# Patient Record
Sex: Female | Born: 1935 | Race: White | Hispanic: No | Smoking: Never smoker
Health system: Southern US, Community
[De-identification: ages and names within clinical notes are randomized; demographics above are authoritative.]

## PROBLEM LIST (undated history)

## (undated) DIAGNOSIS — M1990 Unspecified osteoarthritis, unspecified site: Secondary | ICD-10-CM

## (undated) DIAGNOSIS — J449 Chronic obstructive pulmonary disease, unspecified: Secondary | ICD-10-CM

## (undated) DIAGNOSIS — A809 Acute poliomyelitis, unspecified: Secondary | ICD-10-CM

## (undated) DIAGNOSIS — E785 Hyperlipidemia, unspecified: Secondary | ICD-10-CM

## (undated) DIAGNOSIS — F32A Depression, unspecified: Secondary | ICD-10-CM

## (undated) DIAGNOSIS — M810 Age-related osteoporosis without current pathological fracture: Secondary | ICD-10-CM

## (undated) DIAGNOSIS — H1851 Endothelial corneal dystrophy: Secondary | ICD-10-CM

## (undated) DIAGNOSIS — G43909 Migraine, unspecified, not intractable, without status migrainosus: Secondary | ICD-10-CM

## (undated) DIAGNOSIS — H409 Unspecified glaucoma: Secondary | ICD-10-CM

## (undated) DIAGNOSIS — M419 Scoliosis, unspecified: Secondary | ICD-10-CM

## (undated) DIAGNOSIS — F0390 Unspecified dementia without behavioral disturbance: Secondary | ICD-10-CM

## (undated) DIAGNOSIS — IMO0002 Reserved for concepts with insufficient information to code with codable children: Secondary | ICD-10-CM

## (undated) DIAGNOSIS — H18519 Endothelial corneal dystrophy, unspecified eye: Secondary | ICD-10-CM

## (undated) DIAGNOSIS — M797 Fibromyalgia: Secondary | ICD-10-CM

## (undated) DIAGNOSIS — F329 Major depressive disorder, single episode, unspecified: Secondary | ICD-10-CM

## (undated) HISTORY — DX: Endothelial corneal dystrophy: H18.51

## (undated) HISTORY — DX: Depression, unspecified: F32.A

## (undated) HISTORY — PX: OTHER SURGICAL HISTORY: SHX169

## (undated) HISTORY — DX: Unspecified osteoarthritis, unspecified site: M19.90

## (undated) HISTORY — DX: Acute poliomyelitis, unspecified: A80.9

## (undated) HISTORY — DX: Hyperlipidemia, unspecified: E78.5

## (undated) HISTORY — DX: Unspecified glaucoma: H40.9

## (undated) HISTORY — DX: Fibromyalgia: M79.7

## (undated) HISTORY — DX: Reserved for concepts with insufficient information to code with codable children: IMO0002

## (undated) HISTORY — DX: Chronic obstructive pulmonary disease, unspecified: J44.9

## (undated) HISTORY — DX: Endothelial corneal dystrophy, unspecified eye: H18.519

## (undated) HISTORY — DX: Major depressive disorder, single episode, unspecified: F32.9

## (undated) HISTORY — DX: Unspecified dementia, unspecified severity, without behavioral disturbance, psychotic disturbance, mood disturbance, and anxiety: F03.90

## (undated) HISTORY — DX: Scoliosis, unspecified: M41.9

## (undated) HISTORY — DX: Migraine, unspecified, not intractable, without status migrainosus: G43.909

## (undated) HISTORY — DX: Age-related osteoporosis without current pathological fracture: M81.0

## (undated) HISTORY — PX: TUBAL LIGATION: SHX77

---

## 1998-04-08 ENCOUNTER — Other Ambulatory Visit: Admission: RE | Admit: 1998-04-08 | Discharge: 1998-04-08 | Payer: Self-pay | Admitting: Obstetrics and Gynecology

## 1998-05-26 ENCOUNTER — Ambulatory Visit (HOSPITAL_COMMUNITY): Admission: RE | Admit: 1998-05-26 | Discharge: 1998-05-26 | Payer: Self-pay | Admitting: Obstetrics & Gynecology

## 1998-08-19 ENCOUNTER — Emergency Department (HOSPITAL_COMMUNITY): Admission: EM | Admit: 1998-08-19 | Discharge: 1998-08-19 | Payer: Self-pay

## 1998-09-14 ENCOUNTER — Ambulatory Visit (HOSPITAL_COMMUNITY): Admission: RE | Admit: 1998-09-14 | Discharge: 1998-09-14 | Payer: Self-pay | Admitting: Pediatrics

## 1999-01-24 ENCOUNTER — Encounter: Payer: Self-pay | Admitting: Family Medicine

## 1999-01-24 ENCOUNTER — Encounter: Admission: RE | Admit: 1999-01-24 | Discharge: 1999-01-24 | Payer: Self-pay | Admitting: Family Medicine

## 1999-07-14 ENCOUNTER — Other Ambulatory Visit: Admission: RE | Admit: 1999-07-14 | Discharge: 1999-07-14 | Payer: Self-pay | Admitting: Obstetrics and Gynecology

## 1999-09-20 ENCOUNTER — Encounter: Admission: RE | Admit: 1999-09-20 | Discharge: 1999-09-20 | Payer: Self-pay | Admitting: Family Medicine

## 1999-09-20 ENCOUNTER — Encounter: Payer: Self-pay | Admitting: Family Medicine

## 1999-11-10 ENCOUNTER — Ambulatory Visit (HOSPITAL_COMMUNITY): Admission: RE | Admit: 1999-11-10 | Discharge: 1999-11-10 | Payer: Self-pay | Admitting: Gastroenterology

## 2000-11-26 ENCOUNTER — Other Ambulatory Visit: Admission: RE | Admit: 2000-11-26 | Discharge: 2000-11-26 | Payer: Self-pay | Admitting: Obstetrics and Gynecology

## 2002-01-21 ENCOUNTER — Other Ambulatory Visit: Admission: RE | Admit: 2002-01-21 | Discharge: 2002-01-21 | Payer: Self-pay | Admitting: Obstetrics and Gynecology

## 2002-11-10 ENCOUNTER — Ambulatory Visit (HOSPITAL_COMMUNITY): Admission: RE | Admit: 2002-11-10 | Discharge: 2002-11-10 | Payer: Self-pay | Admitting: Orthopedic Surgery

## 2002-11-10 ENCOUNTER — Encounter: Payer: Self-pay | Admitting: Orthopedic Surgery

## 2003-09-29 ENCOUNTER — Ambulatory Visit (HOSPITAL_COMMUNITY): Admission: RE | Admit: 2003-09-29 | Discharge: 2003-09-29 | Payer: Self-pay | Admitting: Gastroenterology

## 2003-09-29 ENCOUNTER — Encounter (INDEPENDENT_AMBULATORY_CARE_PROVIDER_SITE_OTHER): Payer: Self-pay | Admitting: *Deleted

## 2003-09-29 ENCOUNTER — Encounter: Payer: Self-pay | Admitting: Family Medicine

## 2005-02-23 ENCOUNTER — Emergency Department (HOSPITAL_COMMUNITY): Admission: EM | Admit: 2005-02-23 | Discharge: 2005-02-24 | Payer: Self-pay | Admitting: Emergency Medicine

## 2005-03-05 ENCOUNTER — Encounter: Admission: RE | Admit: 2005-03-05 | Discharge: 2005-03-05 | Payer: Self-pay | Admitting: Orthopedic Surgery

## 2005-07-14 ENCOUNTER — Encounter: Payer: Self-pay | Admitting: Family Medicine

## 2005-09-21 ENCOUNTER — Ambulatory Visit: Payer: Self-pay | Admitting: Family Medicine

## 2006-01-17 ENCOUNTER — Encounter: Payer: Self-pay | Admitting: Family Medicine

## 2006-09-06 ENCOUNTER — Encounter: Payer: Self-pay | Admitting: Family Medicine

## 2006-10-10 DIAGNOSIS — E785 Hyperlipidemia, unspecified: Secondary | ICD-10-CM

## 2006-10-10 DIAGNOSIS — F329 Major depressive disorder, single episode, unspecified: Secondary | ICD-10-CM

## 2007-05-21 ENCOUNTER — Ambulatory Visit: Payer: Self-pay | Admitting: Family Medicine

## 2007-05-21 LAB — CONVERTED CEMR LAB
Bilirubin Urine: NEGATIVE
Glucose, Urine, Semiquant: NEGATIVE
Ketones, urine, test strip: NEGATIVE
Nitrite: NEGATIVE
Protein, U semiquant: NEGATIVE
Specific Gravity, Urine: 1.01
Urobilinogen, UA: 0.2
pH: 7

## 2007-05-27 LAB — CONVERTED CEMR LAB
ALT: 32 units/L (ref 0–35)
AST: 31 units/L (ref 0–37)
Albumin: 4.3 g/dL (ref 3.5–5.2)
Alkaline Phosphatase: 56 units/L (ref 39–117)
BUN: 18 mg/dL (ref 6–23)
Basophils Absolute: 0.1 10*3/uL (ref 0.0–0.1)
Basophils Relative: 0.9 % (ref 0.0–1.0)
Bilirubin, Direct: 0.3 mg/dL (ref 0.0–0.3)
CO2: 29 meq/L (ref 19–32)
Calcium: 10 mg/dL (ref 8.4–10.5)
Chloride: 104 meq/L (ref 96–112)
Cholesterol: 185 mg/dL (ref 0–200)
Creatinine, Ser: 0.8 mg/dL (ref 0.4–1.2)
Eosinophils Absolute: 0.4 10*3/uL (ref 0.0–0.6)
Eosinophils Relative: 6.4 % — ABNORMAL HIGH (ref 0.0–5.0)
GFR calc Af Amer: 90 mL/min
GFR calc non Af Amer: 74 mL/min
Glucose, Bld: 107 mg/dL — ABNORMAL HIGH (ref 70–99)
HCT: 42.7 % (ref 36.0–46.0)
HDL: 60.2 mg/dL (ref >39.0)
Hemoglobin: 14.4 g/dL (ref 12.0–15.0)
LDL Cholesterol: 104 mg/dL — ABNORMAL HIGH (ref 0–99)
Lymphocytes Relative: 23.2 % (ref 12.0–46.0)
MCHC: 33.7 g/dL (ref 30.0–36.0)
MCV: 93.9 fL (ref 78.0–100.0)
Monocytes Absolute: 0.5 10*3/uL (ref 0.2–0.7)
Monocytes Relative: 7.7 % (ref 3.0–11.0)
Neutro Abs: 4.1 10*3/uL (ref 1.4–7.7)
Neutrophils Relative %: 61.8 % (ref 43.0–77.0)
Platelets: 253 10*3/uL (ref 150–400)
Potassium: 4.1 meq/L (ref 3.5–5.1)
RBC: 4.55 M/uL (ref 3.87–5.11)
RDW: 12 % (ref 11.5–14.6)
Sodium: 141 meq/L (ref 135–145)
TSH: 1.34 microintl units/mL (ref 0.35–5.50)
Total Bilirubin: 1.2 mg/dL (ref 0.3–1.2)
Total CHOL/HDL Ratio: 3.1
Total Protein: 7.3 g/dL (ref 6.0–8.3)
Triglycerides: 102 mg/dL (ref 0–149)
VLDL: 20 mg/dL (ref 0–40)
Vit D, 1,25-Dihydroxy: 25 — ABNORMAL LOW (ref 30–89)
WBC: 6.6 10*3/uL (ref 4.5–10.5)

## 2007-08-20 ENCOUNTER — Encounter: Payer: Self-pay | Admitting: Family Medicine

## 2007-08-21 ENCOUNTER — Encounter: Payer: Self-pay | Admitting: Family Medicine

## 2007-08-22 ENCOUNTER — Ambulatory Visit: Payer: Self-pay | Admitting: Family Medicine

## 2007-08-22 ENCOUNTER — Telehealth: Payer: Self-pay | Admitting: Family Medicine

## 2008-08-19 ENCOUNTER — Ambulatory Visit: Payer: Self-pay | Admitting: Family Medicine

## 2008-10-08 ENCOUNTER — Encounter: Payer: Self-pay | Admitting: Family Medicine

## 2008-10-29 ENCOUNTER — Encounter: Payer: Self-pay | Admitting: Family Medicine

## 2009-06-21 ENCOUNTER — Ambulatory Visit: Payer: Self-pay | Admitting: Family Medicine

## 2009-07-07 ENCOUNTER — Encounter: Payer: Self-pay | Admitting: Family Medicine

## 2009-11-11 ENCOUNTER — Encounter: Payer: Self-pay | Admitting: Family Medicine

## 2010-03-27 LAB — CONVERTED CEMR LAB
ALT: 24 units/L (ref 0–35)
AST: 25 units/L (ref 0–37)
Alkaline Phosphatase: 47 units/L (ref 39–117)
BUN: 16 mg/dL (ref 6–23)
Bilirubin Urine: NEGATIVE
Bilirubin, Direct: 0.1 mg/dL (ref 0.0–0.3)
Cholesterol: 182 mg/dL (ref 0–200)
Creatinine, Ser: 0.7 mg/dL (ref 0.4–1.2)
Eosinophils Relative: 4.6 % (ref 0.0–5.0)
GFR calc non Af Amer: 86.15 mL/min (ref >60)
LDL Cholesterol: 95 mg/dL (ref 0–99)
Monocytes Relative: 8.2 % (ref 3.0–12.0)
Neutrophils Relative %: 56.2 % (ref 43.0–77.0)
Nitrite: NEGATIVE
Platelets: 254 10*3/uL (ref 150.0–400.0)
Potassium: 3.9 meq/L (ref 3.5–5.1)
RBC: 4.15 M/uL (ref 3.87–5.11)
TSH: 0.99 microintl units/mL (ref 0.35–5.50)
Total Bilirubin: 1.3 mg/dL — ABNORMAL HIGH (ref 0.3–1.2)
Total CHOL/HDL Ratio: 3
Triglycerides: 106 mg/dL (ref 0.0–149.0)
Urobilinogen, UA: 4
VLDL: 21.2 mg/dL (ref 0.0–40.0)
Vit D, 25-Hydroxy: 21 ng/mL — ABNORMAL LOW (ref 30–89)
WBC: 5 10*3/uL (ref 4.5–10.5)

## 2010-03-31 NOTE — Assessment & Plan Note (Signed)
Summary: lft hip pain/worse when standing and walking/cjr   Vital Signs:  Patient profile:   77 year old female Weight:      144 pounds BMI:     27.31 Temp:     98.6 degrees F oral BP sitting:   96 / 66  (left arm) Cuff size:   regular  Vitals Entered By: Raechel Ache, RN (June 21, 2009 4:06 PM) CC: C/o L hip pain worsening- does water aerobics and maybe "overdid it"   History of Present Illness: Here for one week of left hip pain which is mostly in the lateral and anterior hip areas. She is stiff and sore when she gets out of bed or up out of a chair, but then loosens up and the pain gets better when she moves around. No trauma, but she has been active in water aerobics classes at the [facility] twice a week for several months. She says she works very hard during these classes and possibly pushes herself too much. No back pain. She is already on Voltaren and Lyrica for arthritis pains diffusely.   Allergies (verified): No Known Drug Allergies  Past History:  Past Medical History: Reviewed history from 10/10/2006 and no changes required. Polio - PPS Arthritis Fibromyalgia Depression Hyperlipidemia Ulcers small - stomach High Cholesterol Migraines  Past Surgical History: Reviewed history from 10/10/2006 and no changes required. Inguinal herniorrhaphy Tubal ligation  Review of Systems  The patient denies anorexia, fever, weight loss, weight gain, vision loss, decreased hearing, hoarseness, chest pain, syncope, dyspnea on exertion, peripheral edema, prolonged cough, headaches, hemoptysis, abdominal pain, melena, hematochezia, severe indigestion/heartburn, hematuria, incontinence, genital sores, muscle weakness, suspicious skin lesions, transient blindness, difficulty walking, depression, unusual weight change, abnormal bleeding, enlarged lymph nodes, angioedema, breast masses, and testicular masses.    Physical Exam  General:  Well-developed,well-nourished,in no acute  distress; alert,appropriate and cooperative throughout examination. Gets up and down from the exam table easily. Msk:  No deformity or scoliosis noted of thoracic or lumbar spine.   Extremities:  very tender in the anterior left groin over the hip flexor tendons. The hip has full ROM and very little pain with internal or external rotations.   Impression & Recommendations:  Problem # 1:  HIP PAIN (ICD-719.45)  Her updated medication list for this problem includes:    Adult Aspirin Low Strength 81 Mg Tbdp (Aspirin)    Voltaren 75 Mg Tbec (Diclofenac sodium) .Marland Kitchen... 1 by mouth two times a day after meals for arthritis  Complete Medication List: 1)  Lexapro 20 Mg Tabs (Escitalopram oxalate) 2)  Bl Calcium 600 + Soy 600-200-25 Mg-unit-mg Tabs (Calcium carb-vit d-soy isoflav) 3)  Adult Aspirin Low Strength 81 Mg Tbdp (Aspirin) 4)  Voltaren 75 Mg Tbec (Diclofenac sodium) .Marland Kitchen.. 1 by mouth two times a day after meals for arthritis 5)  Folic Acid 1 Mg Tabs (Folic acid) 6)  Crestor 10 Mg Tabs (Rosuvastatin calcium) 7)  Vitamin D 16109 Unit Caps (Ergocalciferol) .... Take 1 capsule weekly for 12 weeks 8)  Lyrica 75 Mg Caps (Pregabalin) .Marland Kitchen.. 1 bid 9)  Imitrex 50 Mg Tabs (Sumatriptan succinate) .Marland Kitchen.. 1 by mouth at onset of headache 10)  Indapamide 2.5 Mg Tabs (Indapamide) .Marland Kitchen.. 1 once daily  as needed for edema  Patient Instructions: 1)  This is a hip flexor strain, probably related to water aerobics. Advised her to stop all exercising for 2 weeks to let this heal. Then she could resume water aerobics but very slowly and at  a very easy pace. Continue current meds.  2)  Please schedule a follow-up appointment as needed .

## 2010-03-31 NOTE — Miscellaneous (Signed)
Summary: Physical Therapy Initial Evaluation/[facility]   Physical Therapy Initial Evaluation/[facility]   Imported By: Maryln Gottron 10/27/2009 13:51:32  _____________________________________________________________________  External Attachment:    Type:   Image     Comment:   External Document

## 2010-04-17 ENCOUNTER — Inpatient Hospital Stay (HOSPITAL_COMMUNITY)
Admission: EM | Admit: 2010-04-17 | Discharge: 2010-04-19 | DRG: 556 | Disposition: A | Discharge status: Home / Self Care | Payer: MEDICARE | Attending: Internal Medicine | Admitting: Internal Medicine

## 2010-04-17 ENCOUNTER — Emergency Department (HOSPITAL_COMMUNITY): Payer: MEDICARE

## 2010-04-17 DIAGNOSIS — Z8612 Personal history of poliomyelitis: Secondary | ICD-10-CM

## 2010-04-17 DIAGNOSIS — Z66 Do not resuscitate: Secondary | ICD-10-CM | POA: Diagnosis present

## 2010-04-17 DIAGNOSIS — R209 Unspecified disturbances of skin sensation: Secondary | ICD-10-CM | POA: Diagnosis present

## 2010-04-17 DIAGNOSIS — F3289 Other specified depressive episodes: Secondary | ICD-10-CM | POA: Diagnosis present

## 2010-04-17 DIAGNOSIS — M810 Age-related osteoporosis without current pathological fracture: Secondary | ICD-10-CM | POA: Diagnosis present

## 2010-04-17 DIAGNOSIS — N390 Urinary tract infection, site not specified: Secondary | ICD-10-CM | POA: Diagnosis present

## 2010-04-17 DIAGNOSIS — I10 Essential (primary) hypertension: Secondary | ICD-10-CM | POA: Diagnosis present

## 2010-04-17 DIAGNOSIS — G609 Hereditary and idiopathic neuropathy, unspecified: Secondary | ICD-10-CM | POA: Diagnosis present

## 2010-04-17 DIAGNOSIS — H409 Unspecified glaucoma: Secondary | ICD-10-CM | POA: Diagnosis present

## 2010-04-17 DIAGNOSIS — R29898 Other symptoms and signs involving the musculoskeletal system: Principal | ICD-10-CM | POA: Diagnosis present

## 2010-04-17 DIAGNOSIS — F329 Major depressive disorder, single episode, unspecified: Secondary | ICD-10-CM | POA: Diagnosis present

## 2010-04-17 LAB — HEMOGLOBIN A1C: Mean Plasma Glucose: 131 mg/dL — ABNORMAL HIGH (ref <117)

## 2010-04-17 LAB — URINALYSIS, ROUTINE W REFLEX MICROSCOPIC
Bilirubin Urine: NEGATIVE
Hgb urine dipstick: NEGATIVE
Specific Gravity, Urine: 1.008 (ref 1.005–1.030)
Urine Glucose, Fasting: NEGATIVE mg/dL

## 2010-04-17 LAB — HEPATIC FUNCTION PANEL
AST: 23 U/L (ref 0–37)
Bilirubin, Direct: 0.1 mg/dL (ref 0.0–0.3)
Indirect Bilirubin: 0.4 mg/dL (ref 0.3–0.9)
Total Bilirubin: 0.5 mg/dL (ref 0.3–1.2)

## 2010-04-17 LAB — PROTIME-INR
INR: 1.14 (ref 0.00–1.49)
Prothrombin Time: 14.8 seconds (ref 11.6–15.2)

## 2010-04-17 LAB — DIFFERENTIAL
Basophils Absolute: 0 10*3/uL (ref 0.0–0.1)
Lymphocytes Relative: 31 % (ref 12–46)
Lymphs Abs: 1.8 10*3/uL (ref 0.7–4.0)
Neutro Abs: 3.3 10*3/uL (ref 1.7–7.7)
Neutrophils Relative %: 58 % (ref 43–77)

## 2010-04-17 LAB — URINE MICROSCOPIC-ADD ON

## 2010-04-17 LAB — BASIC METABOLIC PANEL
Calcium: 9.7 mg/dL (ref 8.4–10.5)
Creatinine, Ser: 0.69 mg/dL (ref 0.4–1.2)
GFR calc Af Amer: >60 mL/min (ref >60)
GFR calc non Af Amer: >60 mL/min (ref >60)

## 2010-04-17 LAB — POCT CARDIAC MARKERS
Myoglobin, poc: 110 ng/mL (ref 12–200)
Troponin i, poc: <0.05 ng/mL (ref 0.00–0.09)

## 2010-04-17 LAB — CBC
HCT: 43.4 % (ref 36.0–46.0)
Hemoglobin: 14.7 g/dL (ref 12.0–15.0)
RBC: 4.56 MIL/uL (ref 3.87–5.11)

## 2010-04-17 LAB — CK TOTAL AND CKMB (NOT AT ARMC)
CK, MB: 6.1 ng/mL (ref 0.3–4.0)
Total CK: 157 U/L (ref 7–177)

## 2010-04-18 ENCOUNTER — Inpatient Hospital Stay (HOSPITAL_COMMUNITY): Payer: MEDICARE

## 2010-04-18 DIAGNOSIS — R4789 Other speech disturbances: Secondary | ICD-10-CM

## 2010-04-18 DIAGNOSIS — G459 Transient cerebral ischemic attack, unspecified: Secondary | ICD-10-CM

## 2010-04-18 MED ORDER — GADOBENATE DIMEGLUMINE 529 MG/ML IV SOLN: 15.0000 mL | Freq: Once | INTRAVENOUS | Status: AC | PRN
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
## 2010-04-19 LAB — COMPREHENSIVE METABOLIC PANEL
Albumin: 3.3 g/dL — ABNORMAL LOW (ref 3.5–5.2)
Alkaline Phosphatase: 57 U/L (ref 39–117)
BUN: 16 mg/dL (ref 6–23)
Creatinine, Ser: 0.66 mg/dL (ref 0.4–1.2)
Potassium: 3.6 mEq/L (ref 3.5–5.1)
Total Protein: 6.3 g/dL (ref 6.0–8.3)

## 2010-04-19 LAB — CBC
MCV: 94.5 fL (ref 78.0–100.0)
Platelets: 250 10*3/uL (ref 150–400)
RDW: 12.3 % (ref 11.5–15.5)
WBC: 8.6 10*3/uL (ref 4.0–10.5)

## 2010-04-19 LAB — VITAMIN B12: Vitamin B-12: 247 pg/mL (ref 211–911)

## 2010-04-19 LAB — URINE CULTURE: Culture  Setup Time: 201202191949

## 2010-04-19 LAB — RPR: RPR Ser Ql: NONREACTIVE

## 2010-04-20 LAB — FOLATE RBC: RBC Folate: 364 ng/mL (ref 180–600)
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
## 2010-05-09 ENCOUNTER — Encounter: Payer: Self-pay | Admitting: Family Medicine

## 2010-05-19 ENCOUNTER — Ambulatory Visit (INDEPENDENT_AMBULATORY_CARE_PROVIDER_SITE_OTHER)
Admission: RE | Admit: 2010-05-19 | Discharge: 2010-05-19 | Disposition: A | Discharge status: Home / Self Care | Payer: MEDICARE | Source: Ambulatory Visit | Attending: Family Medicine | Admitting: Family Medicine

## 2010-05-19 ENCOUNTER — Encounter: Payer: Self-pay | Admitting: Family Medicine

## 2010-05-19 ENCOUNTER — Ambulatory Visit (INDEPENDENT_AMBULATORY_CARE_PROVIDER_SITE_OTHER): Payer: MEDICARE | Admitting: Family Medicine

## 2010-05-19 VITALS — BP 124/74 | HR 89 | Temp 98.6°F | Ht 61.5 in | Wt 140.0 lb

## 2010-05-19 DIAGNOSIS — M25521 Pain in right elbow: Secondary | ICD-10-CM

## 2010-05-19 DIAGNOSIS — G459 Transient cerebral ischemic attack, unspecified: Secondary | ICD-10-CM

## 2010-05-19 DIAGNOSIS — E876 Hypokalemia: Secondary | ICD-10-CM

## 2010-05-19 DIAGNOSIS — N390 Urinary tract infection, site not specified: Secondary | ICD-10-CM

## 2010-05-19 DIAGNOSIS — M25529 Pain in unspecified elbow: Secondary | ICD-10-CM

## 2010-05-19 DIAGNOSIS — E559 Vitamin D deficiency, unspecified: Secondary | ICD-10-CM

## 2010-05-19 DIAGNOSIS — D649 Anemia, unspecified: Secondary | ICD-10-CM

## 2010-05-19 DIAGNOSIS — E785 Hyperlipidemia, unspecified: Secondary | ICD-10-CM

## 2010-05-19 DIAGNOSIS — E039 Hypothyroidism, unspecified: Secondary | ICD-10-CM

## 2010-05-19 DIAGNOSIS — G14 Postpolio syndrome: Secondary | ICD-10-CM

## 2010-05-19 DIAGNOSIS — B91 Sequelae of poliomyelitis: Secondary | ICD-10-CM

## 2010-05-19 DIAGNOSIS — H409 Unspecified glaucoma: Secondary | ICD-10-CM

## 2010-05-19 LAB — CBC WITH DIFFERENTIAL/PLATELET
Basophils Relative: 0.7 % (ref 0.0–3.0)
Eosinophils Absolute: 0.2 10*3/uL (ref 0.0–0.7)
Eosinophils Relative: 3.1 % (ref 0.0–5.0)
Hemoglobin: 13.5 g/dL (ref 12.0–15.0)
Lymphocytes Relative: 27.4 % (ref 12.0–46.0)
MCHC: 34.5 g/dL (ref 30.0–36.0)
MCV: 95.1 fl (ref 78.0–100.0)
Neutro Abs: 3.2 10*3/uL (ref 1.4–7.7)
Neutrophils Relative %: 61.3 % (ref 43.0–77.0)
RBC: 4.12 Mil/uL (ref 3.87–5.11)
WBC: 5.2 10*3/uL (ref 4.5–10.5)

## 2010-05-19 LAB — HEPATIC FUNCTION PANEL
Bilirubin, Direct: 0.1 mg/dL (ref 0.0–0.3)
Total Bilirubin: 1.1 mg/dL (ref 0.3–1.2)
Total Protein: 6.6 g/dL (ref 6.0–8.3)

## 2010-05-19 LAB — LIPID PANEL
HDL: 66 mg/dL (ref >39.00)
LDL Cholesterol: 84 mg/dL (ref 0–99)
Total CHOL/HDL Ratio: 3
Triglycerides: 109 mg/dL (ref 0.0–149.0)

## 2010-05-19 LAB — BASIC METABOLIC PANEL
Calcium: 9.1 mg/dL (ref 8.4–10.5)
Creatinine, Ser: 0.5 mg/dL (ref 0.4–1.2)
GFR: 118.22 mL/min (ref >60.00)
Sodium: 140 mEq/L (ref 135–145)

## 2010-05-19 LAB — POCT URINALYSIS DIPSTICK
Glucose, UA: NEGATIVE
Spec Grav, UA: 1.02

## 2010-05-19 MED ORDER — ESCITALOPRAM OXALATE 20 MG PO TABS: 20.0000 mg | ORAL_TABLET | Freq: Every day | ORAL | Status: DC

## 2010-05-19 MED ORDER — DICLOFENAC SODIUM 75 MG PO TBEC: 75.0000 mg | DELAYED_RELEASE_TABLET | Freq: Two times a day (BID) | ORAL | Status: DC

## 2010-05-19 MED ORDER — IBANDRONATE SODIUM 150 MG PO TABS: 150.0000 mg | ORAL_TABLET | ORAL | Status: DC

## 2010-05-19 MED ORDER — SUMATRIPTAN SUCCINATE 50 MG PO TABS: 50.0000 mg | ORAL_TABLET | Freq: Every day | ORAL | Status: DC

## 2010-05-19 MED ORDER — INDAPAMIDE 2.5 MG PO TABS: 2.5000 mg | ORAL_TABLET | ORAL | Status: DC

## 2010-05-21 MED ORDER — CIPROFLOXACIN HCL 500 MG PO TABS: 500.0000 mg | ORAL_TABLET | Freq: Two times a day (BID) | ORAL | Status: AC
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
## 2010-05-22 NOTE — Patient Instructions (Signed)
My impression is that you're doing very well however you should be very cautious in regard to falling since you have fallen so many times here do not hesitate to use her cane and walker Did not climb ladders or get on a stool We'll call you the results of your lab studies as well as refill your medications
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
## 2010-06-17 ENCOUNTER — Other Ambulatory Visit (INDEPENDENT_AMBULATORY_CARE_PROVIDER_SITE_OTHER): Payer: MEDICARE | Admitting: Family Medicine

## 2010-06-17 DIAGNOSIS — N390 Urinary tract infection, site not specified: Secondary | ICD-10-CM

## 2010-06-17 LAB — POCT URINALYSIS DIPSTICK
Leukocytes, UA: NEGATIVE
Nitrite, UA: NEGATIVE
Urobilinogen, UA: 2
pH, UA: 6.5

## 2010-06-20 NOTE — Progress Notes (Signed)
Pt aware.

## 2010-06-28 ENCOUNTER — Encounter: Payer: Self-pay | Admitting: Family Medicine

## 2010-07-15 NOTE — Op Note (Signed)
[name], [name]                            ACCOUNT NO.:  [id number]   MEDICAL RECORD NO.:  [id number]                   PATIENT TYPE:  AMB   LOCATION:  ENDO                                 FACILITY:  [facility]   PHYSICIAN:  James L. Malon Kindle., M.D.          DATE OF BIRTH:  1929-11-10   DATE OF PROCEDURE:  09/29/2003  DATE OF DISCHARGE:                                 OPERATIVE REPORT   PROCEDURE:  Colonoscopy with biopsy.   MEDICATIONS:  Fentanyl 50 mcg, Versed 6 mg IV.   SCOPE:  Olympus pediatric colonoscope.   INDICATIONS FOR PROCEDURE:  A 77 year old without previous colonoscopic  evaluation who has had problems with recent onset of diarrhea, previously  been constipated and had multiple loose stools __________ has been nearly  incontinence but without actual bleeding.   DESCRIPTION OF PROCEDURE:  The procedure had been explained to the patient  and consent obtained. With the patient in the left lateral decubitus  position, the pediatric adjustable colonoscope was inserted and advance. We  were able to advance through an area in the sigmoid colon with fairly marked  diverticular disease. After we were able to advance pass this, the  colonoscope was advanced fairly rapidly to the cecum. The ileocecal valve  and appendiceal orifice were seen.  The scope was withdrawn and the cecum,  ascending colon, transverse colon and descending colon were seen well.  Multiple random biopsies were taken to look for microscopic colitis.  Again  extensive diverticular disease in the sigmoid colon but no polyps or other  lesions were seen. The rectum was free of polyps.  The scope was withdrawn.  The patient tolerated the procedure well. She was monitored, given  supplemental oxygen throughout.   ASSESSMENT:  1. Diarrhea of unclear cause.  2. Extensive diverticular disease.   PLAN:  Will check path and see back in the office in 4-6 weeks.     James L. Malon Kindle., M.D.    Waldron Session  D:  09/29/2003  T:  09/29/2003  Job:  [id number]   cc:   Sherry A. Rosalio Macadamia, M.D.  [address]  [place]  [place] [address]  Fax: [phone]   Ellin Saba., M.D.  [address] [place]  [place] [address]  Fax: [phone]

## 2010-07-15 NOTE — Procedures (Signed)
[place]  Patient:    Becky [name], Becky [name]                         MRN: [id number] Proc. Date: 11/10/99 Adm. Date:  [id number] Disc. Date: [id number] Attending:  Orland Mustard CC:         Zigmund Daniel, M.D.  Feliciana Rossetti, M.D.   Procedure Report  PROCEDURE:  Esophagogastroduodenoscopy and biopsy.  MEDICATIONS:  Hurricaine spray, fentanyl 60 mcg, Versed 6 mg IV.  INDICATION:  This patient has been found to have some gallbladder sludge has been set up to have gallbladder surgery.  Her symptoms have been somewhat atypical.  She has been on Relafen was not having any indigestion or heartburn and has clear dyspepsia.  DESCRIPTION OF PROCEDURE:  The procedure had been explained to the patient and consent obtained.  With the patient in the left lateral decubitus position, the Olympus video endoscope inserted blindly into the esophagus and advanced under direct visualization.  The stomach was entered.  There was a 0.5 cm prepyloric ulcer.  It was not actively bleeding and was quite shallow.  The duodenum was completely normal.  The scope was withdrawn back and the prepyloric ulcer identified.  There was streaky gastritis and erosions and inflammation in the antrum.  A biopsy was taken for rapid urease test for Helicobacter.  No other ulcers were seen.  The fundus and the cardia were seen well on the retroflex view and were found to be normal.  There was a hiatal hernia and two small erosions in the esophagus, slight redness consistent with mild esophagitis.  The proximal esophagus was normal.  The patient tolerated the procedure well, was maintained on low flow oxygen and pulse oximetry throughout the procedure with no obvious problem.  ASSESSMENT: 1. Gastric ulcer and erosions probably contributed to by the Relafen. 2. Ulcerative esophagitis.  PLAN:  Will check the CLOtest.  Will go ahead and give her Nexium and have her follow up in the  office in two months following her gallbladder surgery. DD:  11/10/99 TD:  11/11/99 Job: [id number]

## 2010-08-29 ENCOUNTER — Other Ambulatory Visit: Payer: Self-pay | Admitting: Family Medicine

## 2010-10-07 ENCOUNTER — Other Ambulatory Visit: Payer: Self-pay | Admitting: Family Medicine

## 2010-12-07 LAB — HM MAMMOGRAPHY: HM Mammogram: NORMAL

## 2011-04-28 ENCOUNTER — Encounter: Payer: Self-pay | Admitting: Family Medicine

## 2011-05-20 ENCOUNTER — Other Ambulatory Visit: Payer: Self-pay | Admitting: Family Medicine

## 2011-05-23 NOTE — Telephone Encounter (Signed)
Refill once only.  Needs new primary.

## 2011-05-23 NOTE — Telephone Encounter (Signed)
Pt has not established with new pcp.  Pt last seen 05/19/10.  Rx last filled the same day.  Pls advise.

## 2011-06-12 ENCOUNTER — Other Ambulatory Visit: Payer: Self-pay | Admitting: Family Medicine

## 2011-09-11 ENCOUNTER — Encounter: Payer: Self-pay | Admitting: Family

## 2011-09-11 ENCOUNTER — Ambulatory Visit (INDEPENDENT_AMBULATORY_CARE_PROVIDER_SITE_OTHER): Payer: Medicare Other | Admitting: Family

## 2011-09-11 VITALS — BP 102/74 | HR 64 | Wt 143.0 lb

## 2011-09-11 DIAGNOSIS — Z79899 Other long term (current) drug therapy: Secondary | ICD-10-CM

## 2011-09-11 DIAGNOSIS — M1990 Unspecified osteoarthritis, unspecified site: Secondary | ICD-10-CM

## 2011-09-11 DIAGNOSIS — F329 Major depressive disorder, single episode, unspecified: Secondary | ICD-10-CM

## 2011-09-11 DIAGNOSIS — E039 Hypothyroidism, unspecified: Secondary | ICD-10-CM

## 2011-09-11 DIAGNOSIS — F3289 Other specified depressive episodes: Secondary | ICD-10-CM

## 2011-09-11 DIAGNOSIS — M810 Age-related osteoporosis without current pathological fracture: Secondary | ICD-10-CM

## 2011-09-11 DIAGNOSIS — E7800 Pure hypercholesterolemia, unspecified: Secondary | ICD-10-CM

## 2011-09-11 LAB — LDL CHOLESTEROL, DIRECT: Direct LDL: 237.9 mg/dL

## 2011-09-11 LAB — LIPID PANEL: VLDL: 36.4 mg/dL (ref 0.0–40.0)

## 2011-09-11 LAB — BASIC METABOLIC PANEL
BUN: 14 mg/dL (ref 6–23)
Calcium: 9.3 mg/dL (ref 8.4–10.5)
Chloride: 106 mEq/L (ref 96–112)
Creatinine, Ser: 0.6 mg/dL (ref 0.4–1.2)
GFR: 100.18 mL/min (ref >60.00)

## 2011-09-11 MED ORDER — IBANDRONATE SODIUM 150 MG PO TABS: 150.0000 mg | ORAL_TABLET | ORAL | Status: DC

## 2011-09-11 NOTE — Patient Instructions (Addendum)

## 2011-09-11 NOTE — Progress Notes (Signed)
Subjective:    Patient ID: Becky [name], female    DOB: Jun 07, 1929, 78 y.o.   MRN: [id number]  HPI 78 year old WF is in today for a recheck of hypothyroidism, hyperlipidemia, depression, osteopenia, and osteoarthritis. Patient has stopped taking her cholesterol medicine and Boniva. She has a history of a fractured femur. She is currently taking Zoloft and doing well.    Review of Systems  Constitutional: Negative.   HENT: Negative.   Respiratory: Negative.   Cardiovascular: Negative.   Gastrointestinal: Negative.   Genitourinary: Negative.   Musculoskeletal: Positive for arthralgias.  Neurological: Negative.  Negative for headaches.  Hematological: Negative.   Psychiatric/Behavioral: Negative.    Past Medical History  Diagnosis Date  . Polio     PPS  . Arthritis   . Fibromyalgia   . Depression   . Hyperlipidemia   . Ulcer     small- stomach  . Migraines     History   Social History  . Marital Status: Divorced    Spouse Name: N/A    Number of Children: N/A  . Years of Education: N/A   Occupational History  . Not on file.   Social History Main Topics  . Smoking status: Never Smoker   . Smokeless tobacco: Never Used  . Alcohol Use: No  . Drug Use: No  . Sexually Active: No   Other Topics Concern  . Not on file   Social History Narrative  . No narrative on file    Past Surgical History  Procedure Date  . Inguinal herniorrhaphy   . Tubal ligation     Family History  Problem Relation Age of Onset  . Alcohol abuse      addiction   . Arthritis    . Colon cancer      1st degree relative <60  . Diabetes      1st degree relative  . Psychosis    . Arthritis Brother   . Cancer Brother     No Known Allergies  Current Outpatient Prescriptions on File Prior to Visit  Medication Sig Dispense Refill  . acetaminophen (TYLENOL) 500 MG tablet Take 500 mg by mouth every 6 (six) hours as needed.        Marland Kitchen aspirin 81 MG tablet Take 81 mg by mouth daily.         . Calcium Carb-Vit D-Soy Isoflav 600-200-25 MG-UNIT-MG TABS Take by mouth.        . carboxymethylcellulose (REFRESH PLUS) 0.5 % SOLN 1 drop.        . diclofenac (VOLTAREN) 75 MG EC tablet Take 1 tablet (75 mg total) by mouth 2 (two) times daily.  30 tablet  11  . ergocalciferol (VITAMIN D2) 50000 UNITS capsule Take 50,000 Units by mouth once a week. For 12 weeks        . mirtazapine (REMERON) 30 MG tablet Take 30 mg by mouth at bedtime.      . sertraline (ZOLOFT) 100 MG tablet Take 100 mg by mouth daily.      . travoprost, benzalkonium, (TRAVATAN) 0.004 % ophthalmic solution Place 1 drop into the right eye at bedtime.        . Ascorbic Acid (VITAMIN C) 500 MG tablet Take 500 mg by mouth daily.        Marland Kitchen aspirin 81 MG EC tablet Take 81 mg by mouth daily.        . indapamide (LOZOL) 2.5 MG tablet TAKE 1 TABLET BY MOUTH EVERY MORNING  AS NEEDED FOR EDEMA  30 tablet  0  . NON FORMULARY Restall; otc used for sleep, qhs       . pregabalin (LYRICA) 75 MG capsule Take 75 mg by mouth 2 (two) times daily.        . rivastigmine (EXELON) 4.6 mg/24hr Place 1 patch onto the skin daily.        . rosuvastatin (CRESTOR) 10 MG tablet Take 10 mg by mouth daily.        . SUMAtriptan (IMITREX) 50 MG tablet Take 1 tablet (50 mg total) by mouth daily. At onset of a headache  9 tablet  11    BP 102/74  Pulse 64  Wt 143 lb (64.864 kg)  SpO2 95%chart    Objective:   Physical Exam  Constitutional: She is oriented to person, place, and time. She appears well-developed and well-nourished.  HENT:  Right Ear: External ear normal.  Left Ear: External ear normal.  Nose: Nose normal.  Mouth/Throat: Oropharynx is clear and moist.  Neck: Normal range of motion. Neck supple.  Cardiovascular: Normal rate, regular rhythm and normal heart sounds.   Pulmonary/Chest: Effort normal and breath sounds normal.  Abdominal: Soft. Bowel sounds are normal.  Musculoskeletal: Normal range of motion.  Neurological: She is alert and  oriented to person, place, and time.  Skin: Skin is warm and dry.  Psychiatric: She has a normal mood and affect.          Assessment & Plan:  Assessment: Hypothyroidism, Osteoporosis, Depression, Osteoarthritis  Plan: Restart Boniva. BMP, lipids, TSH. Recheck patient pending results. Continue current meds.

## 2011-09-12 ENCOUNTER — Other Ambulatory Visit: Payer: Self-pay

## 2011-09-12 DIAGNOSIS — E785 Hyperlipidemia, unspecified: Secondary | ICD-10-CM

## 2011-09-12 MED ORDER — ROSUVASTATIN CALCIUM 10 MG PO TABS: 10.0000 mg | ORAL_TABLET | Freq: Every day | ORAL | Status: DC

## 2011-09-22 ENCOUNTER — Other Ambulatory Visit: Payer: Self-pay | Admitting: Family Medicine

## 2011-10-11 ENCOUNTER — Telehealth: Payer: Self-pay | Admitting: Family Medicine

## 2011-10-11 NOTE — Telephone Encounter (Signed)
Pt requesting refills.  Last seen to establish care with you on 09/11/11.  Please advise.

## 2011-10-12 ENCOUNTER — Other Ambulatory Visit: Payer: Self-pay | Admitting: Family Medicine

## 2011-10-12 MED ORDER — MIRTAZAPINE 30 MG PO TABS: 30.0000 mg | ORAL_TABLET | Freq: Every day | ORAL | Status: DC

## 2011-10-12 NOTE — Telephone Encounter (Signed)
Sent to the pharmacy by e-scribe. 

## 2011-10-12 NOTE — Telephone Encounter (Signed)
Ok to refills. 

## 2011-10-24 ENCOUNTER — Other Ambulatory Visit (INDEPENDENT_AMBULATORY_CARE_PROVIDER_SITE_OTHER): Payer: Medicare Other

## 2011-10-24 DIAGNOSIS — E785 Hyperlipidemia, unspecified: Secondary | ICD-10-CM

## 2011-10-24 LAB — LIPID PANEL
Cholesterol: 205 mg/dL — ABNORMAL HIGH (ref 0–200)
HDL: 69.1 mg/dL (ref >39.00)
VLDL: 27.2 mg/dL (ref 0.0–40.0)

## 2011-10-26 NOTE — Progress Notes (Signed)
Quick Note:  Attempted to call pt; will attempt again at a later time. ______

## 2011-10-31 NOTE — Progress Notes (Signed)
Quick Note:  Called and spoke with pt and pt is aware. ______ 

## 2012-03-06 ENCOUNTER — Ambulatory Visit: Payer: Medicare Other | Admitting: Family

## 2012-03-13 ENCOUNTER — Ambulatory Visit: Payer: Medicare Other | Admitting: Family

## 2012-03-20 ENCOUNTER — Encounter: Payer: Self-pay | Admitting: Family

## 2012-03-20 ENCOUNTER — Ambulatory Visit (INDEPENDENT_AMBULATORY_CARE_PROVIDER_SITE_OTHER): Payer: Medicare Other | Admitting: Family

## 2012-03-20 VITALS — BP 118/78 | HR 95 | Wt 136.0 lb

## 2012-03-20 DIAGNOSIS — E785 Hyperlipidemia, unspecified: Secondary | ICD-10-CM

## 2012-03-20 DIAGNOSIS — R413 Other amnesia: Secondary | ICD-10-CM

## 2012-03-20 DIAGNOSIS — F329 Major depressive disorder, single episode, unspecified: Secondary | ICD-10-CM

## 2012-03-20 DIAGNOSIS — M810 Age-related osteoporosis without current pathological fracture: Secondary | ICD-10-CM

## 2012-03-20 LAB — CBC WITH DIFFERENTIAL/PLATELET
Eosinophils Absolute: 0.3 10*3/uL (ref 0.0–0.7)
Eosinophils Relative: 3.4 % (ref 0.0–5.0)
MCHC: 33.9 g/dL (ref 30.0–36.0)
MCV: 93.7 fl (ref 78.0–100.0)
Monocytes Absolute: 0.5 10*3/uL (ref 0.1–1.0)
Neutrophils Relative %: 64.6 % (ref 43.0–77.0)
Platelets: 261 10*3/uL (ref 150.0–400.0)
WBC: 7.7 10*3/uL (ref 4.5–10.5)

## 2012-03-20 LAB — BASIC METABOLIC PANEL
GFR: 101.98 mL/min (ref >60.00)
Potassium: 4 mEq/L (ref 3.5–5.1)
Sodium: 143 mEq/L (ref 135–145)

## 2012-03-20 LAB — POCT URINALYSIS DIPSTICK: Urobilinogen, UA: 2

## 2012-03-20 LAB — LIPID PANEL
LDL Cholesterol: 100 mg/dL — ABNORMAL HIGH (ref 0–99)
VLDL: 30.8 mg/dL (ref 0.0–40.0)

## 2012-03-20 LAB — HEPATIC FUNCTION PANEL
AST: 18 U/L (ref 0–37)
Alkaline Phosphatase: 59 U/L (ref 39–117)
Bilirubin, Direct: 0.1 mg/dL (ref 0.0–0.3)
Total Bilirubin: 0.7 mg/dL (ref 0.3–1.2)

## 2012-03-20 LAB — TSH: TSH: 0.26 u[IU]/mL — ABNORMAL LOW (ref 0.35–5.50)

## 2012-03-20 NOTE — Progress Notes (Signed)
Subjective:    Patient ID: Becky [name], female    DOB: [birth year], 79 y.o.   MRN: [id number]  HPI 79 year old white female, nonsmoker is in with complaints of worsening memory loss. She's a former patient of Dr. Scotty Court. She has been taking Aricept for approximately a year. She has discontinued taking the Exelon patch. She's worried that she's become more forgetful more recently over the last 6 months. Her short-term memory is being affected. No long-term memory concerns. She denies any lightheadedness, dizziness, headaches, blurred vision, double vision, chest pain or palpitations. Mother had Alzheimer's dementia.  Patient also has a history of depression. She's currently on Zoloft 100 mg once a day. In the past, she's been on 200 mg. Her daughter reports her medications becoming confused over the Christmas holidays and patient didn't get some of her medications filled. She is now back on all of her prescribed medications except Exelon. Has feelings of sadness. Denies any feelings of helplessness, hopelessness, thoughts of death or dying. No thoughts of suicide.  Review of Systems  HENT: Negative.   Respiratory: Negative.   Cardiovascular: Negative.   Gastrointestinal: Negative.   Musculoskeletal: Negative.   Skin: Negative.   Neurological: Negative.   Hematological: Negative.   Psychiatric/Behavioral: Positive for confusion and agitation.   Past Medical History  Diagnosis Date  . Polio     PPS  . Arthritis   . Fibromyalgia   . Depression   . Hyperlipidemia   . Ulcer     small- stomach  . Migraines     History   Social History  . Marital Status: Divorced    Spouse Name: N/A    Number of Children: N/A  . Years of Education: N/A   Occupational History  . Not on file.   Social History Main Topics  . Smoking status: Never Smoker   . Smokeless tobacco: Never Used  . Alcohol Use: No  . Drug Use: No  . Sexually Active: No   Other Topics Concern  . Not on file    Social History Narrative  . No narrative on file    Past Surgical History  Procedure Date  . Inguinal herniorrhaphy   . Tubal ligation     Family History  Problem Relation Age of Onset  . Alcohol abuse      addiction   . Arthritis    . Colon cancer      1st degree relative <60  . Diabetes      1st degree relative  . Psychosis    . Arthritis Brother   . Cancer Brother     No Known Allergies  Current Outpatient Prescriptions on File Prior to Visit  Medication Sig Dispense Refill  . donepezil (ARICEPT) 10 MG tablet Take 10 mg by mouth at bedtime as needed.      . indapamide (LOZOL) 2.5 MG tablet TAKE 1 TABLET BY MOUTH EVERY MORNING AS NEEDED FOR EDEMA  30 tablet  0  . mirtazapine (REMERON) 30 MG tablet Take 1 tablet (30 mg total) by mouth at bedtime.  30 tablet  0  . rosuvastatin (CRESTOR) 10 MG tablet Take 1 tablet (10 mg total) by mouth daily.  90 tablet  0  . sertraline (ZOLOFT) 100 MG tablet Take 100 mg by mouth daily.      . SUMAtriptan (IMITREX) 50 MG tablet Take 1 tablet (50 mg total) by mouth daily. At onset of a headache  9 tablet  11  . acetaminophen (TYLENOL) 500  MG tablet Take 500 mg by mouth every 6 (six) hours as needed.        . Ascorbic Acid (VITAMIN C) 500 MG tablet Take 500 mg by mouth daily.        Marland Kitchen aspirin 81 MG EC tablet Take 81 mg by mouth daily.        Marland Kitchen aspirin 81 MG tablet Take 81 mg by mouth daily.        . Calcium Carb-Vit D-Soy Isoflav 600-200-25 MG-UNIT-MG TABS Take by mouth.        . carboxymethylcellulose (REFRESH PLUS) 0.5 % SOLN 1 drop.        . diclofenac (VOLTAREN) 75 MG EC tablet Take 1 tablet (75 mg total) by mouth 2 (two) times daily.  30 tablet  11  . ergocalciferol (VITAMIN D2) 50000 UNITS capsule Take 50,000 Units by mouth once a week. For 12 weeks        . ibandronate (BONIVA) 150 MG tablet Take 1 tablet (150 mg total) by mouth every 30 (thirty) days. Take in the morning with a full glass of water, on an empty stomach, and do not  take anything else by mouth or lie down for the next 30 min.  1 tablet  11  . NON FORMULARY Restall; otc used for sleep, qhs       . pregabalin (LYRICA) 75 MG capsule Take 75 mg by mouth 2 (two) times daily.        . rivastigmine (EXELON) 4.6 mg/24hr Place 1 patch onto the skin daily.        . travoprost, benzalkonium, (TRAVATAN) 0.004 % ophthalmic solution Place 1 drop into the right eye at bedtime.          BP 118/78  Pulse 95  Wt 136 lb (61.689 kg)  SpO2 98%chart    Objective:   Physical Exam  Constitutional: She appears well-developed and well-nourished.  Neck: Normal range of motion. Neck supple. No thyromegaly present.  Cardiovascular: Normal rate, regular rhythm and normal heart sounds.   Pulmonary/Chest: Effort normal and breath sounds normal.  Abdominal: Soft. Bowel sounds are normal.  Musculoskeletal: Normal range of motion.  Skin: Skin is warm and dry.  Psychiatric: She has a normal mood and affect.     Clock draw test: pass Memory Recall: pass     Assessment & Plan:  Assessment:   1.Memory loss 2. Hyperlipidemia 3. Depression  Plan: Lab sent to include TSH, CBC, B12, BMP, LFTs, and lipids will notify patient pending results. Encourage crossword puzzles and reading to exercise the brain. We'll consider a referral to neurology if her test results are normal.

## 2012-03-20 NOTE — Patient Instructions (Addendum)
Alzheimer's Disease (AD), Diagnosis There are many conditions that can impair thinking and memory. There is no one specific test or group of tests for diagnosing Alzheimer's. Instead, the disease is diagnosed by symptoms, tests of the neurologic system, and results from diagnostic tests. These tests help exclude other conditions that might cause similar symptoms. When caregivers do detailed testing of thinking and memory skills, it may take several hours to complete. These tests can help detect Alzheimer's and other dementias at an early stage. Caregivers must make a diagnosis of "possible" or "probable" Alzheimer's disease. The evaluation may include:  A complete medical history, including information about:  The patient's general health.  Past medical problems.  Any difficulties the person has carrying out daily activities.  Medical tests, such as tests of:  Blood.  Urine.  Spinal fluid. These help the caregiver find other possible diseases causing the symptoms.   Neuropsychological tests to measure:  Memory.  Problem solving.  Attention.  Counting.  Language.  Brain scans allow the caregiver to look at a picture of the brain to see if anything does not look normal.  Information from the medical history and test results help the caregiver to rule out other possible causes of the patient's symptoms. For example, the following conditions can cause Alzheimer's-like symptoms:  Thyroid problems.  Drug reactions.Depression.  Brain tumors.  Blood vessel disease in the brain. Some of these other conditions can be treated successfully. WHAT IS THE OUTLOOK FOR SOMEONE DIAGNOSED WITH AD? The course the disease takes and how quickly changes happen vary from person to person. Lifespan of the disease depends on how old the patient is when diagnosed. But the disease can last for as many as 20 years. WHY IS EARLY DIAGNOSIS IMPORTANT? An early, accurate diagnosis of AD helps  patients and their families plan for the future. It gives them time to discuss care options while the patient can still take part in making decisions. Early diagnosis also offers the best chance to treat the symptoms of the disease. Document Released: 10/26/2003 Document Revised: 05/08/2011 Document Reviewed: 05/15/2008 [facility] Patient Information 2013 [place], [place].

## 2012-03-20 NOTE — Addendum Note (Signed)
Addended by: Rita Ohara R on: 03/20/2012 05:07 PM   Modules accepted: Orders

## 2012-03-21 ENCOUNTER — Ambulatory Visit: Payer: Medicare Other

## 2012-03-21 DIAGNOSIS — E039 Hypothyroidism, unspecified: Secondary | ICD-10-CM

## 2012-03-21 LAB — T3, FREE: T3, Free: 2.3 pg/mL (ref 2.3–4.2)

## 2012-03-22 ENCOUNTER — Ambulatory Visit: Payer: Medicare Other

## 2012-03-22 LAB — URINE CULTURE: Colony Count: 30000

## 2012-03-27 ENCOUNTER — Other Ambulatory Visit: Payer: Self-pay

## 2012-03-27 MED ORDER — CYANOCOBALAMIN 1000 MCG/ML IJ SOLN: 1000.0000 ug | INTRAMUSCULAR | Status: DC

## 2012-04-18 ENCOUNTER — Other Ambulatory Visit: Payer: Self-pay | Admitting: Obstetrics and Gynecology

## 2012-04-18 ENCOUNTER — Ambulatory Visit (INDEPENDENT_AMBULATORY_CARE_PROVIDER_SITE_OTHER): Payer: Medicare Other | Admitting: Family

## 2012-04-18 ENCOUNTER — Encounter: Payer: Self-pay | Admitting: Family

## 2012-04-18 VITALS — BP 98/60 | HR 104 | Wt 134.0 lb

## 2012-04-18 DIAGNOSIS — R413 Other amnesia: Secondary | ICD-10-CM

## 2012-04-18 DIAGNOSIS — Z1231 Encounter for screening mammogram for malignant neoplasm of breast: Secondary | ICD-10-CM

## 2012-04-18 NOTE — Progress Notes (Signed)
Subjective:    Patient ID: Becky [name], female    DOB: 1929/07/12, 79 y.o.   MRN: [id number]  HPI 79 year old white female, nonsmoker is in today with complaints of a mass she found in her left breast 2 days ago. Denies any pain. Denies any drainage or discharge from her breasts. Reports her last mammogram being within 5 years but is unsure of when it was specifically. Denies any increase in caffeine intake. No history of fibrocystic breast disease.  Family continues to be concerned about memory loss. They're requesting a referral to neurology. They report patient often forgets to take her medication and feels like she was better when she took the medication routinely. She was also found to have pernicious anemia and is taking B12 injections monthly.   Review of Systems  Constitutional: Negative.   Respiratory: Negative.   Cardiovascular: Negative.   Gastrointestinal: Negative.   Musculoskeletal: Negative.   Skin: Negative.   Neurological: Negative.  Negative for speech difficulty, light-headedness, numbness and headaches.  Hematological: Negative.   Psychiatric/Behavioral: Negative.    Past Medical History  Diagnosis Date  . Polio     PPS  . Arthritis   . Fibromyalgia   . Depression   . Hyperlipidemia   . Ulcer     small- stomach  . Migraines     History   Social History  . Marital Status: Divorced    Spouse Name: N/A    Number of Children: N/A  . Years of Education: N/A   Occupational History  . Not on file.   Social History Main Topics  . Smoking status: Never Smoker   . Smokeless tobacco: Never Used  . Alcohol Use: No  . Drug Use: No  . Sexually Active: No   Other Topics Concern  . Not on file   Social History Narrative  . No narrative on file    Past Surgical History  Procedure Laterality Date  . Inguinal herniorrhaphy    . Tubal ligation      Family History  Problem Relation Age of Onset  . Alcohol abuse      addiction   . Arthritis    .  Colon cancer      1st degree relative <60  . Diabetes      1st degree relative  . Psychosis    . Arthritis Brother   . Cancer Brother     No Known Allergies  Current Outpatient Prescriptions on File Prior to Visit  Medication Sig Dispense Refill  . acetaminophen (TYLENOL) 500 MG tablet Take 500 mg by mouth every 6 (six) hours as needed.        . Ascorbic Acid (VITAMIN C) 500 MG tablet Take 500 mg by mouth daily.        Marland Kitchen aspirin 81 MG EC tablet Take 81 mg by mouth daily.        Marland Kitchen aspirin 81 MG tablet Take 81 mg by mouth daily.        . Calcium Carb-Vit D-Soy Isoflav 600-200-25 MG-UNIT-MG TABS Take by mouth.        . carboxymethylcellulose (REFRESH PLUS) 0.5 % SOLN 1 drop.        . cyanocobalamin (,VITAMIN B-12,) 1000 MCG/ML injection Inject 1 mL (1,000 mcg total) into the muscle every 30 (thirty) days.  10 mL  1  . diclofenac (VOLTAREN) 75 MG EC tablet Take 1 tablet (75 mg total) by mouth 2 (two) times daily.  30 tablet  11  .  donepezil (ARICEPT) 10 MG tablet Take 10 mg by mouth at bedtime as needed.      . ergocalciferol (VITAMIN D2) 50000 UNITS capsule Take 50,000 Units by mouth once a week. For 12 weeks        . indapamide (LOZOL) 2.5 MG tablet TAKE 1 TABLET BY MOUTH EVERY MORNING AS NEEDED FOR EDEMA  30 tablet  0  . mirtazapine (REMERON) 30 MG tablet Take 1 tablet (30 mg total) by mouth at bedtime.  30 tablet  0  . NON FORMULARY Restall; otc used for sleep, qhs       . rivastigmine (EXELON) 4.6 mg/24hr Place 1 patch onto the skin daily.        . rosuvastatin (CRESTOR) 10 MG tablet Take 1 tablet (10 mg total) by mouth daily.  90 tablet  0  . SUMAtriptan (IMITREX) 50 MG tablet Take 1 tablet (50 mg total) by mouth daily. At onset of a headache  9 tablet  11  . travoprost, benzalkonium, (TRAVATAN) 0.004 % ophthalmic solution Place 1 drop into the right eye at bedtime.        . ALPRAZolam (XANAX) 0.25 MG tablet Take 0.25 mg by mouth at bedtime as needed.      . ibandronate (BONIVA) 150  MG tablet Take 1 tablet (150 mg total) by mouth every 30 (thirty) days. Take in the morning with a full glass of water, on an empty stomach, and do not take anything else by mouth or lie down for the next 30 min.  1 tablet  11  . pregabalin (LYRICA) 75 MG capsule Take 75 mg by mouth 2 (two) times daily.        . sertraline (ZOLOFT) 100 MG tablet Take 100 mg by mouth daily.       No current facility-administered medications on file prior to visit.    BP 98/60  Pulse 104  Wt 134 lb (60.782 kg)  BMI 24.91 kg/m2  SpO2 94%chart    Objective:   Physical Exam  Constitutional: She is oriented to person, place, and time. She appears well-developed and well-nourished.  Neck: Normal range of motion. Neck supple.  Cardiovascular: Normal rate and normal heart sounds.   Pulmonary/Chest: Effort normal and breath sounds normal. Right breast exhibits no inverted nipple, no mass, no nipple discharge, no skin change and no tenderness. Left breast exhibits mass. Left breast exhibits no inverted nipple, no nipple discharge, no skin change and no tenderness.    Musculoskeletal: Normal range of motion.  Neurological: She is alert and oriented to person, place, and time.  Skin: Skin is warm and dry.  Psychiatric: She has a normal mood and affect.          Assessment & Plan:  Assessment:  1. Left breast mass 2. Memory dysfunction 3. Pernicious anemia  Plan: Left breast ultrasound order with mammogram. Refer to neurology. Continue B12. Patient call the office with any questions or concerns. Recheck as scheduled, and as needed.

## 2012-04-18 NOTE — Addendum Note (Signed)
Addended by: Beverely Low on: 04/18/2012 01:47 PM   Modules accepted: Orders

## 2012-05-22 ENCOUNTER — Encounter: Payer: Self-pay | Admitting: Family

## 2012-07-03 ENCOUNTER — Ambulatory Visit: Payer: Self-pay | Admitting: Neurology

## 2012-07-05 ENCOUNTER — Other Ambulatory Visit (INDEPENDENT_AMBULATORY_CARE_PROVIDER_SITE_OTHER): Payer: Medicare Other

## 2012-07-05 ENCOUNTER — Ambulatory Visit (INDEPENDENT_AMBULATORY_CARE_PROVIDER_SITE_OTHER): Payer: Medicare Other | Admitting: Neurology

## 2012-07-05 ENCOUNTER — Encounter: Payer: Self-pay | Admitting: Neurology

## 2012-07-05 VITALS — BP 119/79 | HR 90 | Temp 98.7°F | Ht 61.0 in | Wt 135.0 lb

## 2012-07-05 DIAGNOSIS — R413 Other amnesia: Secondary | ICD-10-CM

## 2012-07-05 DIAGNOSIS — G3184 Mild cognitive impairment, so stated: Secondary | ICD-10-CM

## 2012-07-05 LAB — AST: AST: 22 U/L (ref 0–37)

## 2012-07-05 LAB — ALT: ALT: 18 U/L (ref 0–35)

## 2012-07-05 LAB — CREATININE KINASE MB: CK-MB: 5.4 ng/mL — ABNORMAL HIGH (ref 0.3–4.0)

## 2012-07-05 MED ORDER — MEMANTINE HCL ER 7 & 14 & 21 &28 MG PO CP24: 7.0000 mg | ORAL_CAPSULE | Freq: Every morning | ORAL | Status: DC

## 2012-07-05 MED ORDER — MEMANTINE HCL ER 28 MG PO CP24: 28.0000 mg | ORAL_CAPSULE | Freq: Once | ORAL | Status: DC

## 2012-07-05 NOTE — Progress Notes (Signed)
[facility]  Provider:  Dr Ramy Greth Referring Provider: Baker Pierini, FNP Primary Care Physician:  Janell Quiet, FNP  Chief Complaint  Patient presents with  . New Evaluation    memory dysfunction, rm 10    HPI:  Becky [name] is a 79 y.o. female here as a referral from Dr. Orvan Falconer for memory loss. 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 She was followed by her brother in -law, Dr . Scotty Court , in the past.    The patient follows ad lib. our primary care at [place] anteceding [facility] Roller of family nurse practitioner where. She is in the white 79 year old right-handed Caucasian female a nonsmoker and is concerned about memory loss as is her family. The patient denies any problems with medication intake either forgetting or taking double doses of medications the patient also states that she in general remembers names and is not searching for words. The patient reports that her sisters initiated her visit or her to seek neurologic evaluation. Her daughter is here today and states that she had noticed a Von Christmas time but her mother forgot certain things and that there was indeed him some irregularity with medication intake. She switched her medications into a pill box and this seems to have helped the patient greatly.   She also reports that her are still in sometimes misplaces items, such as her car keys but does in general may not be a new phenomenon. The patient contracted pol[age] at age 23-1/2 in 76 and has had an unstable gait for many years she has been using a cane for over 2 years now on a regular basis. He also was doing water aerobics and wants to go back to that. 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 [facility] in December 2012 her MRI was negative for stroke.  Today's am and 50 test short 29 of 30 points and the only point the patient lost was a question about  the floor of the building or address, followed by an [facility] test which showed 24 of 30 losing 5 points with immediate recall words and delayed recall words. Mild cognitive impairment.  Her PCP started Aricept in generic form about 2 years ago. Today we may add Namenda.     Review of Systems: Out of a complete 14 system review, the patient complains of only the following symptoms, and all other reviewed systems are negative. Some forgetfullness, frequent falls, she has also hit her head multiple times. Post POLIO . History   Social History  . Marital Status: Divorced    Spouse Name: N/A    Number of Children: N/A  . Years of Education: N/A   Occupational History  . Not on file.   Social History Main Topics  . Smoking status: Never Smoker   . Smokeless tobacco: Never Used  . Alcohol Use: No  . Drug Use: No  . Sexually Active: No   Other Topics Concern  . Not on file   Social History Narrative  . No narrative on file    Family History  Problem Relation Age of Onset  . Alcohol abuse      addiction   . Arthritis    . Colon cancer      1st degree relative <60  . Diabetes      1st degree relative  . Psychosis    . Arthritis Brother   . Cancer Brother  Past Medical History  Diagnosis Date  . Polio     PPS  . Arthritis   . Fibromyalgia   . Depression   . Hyperlipidemia   . Ulcer     small- stomach  . Migraines     Past Surgical History  Procedure Laterality Date  . Inguinal herniorrhaphy    . Tubal ligation       Allergies as of 07/05/2012  . (No Known Allergies)    Vitals: There were no vitals taken for this visit. Last Weight:  Wt Readings from Last 1 Encounters:  04/18/12 134 lb (60.782 kg)   Last Height:   Ht Readings from Last 1 Encounters:  05/19/10 5' 1.5" (1.562 m)   Vision Screening:   Physical exam:  General: The patient is awake, alert and appears not in acute distress. The patient is well groomed. Head: Normocephalic,  atraumatic. Neck is supple. Mallampati 2 , neck circumference:15 inches.  Cardiovascular:  Regular rate and rhythm , without  murmurs or carotid bruit, and without distended neck veins. Respiratory: Lungs are clear to auscultation. Skin:  Without evidence of edema, or rash Trunk: BMI is  elevated .Neurologic exam : The patient is awake and alert, oriented to place and time.  Memory subjective  "  forget some things_" nothing that's important.  MMSE 29-30 and MOCA 24-30 points. AFT 18 , FWF 11 points.   There is a normal attention span & concentration ability. Speech is fluent without  dysarthria, dysphonia or aphasia. Mood and affect are appropriate.  Cranial nerves: Pupils are equal and briskly reactive to light. Funduscopic exam without evidence of pallor or edema. Extraocular movements  in vertical and horizontal planes intact and without nystagmus. Visual fields by finger perimetry are intact. Hearing to finger rub intact.  Facial sensation intact to fine touch. Facial motor strength is symmetric and tongue and uvula move midline.  Motor exam:   Normal tone and normal muscle bulk and symmetric normal strength in all extremities.  Sensory:  Fine touch, pinprick and vibration were tested in all extremities. Proprioception is tested in the upper extremities only. This was  normal.  Coordination: Rapid alternating movements in the fingers/hands is tested and normal. Finger-to-nose maneuver tested and normal without evidence of ataxia, dysmetria or tremor.  Gait and station: Patient walks with a cane as an  assistive device and is able to climb up to the exam table.  Strength within normal limits. Stance is stable and normal.  Right  Leg weakness to flexion in the hip  From polio, pain free at this point.  Deep tendon reflexes: in the  upper and lower extremities are symmetric and intact. Babinski maneuver response is  downgoing.   Assessment:  After physical and neurologic examination, review  of laboratory studies, imaging, neurophysiology testing and pre-existing records, assessment will be reviewed on the problem list.  Plan:  Treatment plan and additional workup will be reviewed under Problem List.   Based on today's memory testing I prefer the diagnosis of mild cognitive impairment with delayed recall deficits. This would not be considered dementia. Since the patient is already taking Aricept in its generic form for over 2 years, I will add Namenda to the regimen. She has multiple vitamins and vitamin B12 supplementation, and is followed regularly by primary care. The patient had TSH testing which showed a low value.  I will perform the dementia  panel.  I have no focal deficits in the patient neurologic status.  There  was no  Suggestion of vascular CNS disease.

## 2012-07-05 NOTE — Patient Instructions (Signed)
Patient needs a hearing aid.

## 2012-07-08 ENCOUNTER — Encounter: Payer: Self-pay | Admitting: Neurology

## 2012-07-10 ENCOUNTER — Encounter: Payer: Self-pay | Admitting: Neurology

## 2012-07-25 ENCOUNTER — Ambulatory Visit: Payer: Self-pay | Admitting: Neurology

## 2012-09-18 ENCOUNTER — Other Ambulatory Visit: Payer: Self-pay

## 2012-09-18 MED ORDER — DONEPEZIL HCL 10 MG PO TABS: 10.0000 mg | ORAL_TABLET | Freq: Every evening | ORAL | Status: DC | PRN

## 2012-09-19 ENCOUNTER — Telehealth: Payer: Self-pay

## 2012-09-19 NOTE — Telephone Encounter (Signed)
Returned daughter Lila's call, left vmail per fax request.  Answered question, per Dr. Vickey Huger, yes patient should be taking donepezil and namenda once daily in the a.m. Should verify with psychiatrist (or whoever prescribed)  if sertraline should be taken or not.

## 2012-09-21 IMAGING — CT CT HEAD W/O CM
1 series · 16 of 30 positions shown, 20 images · non-contrast
Comparison: Dictated report from CT of the brain from 08/19/1998

CLINICAL DATA: Bilateral leg weakness

CT HEAD WITHOUT CONTRAST
TECHNIQUE: Contiguous axial images were obtained from the base of
the skull through the vertex without contrast.

[Series 2: head_seq 4.5 h37s st · axial · 0.43mm/px · z∈[+1110,+1236]mm · 16 of 32 slices shown, 20 images]
[im 2/32  brain]
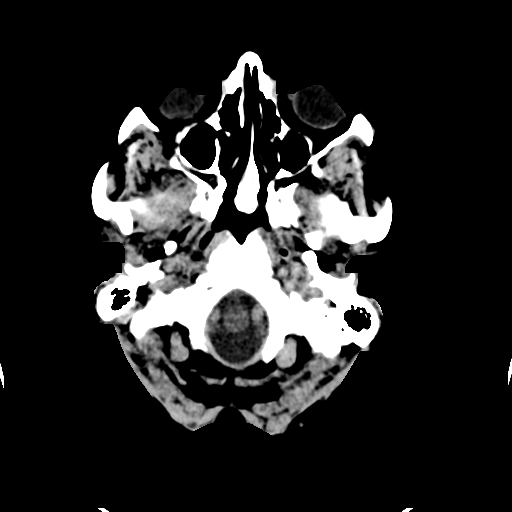
[im 2/32  bone]
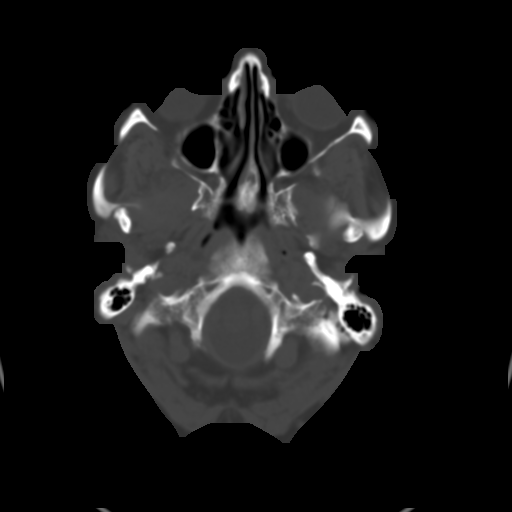
[im 4/32  brain]
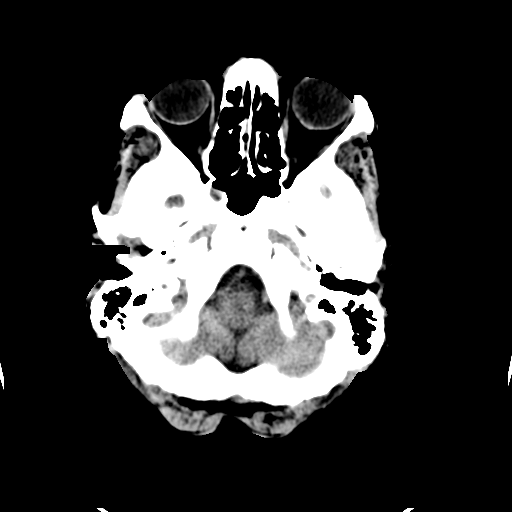
[im 6/32  brain]
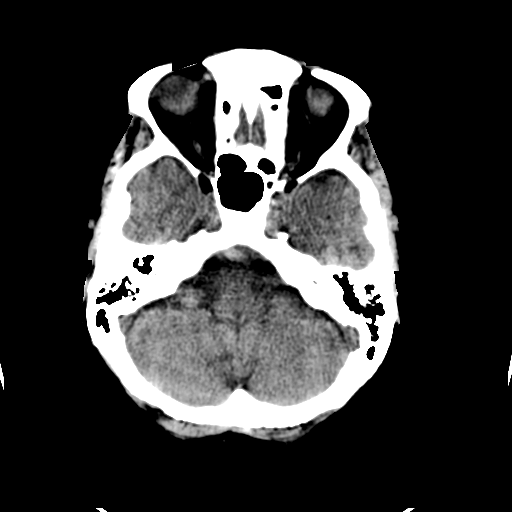
[im 8/32  brain]
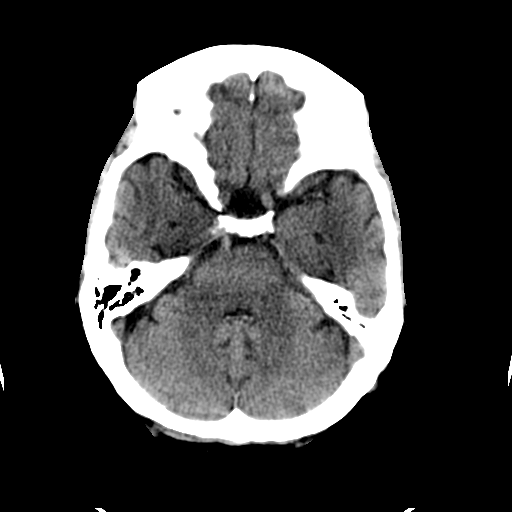
[im 9/32  brain]
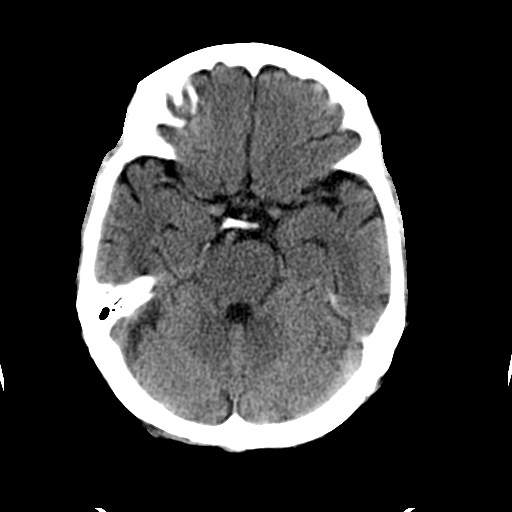
[im 9/32  bone]
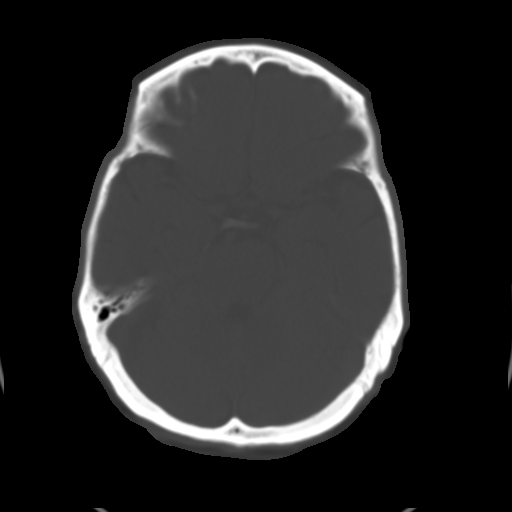
[im 11/32  brain]
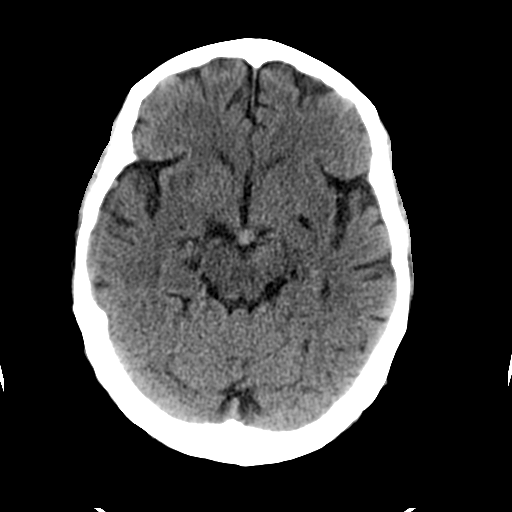
[im 13/32  brain]
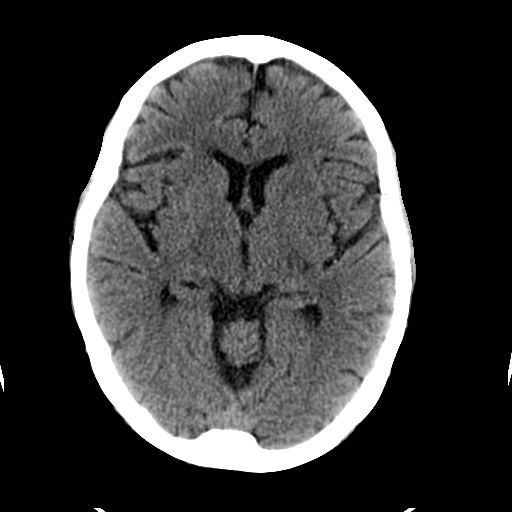
[im 15/32  brain]
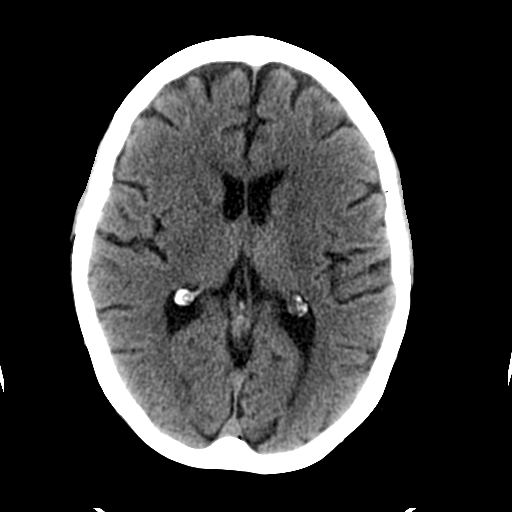
[im 17/32  brain]
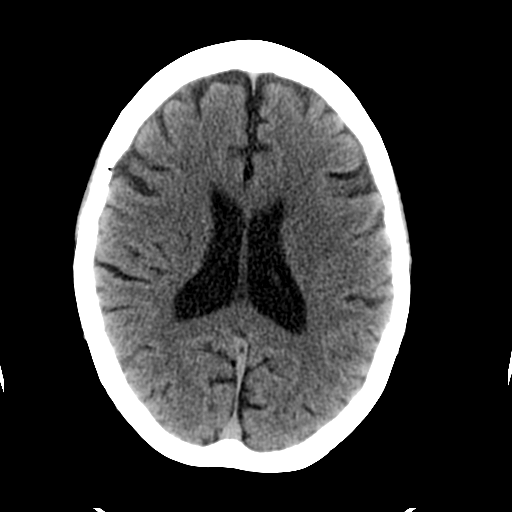
[im 17/32  bone]
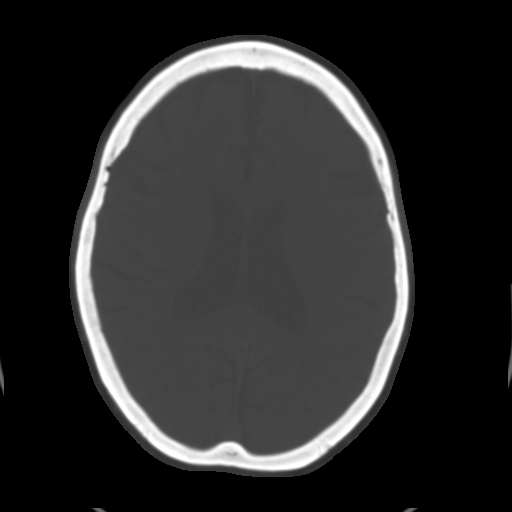
[im 19/32  brain]
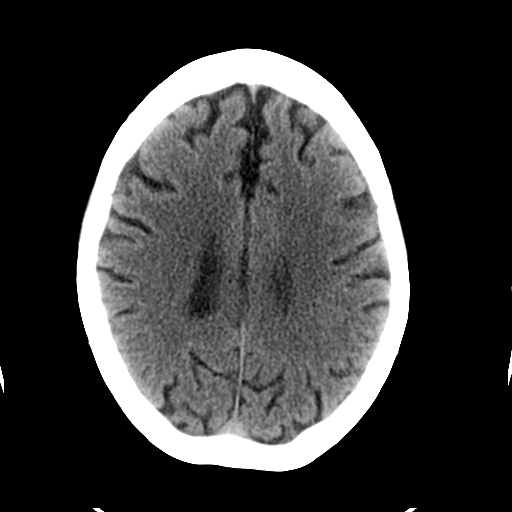
[im 21/32  brain]
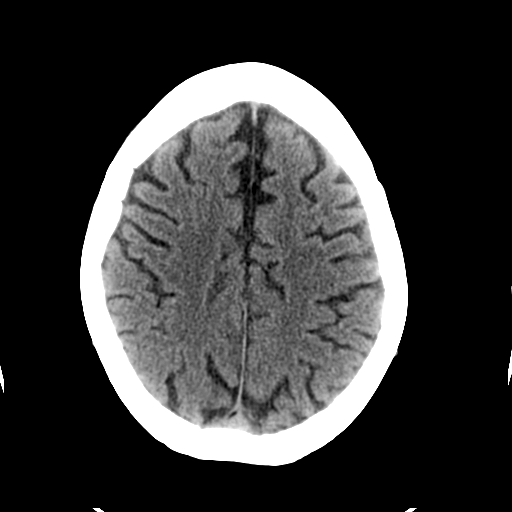
[im 23/32  brain]
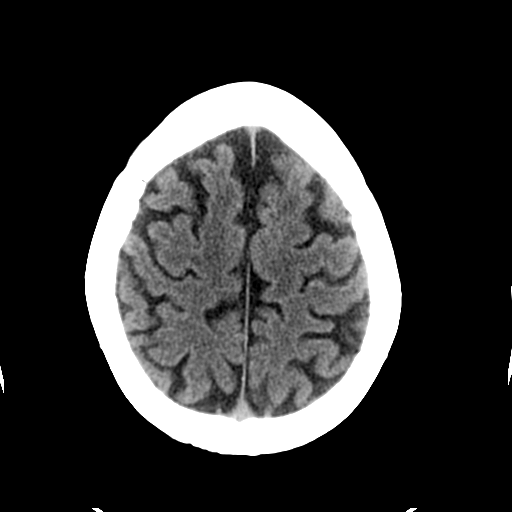
[im 24/32  brain]
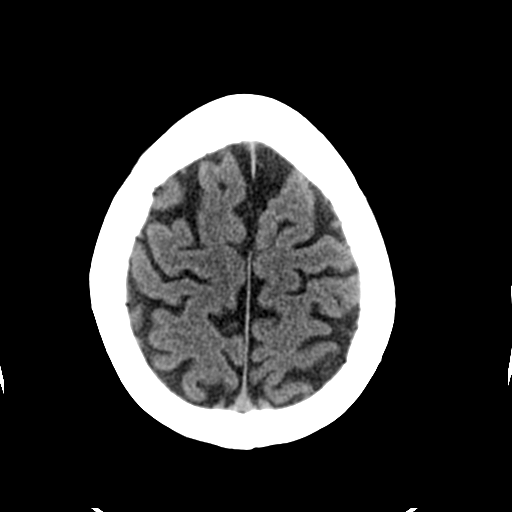
[im 24/32  bone]
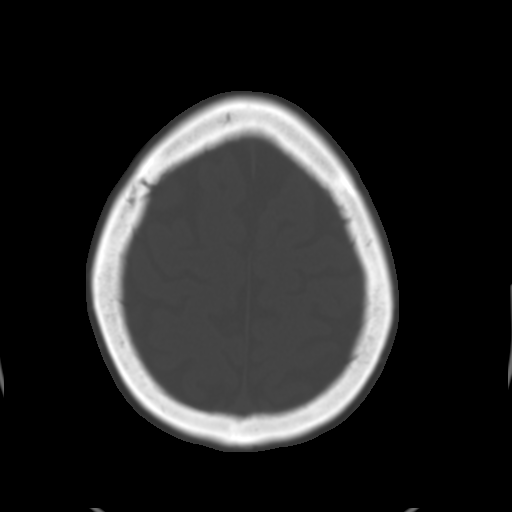
[im 26/32  brain]
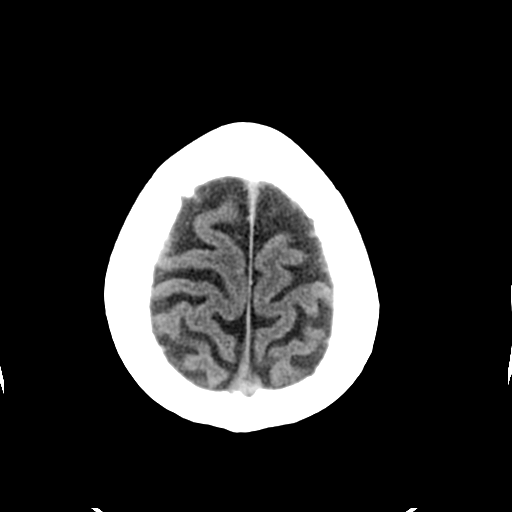
[im 28/32  brain]
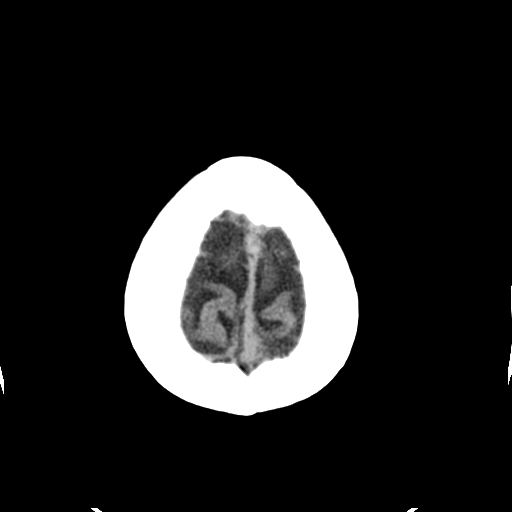
[im 30/32  brain]
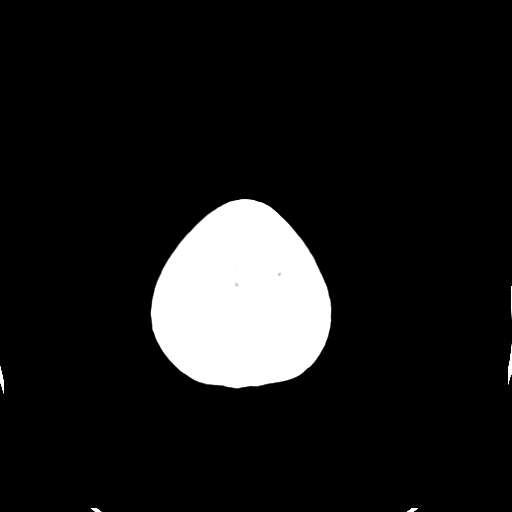

[16 of 30 positions shown; findings below may reference images not displayed]

FINDINGS: The ventricular system is slightly prominent as are the
cortical sulci indicative of diffuse atrophy.  The septum is in a
normal midline position.  No hemorrhage, mass lesion, or acute
infarction is seen.  Minimal small vessel ischemic change is noted
in the periventricular white matter.  On bone window images no
calvarial abnormality is seen.  The paranasal sinuses are clear
other than a minimal amount of fluid in the right partition of the
sphenoid sinus.
IMPRESSION: 1.  Atrophy.  No acute abnormality.
2.  Minimal small vessel ischemic change.

## 2012-09-21 IMAGING — CR DG CHEST 2V
2 series · 2 of 2 positions shown · non-contrast
Comparison: None

CLINICAL DATA: Weakness

CHEST - 2 VIEW

[w chest lat]
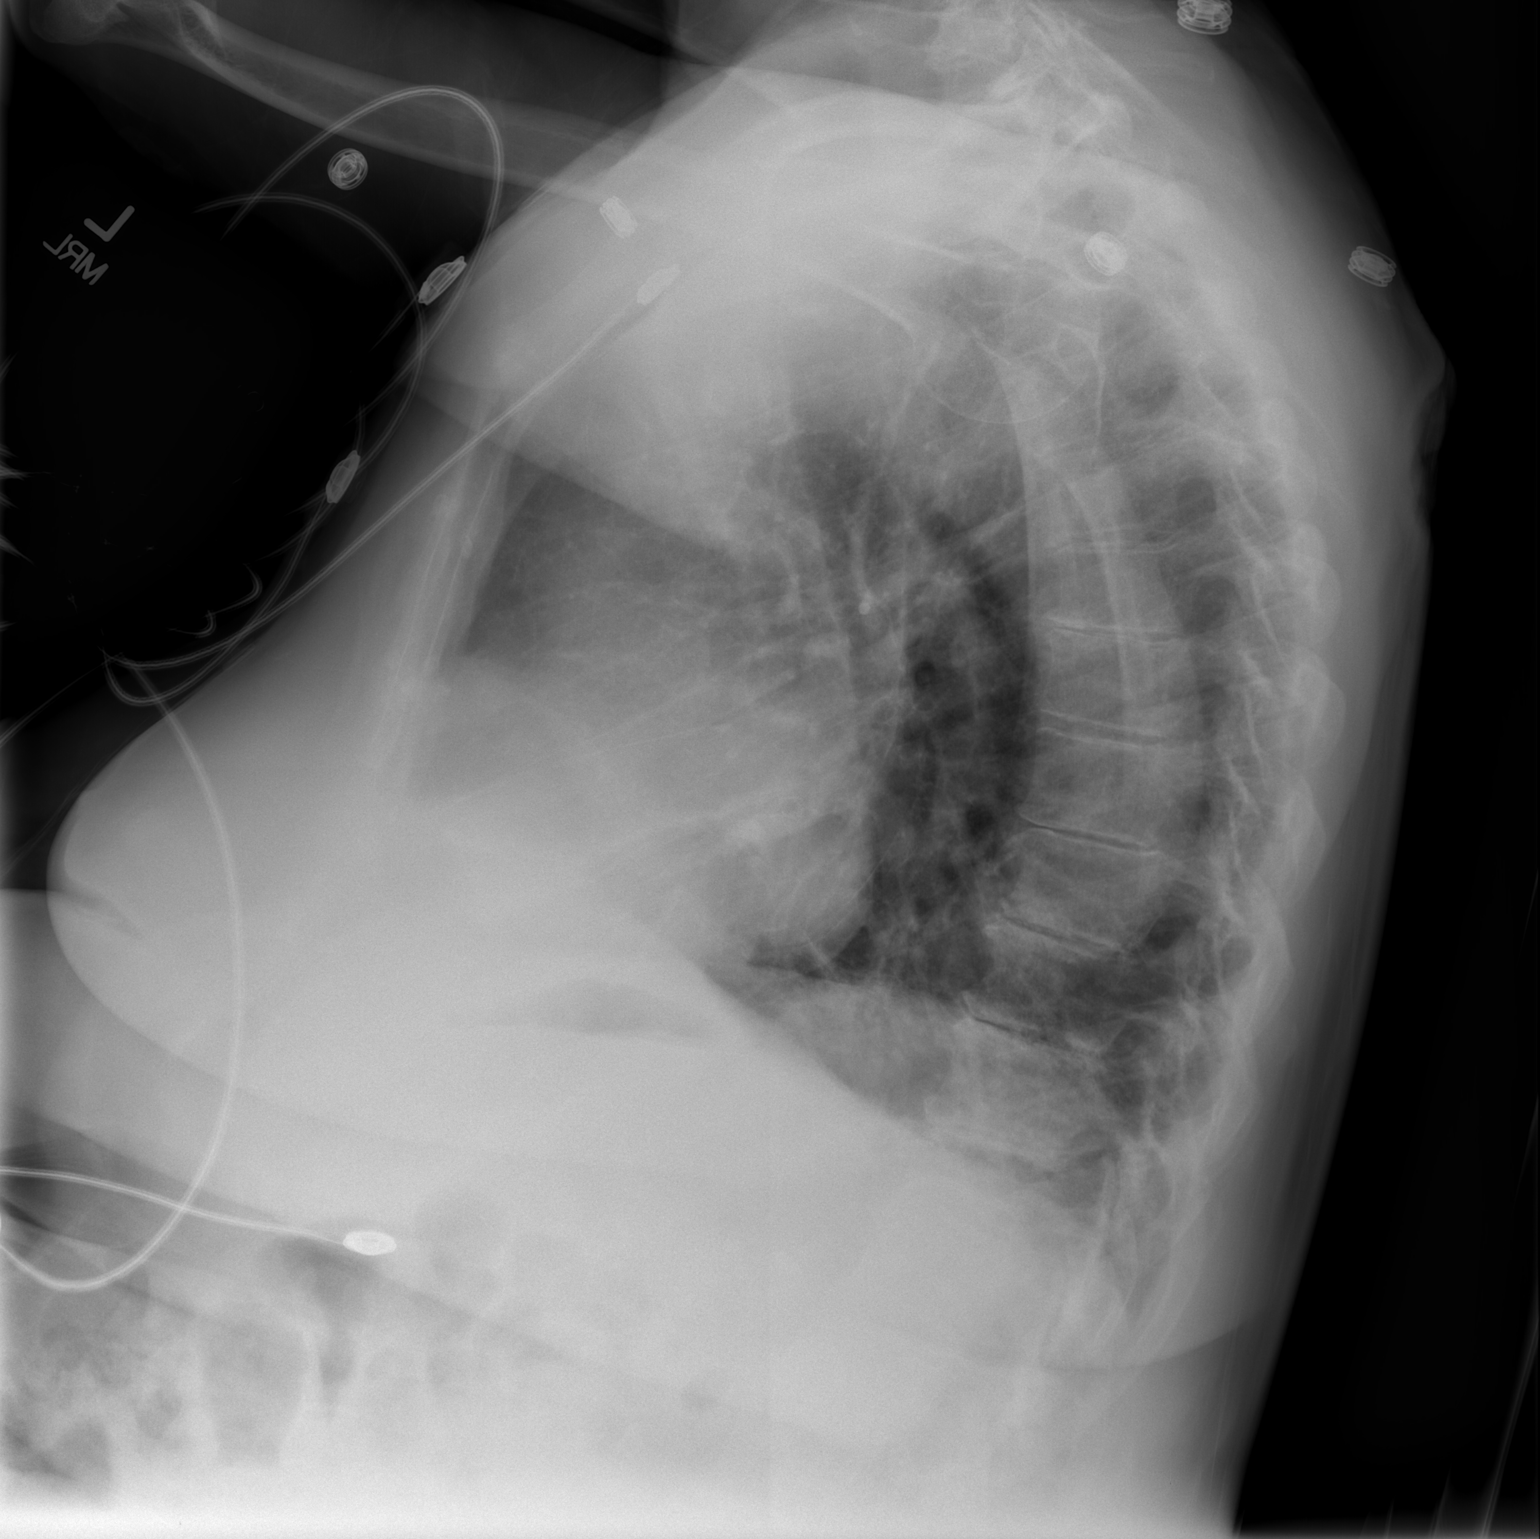

[view not recorded]
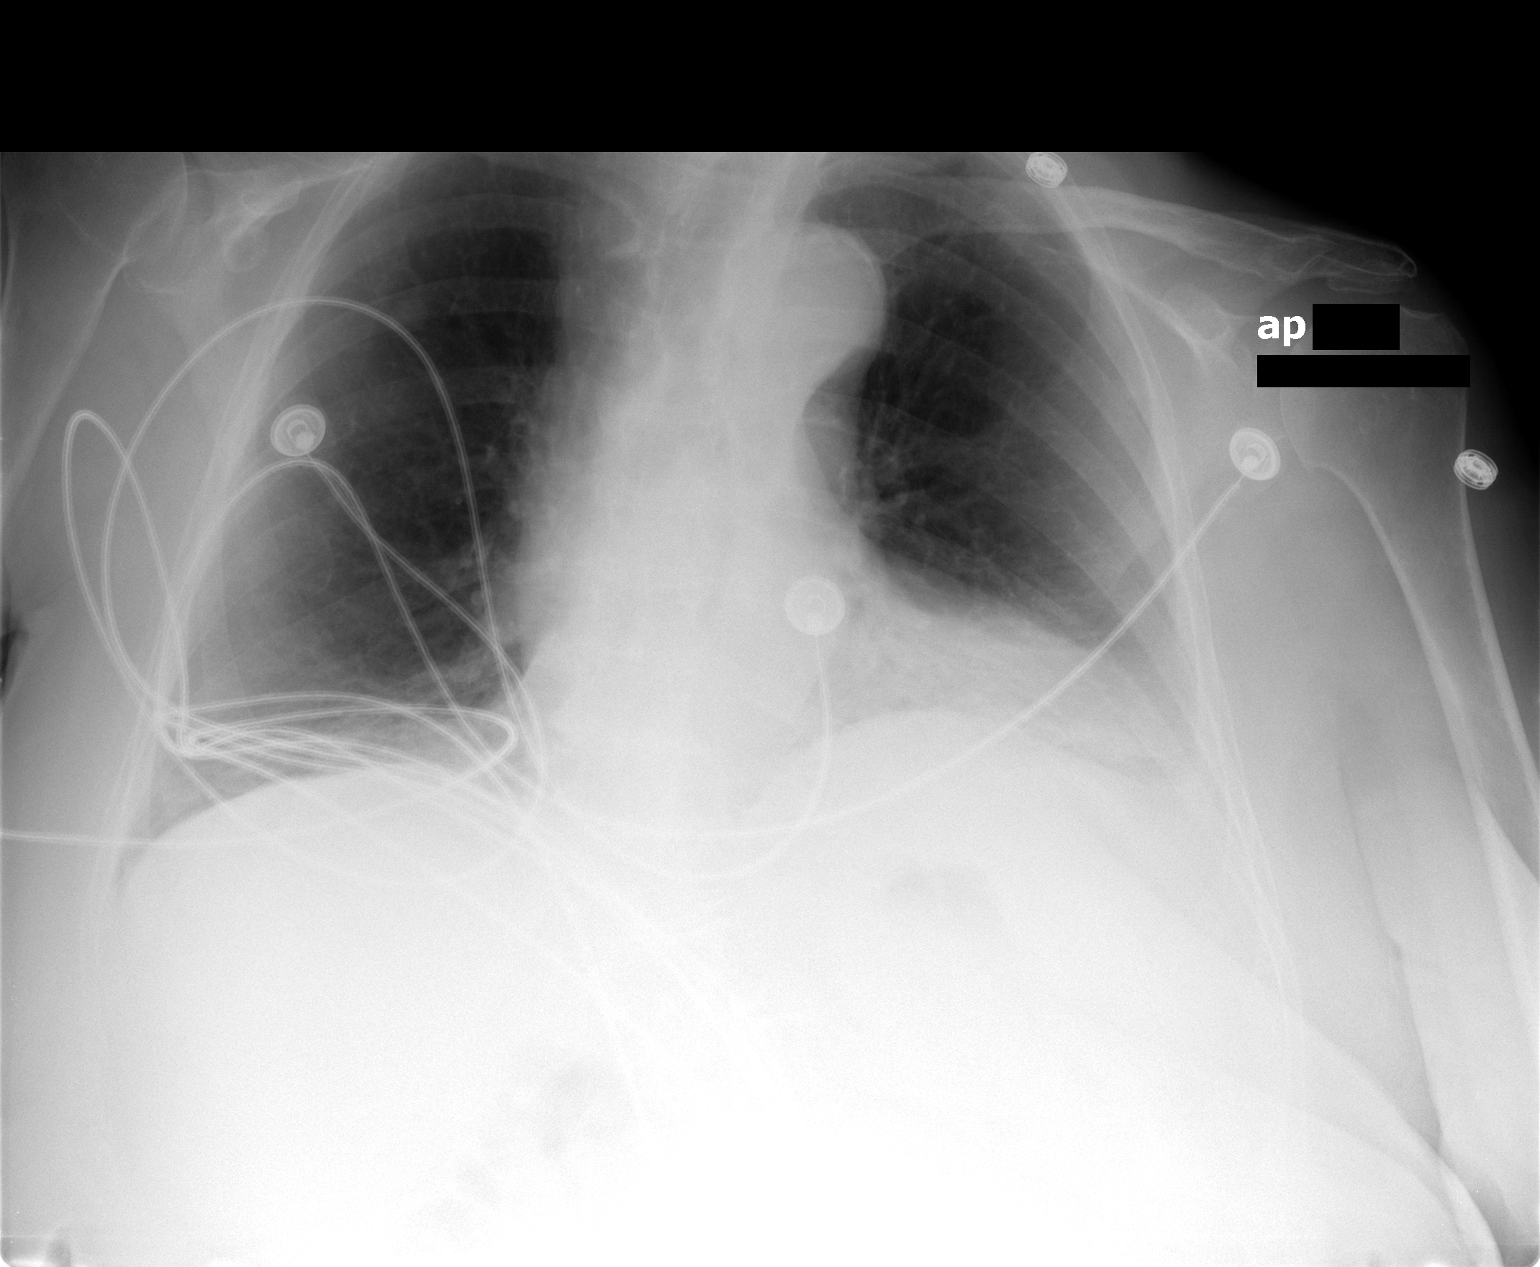

[2 of 2 positions shown; findings below may reference images not displayed]

FINDINGS: Heart size upper normal.  Negative for heart failure.
Mild bibasilar atelectasis.  No definite pneumonia or effusion.
IMPRESSION: Mild bibasilar atelectasis.

## 2012-10-09 ENCOUNTER — Encounter: Payer: Self-pay | Admitting: Neurology

## 2012-10-09 ENCOUNTER — Ambulatory Visit (INDEPENDENT_AMBULATORY_CARE_PROVIDER_SITE_OTHER): Payer: Medicare Other | Admitting: Neurology

## 2012-10-09 VITALS — BP 116/69 | HR 99 | Wt 128.0 lb

## 2012-10-09 DIAGNOSIS — R413 Other amnesia: Secondary | ICD-10-CM

## 2012-10-09 DIAGNOSIS — G3184 Mild cognitive impairment, so stated: Secondary | ICD-10-CM

## 2012-10-09 NOTE — Progress Notes (Signed)
[facility]  Provider:  Melvyn Novas, M D  Referring Provider: Baker Pierini, FNP Primary Care Physician:  Becky Quiet, FNP  Chief complaint is memory loss.  HPI:  Becky [name] is a 79 y.o. female  Is seen here as a referral/ revisit  from Dr. Orvan [name] for  Follow up . Becky [name] had last been seen on 07/05/2012 she has a history of polio and a neurologic gait disorder resulting from it. The nurse practitioner that referred her, Becky [name], wanted further assessment for memory loss. The patient is a 79 year old, right-handed, Caucasian female nonsmoker and reports that she has problems with remembering to take medication, were sometimes has even taken a double doors forgotten that she already took her pills earlier. The patient had originally reported that her sister initiated her visit to see a neurologist the family has noted that since Christmastime 2013 Becky [name] had become progressively more forgetful and that there was some irregularity indeed his medication intake. The patient also will sometimes misplaces items but this may not be a new phenomenon. For her past medical history is important that she contracted poorly you at age 81 1/4  in 78. She has been doing water aerobics and she has been followed in [facility] for the past polio syndrome . In her May visit the patient underwent MOCA, documenting 24 of 30 points losing 5 points in immediate recall of words.   A diagnosis was mild cognitive impairment the patient already had been started on Aricept in its generic form 2 years prior and the added Namenda after her May visit. The patient feels that her cognitive abilities are unchanged. She has not noticed a decline neither her family members.   Review of Systems: Out of a complete 14 system review, the patient complains of only the following symptoms, and all other reviewed systems are negative. Gait, depression, insomnia.    Patient had 3 3 children, one daughter died of a heart condition the 2 others are alive and healthy. The patient was non compliant with her antidepressants, and other medication. She has now a pillbox and gets daily calls for her daughter .  She had a parent with Alzheimer's dementia.   History   Social History  . Marital Status: Divorced    Spouse Name: N/A    Number of Children: N/A  . Years of Education: N/A   Occupational History  . Not on file.   Social History Main Topics  . Smoking status: Never Smoker   . Smokeless tobacco: Never Used  . Alcohol Use: No  . Drug Use: No  . Sexual Activity: No   Other Topics Concern  . Not on file   Social History Narrative  . No narrative on file    Family History  Problem Relation Age of Onset  . Alcohol abuse      addiction   . Arthritis    . Colon cancer      1st degree relative <60  . Diabetes      1st degree relative  . Psychosis    . Arthritis Brother   . Cancer Brother     Past Medical History  Diagnosis Date  . Polio     PPS  . Arthritis   . Fibromyalgia   . Depression   . Hyperlipidemia   . Ulcer     small- stomach  . Migraines     Past Surgical History  Procedure Laterality Date  .  Inguinal herniorrhaphy    . Tubal ligation      Current Outpatient Prescriptions  Medication Sig Dispense Refill  . acetaminophen (TYLENOL) 500 MG tablet Take 500 mg by mouth every 6 (six) hours as needed.        . cyanocobalamin (,VITAMIN B-12,) 1000 MCG/ML injection Inject 1 mL (1,000 mcg total) into the muscle every 30 (thirty) days.  10 mL  1  . donepezil (ARICEPT) 10 MG tablet Take 1 tablet (10 mg total) by mouth at bedtime as needed.  90 tablet  0  . Memantine HCl ER (NAMENDA XR TITRATION PACK) 7 & 14 & 21 &28 MG CP24 Take 7 mg by mouth every morning.  28 capsule  5  . sertraline (ZOLOFT) 100 MG tablet Take 100 mg by mouth daily. 2 tablets daily      . Sodium Chloride, Hypertonic, (MURO 128 OP) Apply to eye.  One drop in left eye  2-4 times daily      . travoprost, benzalkonium, (TRAVATAN) 0.004 % ophthalmic solution Place 1 drop into the right eye at bedtime.        Marland Kitchen VITAMIN D, CHOLECALCIFEROL, PO Take by mouth. Vitamin d3,as needed       No current facility-administered medications for this visit.    Allergies as of 10/09/2012  . (No Known Allergies)    Vitals: BP 116/69  Pulse 99  Wt 128 lb (58.06 kg)  BMI 24.2 kg/m2 Last Weight:  Wt Readings from Last 1 Encounters:  10/09/12 128 lb (58.06 kg)   Last Height:   Ht Readings from Last 1 Encounters:  07/05/12 5\' 1"  (1.549 m)    Physical exam:General: The patient is awake, alert and appears not in acute distress. The patient is well groomed.  Head: Normocephalic, atraumatic. Neck is supple. Mallampati 2 , neck circumference:15 inches.  Cardiovascular: Regular rate and rhythm , without murmurs or carotid bruit, and without distended neck veins.  Respiratory: Lungs are clear to auscultation.  Skin: Without evidence of edema, or rash  Trunk: BMI is elevated .Neurologic exam :  The patient is awake and alert, oriented to place and time. Memory subjective " forget some things_" nothing that's important. MMSE 29-30 and MOCA 24-30 points. AFT 18 , FWF 11 points.  There is a normal attention span & concentration ability. Speech is fluent without dysarthria, dysphonia or aphasia. Mood and affect are appropriate.  Cranial nerves:  Pupils are equal and briskly reactive to light. Funduscopic exam - the patient has Glaucoma and  is status post cataract lens replacement. She has nystagmus with gaze to the let at endpoint, not see on the right.     Visual fields by finger perimetry are intact.  Hearing to finger rub intact. Facial sensation intact to fine touch. Facial motor - left eye ptosis.   Motor exam: the patient is impaired by post polio.  Sensory: Fine touch, pinprick and vibration were tested in all extremities. Proprioception is tested in  the upper extremities only. This was normal.  Coordination: Rapid alternating movements in the fingers/hands is tested and normal. Finger-to-nose maneuver revealed mild intention / action  tremor.  Gait and station: Patient walks with a cane as an assistive device and is able to climb up to the exam table.  Strength within normal limits. Stance is stable and normal. Right Leg weakness to flexion in the hip From polio, pain free at this point.  Deep tendon reflexes: in the upper and lower extremities are symmetric and  intact. Babinski maneuver response is downgoing.   Assessment -  mild cognitive impairment, with 2 day improved scores on them all with the a and work fluency testing /continue Aricept and [employer].

## 2013-01-20 ENCOUNTER — Other Ambulatory Visit: Payer: Self-pay

## 2013-01-20 MED ORDER — DONEPEZIL HCL 10 MG PO TABS: 10.0000 mg | ORAL_TABLET | Freq: Every evening | ORAL | Status: DC | PRN

## 2013-02-03 ENCOUNTER — Encounter: Payer: Self-pay | Admitting: Nurse Practitioner

## 2013-03-09 ENCOUNTER — Other Ambulatory Visit: Payer: Self-pay | Admitting: Neurology

## 2013-04-17 ENCOUNTER — Encounter: Payer: Medicare Other | Admitting: Family

## 2013-05-01 ENCOUNTER — Ambulatory Visit: Payer: Medicare Other | Admitting: Nurse Practitioner

## 2013-05-09 ENCOUNTER — Ambulatory Visit: Payer: Medicare Other | Admitting: Nurse Practitioner

## 2013-05-14 ENCOUNTER — Ambulatory Visit (INDEPENDENT_AMBULATORY_CARE_PROVIDER_SITE_OTHER)
Admission: RE | Admit: 2013-05-14 | Discharge: 2013-05-14 | Disposition: A | Discharge status: Home / Self Care | Payer: Medicare Other | Source: Ambulatory Visit | Attending: Family | Admitting: Family

## 2013-05-14 ENCOUNTER — Other Ambulatory Visit (INDEPENDENT_AMBULATORY_CARE_PROVIDER_SITE_OTHER): Payer: Medicare Other

## 2013-05-14 ENCOUNTER — Encounter: Payer: Self-pay | Admitting: Family

## 2013-05-14 ENCOUNTER — Ambulatory Visit (INDEPENDENT_AMBULATORY_CARE_PROVIDER_SITE_OTHER): Payer: Medicare Other | Admitting: Family

## 2013-05-14 VITALS — BP 128/80 | HR 107 | Temp 98.5°F | Ht 60.0 in | Wt 135.0 lb

## 2013-05-14 DIAGNOSIS — Z1231 Encounter for screening mammogram for malignant neoplasm of breast: Secondary | ICD-10-CM

## 2013-05-14 DIAGNOSIS — N390 Urinary tract infection, site not specified: Secondary | ICD-10-CM

## 2013-05-14 DIAGNOSIS — F0390 Unspecified dementia without behavioral disturbance: Secondary | ICD-10-CM

## 2013-05-14 DIAGNOSIS — E785 Hyperlipidemia, unspecified: Secondary | ICD-10-CM

## 2013-05-14 DIAGNOSIS — E039 Hypothyroidism, unspecified: Secondary | ICD-10-CM

## 2013-05-14 DIAGNOSIS — E559 Vitamin D deficiency, unspecified: Secondary | ICD-10-CM

## 2013-05-14 DIAGNOSIS — Z23 Encounter for immunization: Secondary | ICD-10-CM

## 2013-05-14 DIAGNOSIS — Z1239 Encounter for other screening for malignant neoplasm of breast: Secondary | ICD-10-CM

## 2013-05-14 DIAGNOSIS — Z780 Asymptomatic menopausal state: Secondary | ICD-10-CM

## 2013-05-14 DIAGNOSIS — Z0000 Encounter for general adult medical examination without abnormal findings: Secondary | ICD-10-CM

## 2013-05-14 DIAGNOSIS — H9110 Presbycusis, unspecified ear: Secondary | ICD-10-CM

## 2013-05-14 DIAGNOSIS — R0602 Shortness of breath: Secondary | ICD-10-CM

## 2013-05-14 LAB — HEPATIC FUNCTION PANEL
ALBUMIN: 3.9 g/dL (ref 3.5–5.2)
ALK PHOS: 68 U/L (ref 39–117)
ALT: 21 U/L (ref 0–35)
AST: 19 U/L (ref 0–37)
BILIRUBIN DIRECT: 0.1 mg/dL (ref 0.0–0.3)
BILIRUBIN TOTAL: 0.9 mg/dL (ref 0.3–1.2)
Total Protein: 7 g/dL (ref 6.0–8.3)

## 2013-05-14 LAB — CBC WITH DIFFERENTIAL/PLATELET
BASOS ABS: 0 10*3/uL (ref 0.0–0.1)
Basophils Relative: 0.4 % (ref 0.0–3.0)
EOS ABS: 0.3 10*3/uL (ref 0.0–0.7)
Eosinophils Relative: 4.4 % (ref 0.0–5.0)
HEMATOCRIT: 40.8 % (ref 36.0–46.0)
Hemoglobin: 13.7 g/dL (ref 12.0–15.0)
LYMPHS ABS: 1.2 10*3/uL (ref 0.7–4.0)
LYMPHS PCT: 18.9 % (ref 12.0–46.0)
MCHC: 33.6 g/dL (ref 30.0–36.0)
MCV: 93.1 fl (ref 78.0–100.0)
MONOS PCT: 7.5 % (ref 3.0–12.0)
Monocytes Absolute: 0.5 10*3/uL (ref 0.1–1.0)
NEUTROS ABS: 4.3 10*3/uL (ref 1.4–7.7)
Neutrophils Relative %: 68.8 % (ref 43.0–77.0)
PLATELETS: 214 10*3/uL (ref 150.0–400.0)
RBC: 4.38 Mil/uL (ref 3.87–5.11)
RDW: 12.8 % (ref 11.5–14.6)
WBC: 6.2 10*3/uL (ref 4.5–10.5)

## 2013-05-14 LAB — BASIC METABOLIC PANEL
BUN: 16 mg/dL (ref 6–23)
CALCIUM: 9.3 mg/dL (ref 8.4–10.5)
CO2: 27 meq/L (ref 19–32)
Chloride: 107 mEq/L (ref 96–112)
Creatinine, Ser: 0.7 mg/dL (ref 0.4–1.2)
GFR: 89.53 mL/min (ref >60.00)
GLUCOSE: 82 mg/dL (ref 70–99)
Potassium: 3.4 mEq/L — ABNORMAL LOW (ref 3.5–5.1)
Sodium: 143 mEq/L (ref 135–145)

## 2013-05-14 LAB — POCT URINALYSIS DIPSTICK
BILIRUBIN UA: NEGATIVE
Glucose, UA: NEGATIVE
KETONES UA: NEGATIVE
Nitrite, UA: NEGATIVE
Urobilinogen, UA: 0.2
pH, UA: 5.5

## 2013-05-14 LAB — TSH: TSH: 1.5 u[IU]/mL (ref 0.35–5.50)

## 2013-05-14 LAB — LIPID PANEL
CHOLESTEROL: 280 mg/dL — AB (ref 0–200)
HDL: 57.5 mg/dL (ref >39.00)
LDL Cholesterol: 181 mg/dL — ABNORMAL HIGH (ref 0–99)
Total CHOL/HDL Ratio: 5
Triglycerides: 209 mg/dL — ABNORMAL HIGH (ref 0.0–149.0)
VLDL: 41.8 mg/dL — AB (ref 0.0–40.0)

## 2013-05-14 MED ORDER — DONEPEZIL HCL 10 MG PO TABS: 10.0000 mg | ORAL_TABLET | Freq: Every evening | ORAL | Status: DC | PRN

## 2013-05-14 MED ORDER — SULFAMETHOXAZOLE-TRIMETHOPRIM 800-160 MG PO TABS: 1.0000 | ORAL_TABLET | Freq: Two times a day (BID) | ORAL | Status: AC

## 2013-05-14 MED ORDER — CYANOCOBALAMIN 1000 MCG/ML IJ SOLN: 1000.0000 ug | INTRAMUSCULAR | Status: DC

## 2013-05-14 NOTE — Patient Instructions (Signed)
Breast Self-Awareness Practicing breast self-awareness may pick up problems early, prevent significant medical complications, and possibly save your life. By practicing breast self-awareness, you can become familiar with how your breasts look and feel and if your breasts are changing. This allows you to notice changes early. It can also offer you some reassurance that your breast health is good. One way to learn what is normal for your breasts and whether your breasts are changing is to do a breast self-exam. If you find a lump or something that was not present in the past, it is best to contact your caregiver right away. Other findings that should be evaluated by your caregiver include nipple discharge, especially if it is bloody; skin changes or reddening; areas where the skin seems to be pulled in (retracted); or new lumps and bumps. Breast pain is seldom associated with cancer (malignancy), but should also be evaluated by a caregiver. HOW TO PERFORM A BREAST SELF-EXAM The best time to examine your breasts is 5 7 days after your menstrual period is over. During menstruation, the breasts are lumpier, and it may be more difficult to pick up changes. If you do not menstruate, have reached menopause, or had your uterus removed (hysterectomy), you should examine your breasts at regular intervals, such as monthly. If you are breastfeeding, examine your breasts after a feeding or after using a breast pump. Breast implants do not decrease the risk for lumps or tumors, so continue to perform breast self-exams as recommended. Talk to your caregiver about how to determine the difference between the implant and breast tissue. Also, talk about the amount of pressure you should use during the exam. Over time, you will become more familiar with the variations of your breasts and more comfortable with the exam. A breast self-exam requires you to remove all your clothes above the waist. 1. Look at your breasts and nipples.  Stand in front of a mirror in a room with good lighting. With your hands on your hips, push your hands firmly downward. Look for a difference in shape, contour, and size from one breast to the other (asymmetry). Asymmetry includes puckers, dips, or bumps. Also, look for skin changes, such as reddened or scaly areas on the breasts. Look for nipple changes, such as discharge, dimpling, repositioning, or redness. 2. Carefully feel your breasts. This is best done either in the shower or tub while using soapy water or when flat on your back. Place the arm (on the side of the breast you are examining) above your head. Use the pads (not the fingertips) of your three middle fingers on your opposite hand to feel your breasts. Start in the underarm area and use  inch (2 cm) overlapping circles to feel your breast. Use 3 different levels of pressure (light, medium, and firm pressure) at each circle before moving to the next circle. The light pressure is needed to feel the tissue closest to the skin. The medium pressure will help to feel breast tissue a little deeper, while the firm pressure is needed to feel the tissue close to the ribs. Continue the overlapping circles, moving downward over the breast until you feel your ribs below your breast. Then, move one finger-width towards the center of the body. Continue to use the  inch (2 cm) overlapping circles to feel your breast as you move slowly up toward the collar bone (clavicle) near the base of the neck. Continue the up and down exam using all 3 pressures until you reach   the middle of the chest. Do this with each breast, carefully feeling for lumps or changes. 3.  Keep a written record with breast changes or normal findings for each breast. By writing this information down, you do not need to depend only on memory for size, tenderness, or location. Write down where you are in your menstrual cycle, if you are still menstruating. Breast tissue can have some lumps or  thick tissue. However, see your caregiver if you find anything that concerns you.  SEEK MEDICAL CARE IF:  You see a change in shape, contour, or size of your breasts or nipples.   You see skin changes, such as reddened or scaly areas on the breasts or nipples.   You have an unusual discharge from your nipples.   You feel a new lump or unusually thick areas.  Document Released: 02/13/2005 Document Revised: 01/31/2012 Document Reviewed: 05/31/2011 [facility] Patient Information 2014 [place]. Urinary Tract Infection Urinary tract infections (UTIs) can develop anywhere along your urinary tract. Your urinary tract is your body's drainage system for removing wastes and extra water. Your urinary tract includes two kidneys, two ureters, a bladder, and a urethra. Your kidneys are a pair of bean-shaped organs. Each kidney is about the size of your fist. They are located below your ribs, one on each side of your spine. CAUSES Infections are caused by microbes, which are microscopic organisms, including fungi, viruses, and bacteria. These organisms are so small that they can only be seen through a microscope. Bacteria are the microbes that most commonly cause UTIs. SYMPTOMS  Symptoms of UTIs may vary by age and gender of the patient and by the location of the infection. Symptoms in young women typically include a frequent and intense urge to urinate and a painful, burning feeling in the bladder or urethra during urination. Older women and men are more likely to be tired, shaky, and weak and have muscle aches and abdominal pain. A fever may mean the infection is in your kidneys. Other symptoms of a kidney infection include pain in your back or sides below the ribs, nausea, and vomiting. DIAGNOSIS To diagnose a UTI, your caregiver will ask you about your symptoms. Your caregiver also will ask to provide a urine sample. The urine sample will be tested for bacteria and white blood cells. White blood  cells are made by your body to help fight infection. TREATMENT  Typically, UTIs can be treated with medication. Because most UTIs are caused by a bacterial infection, they usually can be treated with the use of antibiotics. The choice of antibiotic and length of treatment depend on your symptoms and the type of bacteria causing your infection. HOME CARE INSTRUCTIONS  If you were prescribed antibiotics, take them exactly as your caregiver instructs you. Finish the medication even if you feel better after you have only taken some of the medication.  Drink enough water and fluids to keep your urine clear or pale yellow.  Avoid caffeine, tea, and carbonated beverages. They tend to irritate your bladder.  Empty your bladder often. Avoid holding urine for long periods of time.  Empty your bladder before and after sexual intercourse.  After a bowel movement, women should cleanse from front to back. Use each tissue only once. SEEK MEDICAL CARE IF:   You have back pain.  You develop a fever.  Your symptoms do not begin to resolve within 3 days. SEEK IMMEDIATE MEDICAL CARE IF:   You have severe back pain or lower abdominal  pain.  You develop chills.  You have nausea or vomiting.  You have continued burning or discomfort with urination. MAKE SURE YOU:   Understand these instructions.  Will watch your condition.  Will get help right away if you are not doing well or get worse. Document Released: 11/23/2004 Document Revised: 08/15/2011 Document Reviewed: 03/24/2011 [facility] Patient Information 2014 [place].

## 2013-05-14 NOTE — Progress Notes (Signed)
Subjective:    Patient ID: Becky [name], female    DOB: 1930/01/27, 80 y.o.   MRN: [id number]  HPI 80 y.o. White female seen today for a physical. PMH, PSH, social hx discussed in detail with pt, immunizations and routine exams discussed. Current medical problems and medications reviewed and discussed with pt. Pt acknowledges a chief complain of "shortness of breath". Pt states that SOB began 6 months ago, and only occurs with exertion. She states that SOB with be relieved with rest. Pt acknowledges having the feeling of "a lump in my throat" when swallowing. Pt states she has had this for a few months and that she constantly tries to clear her throat, but cannot. Pt denies fever, fatigue, chills and change in appetite.     Review of Systems  HENT: Positive for trouble swallowing.   Eyes: Negative.   Respiratory: Positive for shortness of breath.        With exertion  Cardiovascular: Negative.   Gastrointestinal: Negative.   Endocrine: Negative.   Genitourinary: Negative.   Musculoskeletal: Negative.   Skin: Negative.   Allergic/Immunologic: Negative.   Neurological: Positive for weakness.       To right leg  Hematological: Negative.   Psychiatric/Behavioral: Negative.    Past Medical History  Diagnosis Date  . Polio     PPS  . Arthritis   . Fibromyalgia   . Depression   . Hyperlipidemia   . Ulcer     small- stomach  . Migraines     History   Social History  . Marital Status: Divorced    Spouse Name: N/A    Number of Children: N/A  . Years of Education: N/A   Occupational History  . Not on file.   Social History Main Topics  . Smoking status: Never Smoker   . Smokeless tobacco: Never Used  . Alcohol Use: No  . Drug Use: No  . Sexual Activity: No   Other Topics Concern  . Not on file   Social History Narrative  . No narrative on file    Past Surgical History  Procedure Laterality Date  . Inguinal herniorrhaphy    . Tubal ligation      Family  History  Problem Relation Age of Onset  . Alcohol abuse      addiction   . Arthritis    . Colon cancer      1st degree relative <60  . Diabetes      1st degree relative  . Psychosis    . Arthritis Brother   . Cancer Brother     No Known Allergies  Current Outpatient Prescriptions on File Prior to Visit  Medication Sig Dispense Refill  . acetaminophen (TYLENOL) 500 MG tablet Take 500 mg by mouth every 6 (six) hours as needed.        . Memantine HCl ER (NAMENDA XR TITRATION PACK) 7 & 14 & 21 &28 MG CP24 Take 7 mg by mouth every morning.  28 capsule  5  . NAMENDA XR 28 MG CP24 TAKE 28 MG BY MOUTH ONCE.  30 capsule  3  . sertraline (ZOLOFT) 100 MG tablet Take 100 mg by mouth daily. 2 tablets daily      . Sodium Chloride, Hypertonic, (MURO 128 OP) Apply to eye. One drop in left eye  2-4 times daily      . travoprost, benzalkonium, (TRAVATAN) 0.004 % ophthalmic solution Place 1 drop into the right eye at bedtime.        Marland Kitchen  VITAMIN D, CHOLECALCIFEROL, PO Take by mouth. Vitamin d3,as needed       No current facility-administered medications on file prior to visit.    BP 128/80  Pulse 107  Temp(Src) 98.5 F (36.9 C) (Oral)  Ht 5' (1.524 m)  Wt 135 lb (61.236 kg)  BMI 26.37 kg/m2  SpO2 93%chart    Objective:   Physical Exam  Constitutional: She is oriented to person, place, and time. She appears well-developed and well-nourished. She is active.  HENT:  Head: Normocephalic.  Right Ear: Tympanic membrane, external ear and ear canal normal. Decreased hearing is noted.  Left Ear: Tympanic membrane, external ear and ear canal normal. Decreased hearing is noted.  Nose: Nose normal.  Mouth/Throat: Uvula is midline, oropharynx is clear and moist and mucous membranes are normal.  Eyes: Conjunctivae, EOM and lids are normal. Pupils are equal, round, and reactive to light.  Neck: Normal range of motion. Neck supple.  Cardiovascular: Normal rate, regular rhythm, S1 normal, S2 normal and  normal heart sounds.   Pulses:      Dorsalis pedis pulses are 1+ on the right side, and 1+ on the left side.       Posterior tibial pulses are 1+ on the right side, and 1+ on the left side.  Pulmonary/Chest: Effort normal and breath sounds normal.  Abdominal: Soft. Normal appearance and bowel sounds are normal.  Genitourinary: No breast swelling, tenderness, discharge or bleeding. Pelvic exam was performed with patient prone.  Neurological: She is alert and oriented to person, place, and time. She has normal reflexes.  Skin: Skin is warm, dry and intact.  Psychiatric: She has a normal mood and affect. Her speech is normal and behavior is normal. Thought content normal.          Assessment & Plan:  80 y.o. White female presents today for a annual physical.,  - Health maintenance: 12 lead EKG  - Pneumonia Vaccine  - BMP, LFT, CBC, TSH, UA  - Schedule Mammogram and bone density screening due to high risk  - Difficulty swallowing: Refer to ENT per pt request for evaluation.  - Urinary Tract Infection: Start sulfamethoxazole-trimethoprim 1tab po BID  - SOB: chest xray   education: self breast exams, good nutrition including vegetables and fruits, start brisk walking or water aerobics for exercise.   Follow up: as needed.

## 2013-05-15 LAB — VITAMIN D 25 HYDROXY (VIT D DEFICIENCY, FRACTURES): Vit D, 25-Hydroxy: 24 ng/mL — ABNORMAL LOW (ref 30–89)

## 2013-05-16 ENCOUNTER — Other Ambulatory Visit: Payer: Self-pay | Admitting: Family

## 2013-05-16 MED ORDER — ALBUTEROL SULFATE HFA 108 (90 BASE) MCG/ACT IN AERS: 2.0000 | INHALATION_SPRAY | Freq: Four times a day (QID) | RESPIRATORY_TRACT | Status: DC | PRN

## 2013-06-16 ENCOUNTER — Encounter: Payer: Self-pay | Admitting: Nurse Practitioner

## 2013-06-16 ENCOUNTER — Encounter (INDEPENDENT_AMBULATORY_CARE_PROVIDER_SITE_OTHER): Payer: Self-pay

## 2013-06-16 ENCOUNTER — Ambulatory Visit (INDEPENDENT_AMBULATORY_CARE_PROVIDER_SITE_OTHER): Payer: Medicare Other | Admitting: Nurse Practitioner

## 2013-06-16 VITALS — BP 115/68 | HR 88 | Ht 61.5 in | Wt 134.0 lb

## 2013-06-16 DIAGNOSIS — R413 Other amnesia: Secondary | ICD-10-CM

## 2013-06-16 DIAGNOSIS — G3184 Mild cognitive impairment, so stated: Secondary | ICD-10-CM

## 2013-06-16 MED ORDER — MEMANTINE HCL ER 28 MG PO CP24: 28.0000 mg | ORAL_CAPSULE | Freq: Every day | ORAL | Status: DC

## 2013-06-16 MED ORDER — DONEPEZIL HCL 10 MG PO TABS: 10.0000 mg | ORAL_TABLET | Freq: Every evening | ORAL | Status: DC | PRN

## 2013-06-16 NOTE — Patient Instructions (Signed)
Continue Aricept at current dose will refill Continue Namenda at current dose will refill Follow-up in 6 months 

## 2013-06-16 NOTE — Progress Notes (Signed)
I agree with the assessment and plan as directed by NP .The patient is known to me .   Lemoyne Scarpati, MD  

## 2013-06-16 NOTE — Progress Notes (Signed)
[facility]  PATIENT: [name] DOB: 1929/03/18   REASON FOR VISIT: Followup for mild cognitive impairment    HISTORY OF PRESENT ILLNESS: Ms. Bellisario, 80 year old female returns for followup of her son. She has a history of mild cognitive impairment. She was last seen in this office Dr. Brett Fairy 10/09/2012. She she is currently taking Namenda and Aricept without side effects. She needs refills. She has no new neurologic complaints. Memory is stable   HISTORY: The patient is a 80 year old, right-handed, Caucasian female nonsmoker and reports that she has problems with remembering to take medication, were sometimes has even taken a double doors forgotten that she already took her pills earlier. The patient had originally reported that her sister initiated her visit to see a neurologist the family has noted that since Christmastime 2013 Mrs. Madera had become progressively more forgetful and that there was some irregularity indeed his medication intake. The patient also will sometimes misplaces items but this may not be a new phenomenon. For her past medical history is important that she contracted poorly you at age 69 1/2 in 70. She has been doing water aerobics and she has been followed in [facility] for the past polio syndrome  . In her May visit the patient underwent MOCA, documenting 24 of 30 points losing 5 points in immediate recall of words.  A diagnosis was mild cognitive impairment the patient already had been started on Aricept in its generic form 2 years prior and the added Namenda after her May visit.  The patient feels that her cognitive abilities are unchanged. She has not noticed a decline neither her family members.   REVIEW OF SYSTEMS: Full 14 system review of systems performed and notable only for those listed, all others are neg:  Constitutional: N/A  Cardiovascular: N/A  Ear/Nose/Throat: Hearing loss  Skin: N/A  Eyes: N/A    Respiratory: N/A  Gastroitestinal: N/A  Hematology/Lymphatic: N/A  Endocrine: N/A Musculoskeletal: History of polio 1944 Allergy/Immunology: N/A  Neurological: Mild cognitive impairment  Psychiatric: N/A   ALLERGIES: No Known Allergies  HOME MEDICATIONS: Outpatient Prescriptions Prior to Visit  Medication Sig Dispense Refill  . acetaminophen (TYLENOL) 500 MG tablet Take 500 mg by mouth every 6 (six) hours as needed.        Marland Kitchen albuterol (PROVENTIL HFA;VENTOLIN HFA) 108 (90 BASE) MCG/ACT inhaler Inhale 2 puffs into the lungs every 6 (six) hours as needed for wheezing or shortness of breath.  1 Inhaler  0  . cyanocobalamin (,VITAMIN B-12,) 1000 MCG/ML injection Inject 1 mL (1,000 mcg total) into the muscle every 30 (thirty) days.  10 mL  1  . donepezil (ARICEPT) 10 MG tablet Take 1 tablet (10 mg total) by mouth at bedtime as needed.  90 tablet  0  . Memantine HCl ER (NAMENDA XR TITRATION PACK) 7 & 14 & 21 &28 MG CP24 Take 7 mg by mouth every morning.  28 capsule  5  . sertraline (ZOLOFT) 100 MG tablet Take 100 mg by mouth daily. 2 tablets daily      . Sodium Chloride, Hypertonic, (MURO 128 OP) Apply to eye. One drop in left eye  2-4 times daily      . travoprost, benzalkonium, (TRAVATAN) 0.004 % ophthalmic solution Place 1 drop into the right eye at bedtime.        Marland Kitchen VITAMIN D, CHOLECALCIFEROL, PO Take by mouth. Vitamin d3,as needed      . NAMENDA XR 28 MG CP24 TAKE 28  MG BY MOUTH ONCE.  30 capsule  3   No facility-administered medications prior to visit.    PAST MEDICAL HISTORY: Past Medical History  Diagnosis Date  . Polio     PPS  . Arthritis   . Fibromyalgia   . Depression   . Hyperlipidemia   . Ulcer     small- stomach  . Migraines     PAST SURGICAL HISTORY: Past Surgical History  Procedure Laterality Date  . Inguinal herniorrhaphy    . Tubal ligation      FAMILY HISTORY: Family History  Problem Relation Age of Onset  . Alcohol abuse      addiction   .  Arthritis    . Colon cancer      1st degree relative <60  . Diabetes      1st degree relative  . Psychosis    . Arthritis Brother   . Cancer Brother     SOCIAL HISTORY: History   Social History  . Marital Status: Divorced    Spouse Name: N/A    Number of Children: 3  . Years of Education: Nursing   Occupational History  . Retired    Social History Main Topics  . Smoking status: Never Smoker   . Smokeless tobacco: Never Used  . Alcohol Use: No  . Drug Use: No  . Sexual Activity: No   Other Topics Concern  . Not on file   Social History Narrative   Patient lives at home with her cat.    Patient has 3 children.    Patient has a nursing degree.    Patient is retired.    Patient is right handed.   Patient is divorced.      PHYSICAL EXAM  Filed Vitals:   06/16/13 1410  BP: 115/68  Pulse: 88  Height: 5' 1.5" (1.562 m)  Weight: 134 lb (60.782 kg)   Body mass index is 24.91 kg/(m^2).  Generalized: Well developed, in no acute distress , well groomed Head: normocephalic and atraumatic,. Oropharynx benign  Neck: Supple, no carotid bruits  Cardiac: Regular rate rhythm, no murmur  Musculoskeletal: No deformity   Neurological examination   Mentation: Alert oriented to time, place, history taking. MMSE 27/30. AFT 16. Follows all commands speech and language fluent  Cranial nerve II-XII: Pupils were equal round reactive to light extraocular movements were full, visual field were full on confrontational test.Left eye ptosis. Facial sensation and strength were normal. hearing was intact to finger rubbing bilaterally. Uvula tongue midline. head turning and shoulder shrug were normal and symmetric.Tongue protrusion into cheek strength was normal. Motor: normal bulk and tone, full strength in the BUE, BLE, on the left, mild weakness right lower extremity. (Polio in 1944) Coordination: finger-nose-finger, heel-to-shin bilaterally, no dysmetria Reflexes: Brachioradialis 2/2,  biceps 2/2, triceps 2/2, patellar 2/2, Achilles 2/2, plantar responses were flexor bilaterally. Gait and Station: Rising up from seated position without assistance, wide based stance,  moderate stride, good arm swing, smooth turning, unable to perform tiptoe, and heel walking.  Tandem gait not attempted  DIAGNOSTIC DATA (LABS, IMAGING, TESTING) - I reviewed patient records, labs, notes, testing and imaging myself where available.  Lab Results  Component Value Date   WBC 6.2 05/14/2013   HGB 13.7 05/14/2013   HCT 40.8 05/14/2013   MCV 93.1 05/14/2013   PLT 214.0 05/14/2013      Component Value Date/Time   NA 143 05/14/2013 1150   K 3.4* 05/14/2013 1150   CL 107  05/14/2013 1150   CO2 27 05/14/2013 1150   GLUCOSE 82 05/14/2013 1150   BUN 16 05/14/2013 1150   CREATININE 0.7 05/14/2013 1150   CALCIUM 9.3 05/14/2013 1150   PROT 7.0 05/14/2013 1150   ALBUMIN 3.9 05/14/2013 1150   AST 19 05/14/2013 1150   ALT 21 05/14/2013 1150   ALKPHOS 68 05/14/2013 1150   BILITOT 0.9 05/14/2013 1150   GFRNONAA >60 04/19/2010 0517   GFRAA  Value: >60        The eGFR has been calculated using the MDRD equation. This calculation has not been validated in all clinical situations. eGFR's persistently <60 mL/min signify possible Chronic Kidney Disease. 04/19/2010 0517   Lab Results  Component Value Date   CHOL 280* 05/14/2013   HDL 57.50 05/14/2013   LDLCALC 181* 05/14/2013   LDLDIRECT 119.6 10/24/2011   TRIG 209.0* 05/14/2013   CHOLHDL 5 05/14/2013     Lab Results  Component Value Date   TSH 1.50 05/14/2013      ASSESSMENT AND PLAN  80 y.o. year old female  has a past medical history of Polio; Arthritis; and mild cognitive impairment here for followup. She is currently on Namenda and Aricept without side effects  Continue Aricept at current dose will refill Continue Namenda at current dose will refill Follow up in 6 months Dennie Bible, [facility], [facility], APRN  [facility] [address]  [place], [place] [address] [phone]

## 2013-07-02 ENCOUNTER — Telehealth: Payer: Self-pay | Admitting: Family

## 2013-07-02 NOTE — Telephone Encounter (Signed)
Left message on machine for patient to return our call 

## 2013-07-02 NOTE — Telephone Encounter (Signed)
According to Bone Density report, we need to start medication to help strengthen bones and prevent fractures. I can send in once a week fosamax if she agrees to take it.

## 2013-07-03 NOTE — Telephone Encounter (Signed)
Spoke with daughter and she has been on fosamax before and suffered from a fracture in her thigh.  Her daughter does agree that her mother will need a Rx for her bones but was wondering if there is an alternative?   Daughters number is [phone].

## 2013-07-04 MED ORDER — IBANDRONATE SODIUM 150 MG PO TABS: 150.0000 mg | ORAL_TABLET | ORAL | Status: DC

## 2013-07-04 NOTE — Telephone Encounter (Signed)
Boniva once a month sent to the pharmacy

## 2013-07-04 NOTE — Telephone Encounter (Signed)
Rx faxed

## 2013-07-17 ENCOUNTER — Encounter: Payer: Self-pay | Admitting: Family

## 2013-07-22 ENCOUNTER — Encounter: Payer: Self-pay | Admitting: Family

## 2013-08-01 ENCOUNTER — Telehealth: Payer: Self-pay | Admitting: Family

## 2013-08-01 NOTE — Telephone Encounter (Signed)
[id number] - [place], [place] - [address] is requesting 90 day re-fill ibandronate (BONIVA) 150 MG tablet

## 2013-08-04 MED ORDER — IBANDRONATE SODIUM 150 MG PO TABS: 150.0000 mg | ORAL_TABLET | ORAL | Status: DC

## 2013-08-05 ENCOUNTER — Telehealth: Payer: Self-pay | Admitting: Family

## 2013-08-05 MED ORDER — IBANDRONATE SODIUM 150 MG PO TABS: 150.0000 mg | ORAL_TABLET | ORAL | Status: DC

## 2013-08-05 NOTE — Telephone Encounter (Signed)
Not sure. New Rx has been faxed

## 2013-08-05 NOTE — Telephone Encounter (Signed)
[id number] - [place], [place] - [address] is requesting 90 day re-fill on ibandronate (BONIVA) 150 MG tablet

## 2013-08-05 NOTE — Telephone Encounter (Signed)
Rx was done on 08/04/13.   Was Rx faxed?

## 2013-08-12 ENCOUNTER — Ambulatory Visit (INDEPENDENT_AMBULATORY_CARE_PROVIDER_SITE_OTHER): Payer: Medicare Other | Admitting: Family

## 2013-08-12 ENCOUNTER — Encounter: Payer: Self-pay | Admitting: Family

## 2013-08-12 VITALS — BP 102/68 | HR 85 | Temp 98.6°F | Ht 61.25 in | Wt 138.0 lb

## 2013-08-12 DIAGNOSIS — N390 Urinary tract infection, site not specified: Secondary | ICD-10-CM

## 2013-08-12 DIAGNOSIS — E559 Vitamin D deficiency, unspecified: Secondary | ICD-10-CM

## 2013-08-12 DIAGNOSIS — E876 Hypokalemia: Secondary | ICD-10-CM

## 2013-08-12 DIAGNOSIS — J4489 Other specified chronic obstructive pulmonary disease: Secondary | ICD-10-CM

## 2013-08-12 DIAGNOSIS — E7800 Pure hypercholesterolemia, unspecified: Secondary | ICD-10-CM

## 2013-08-12 DIAGNOSIS — J449 Chronic obstructive pulmonary disease, unspecified: Secondary | ICD-10-CM

## 2013-08-12 LAB — POCT URINALYSIS DIPSTICK
Bilirubin, UA: NEGATIVE
Blood, UA: NEGATIVE
Glucose, UA: NEGATIVE
Ketones, UA: NEGATIVE
Nitrite, UA: NEGATIVE
Protein, UA: NEGATIVE
SPEC GRAV UA: 1.015
UROBILINOGEN UA: 0.2
pH, UA: 6

## 2013-08-12 MED ORDER — SIMVASTATIN 10 MG PO TABS: 10.0000 mg | ORAL_TABLET | Freq: Every day | ORAL | Status: DC

## 2013-08-12 NOTE — Progress Notes (Signed)
Pre visit review using our clinic review tool, if applicable. No additional management support is needed unless otherwise documented below in the visit note. 

## 2013-08-12 NOTE — Progress Notes (Signed)
Subjective:    Patient ID: Becky [name], female    DOB: [birth year], 80 y.o.   MRN: [id number]  HPI 80 year old white female, nonsmoker with a history of hyperlipidemia, COPD, vitamin D deficiency, osteoporosis is in today for recheck. She had physical last done in March did show hypercholesterolemia. At that time, she elected not to take a statin medication for cholesterol. Today, she would like to take medication. She did take the antibiotic to clear UTI. She is on vitamin D weekly and tolerating medication well. She is using her inhaler for COPD as needed. She's never been a smoker but was exposed to secondhand smoke from her deceased at husband.   Review of Systems  Constitutional: Negative.   HENT: Negative.   Respiratory: Negative.   Cardiovascular: Negative.   Gastrointestinal: Negative.   Endocrine: Negative.   Genitourinary: Negative.   Musculoskeletal: Negative.   Skin: Negative.   Neurological: Negative.   Hematological: Negative.   Psychiatric/Behavioral: Negative.    Past Medical History  Diagnosis Date  . Polio     PPS  . Arthritis   . Fibromyalgia   . Depression   . Hyperlipidemia   . Ulcer     small- stomach  . Migraines     History   Social History  . Marital Status: Divorced    Spouse Name: N/A    Number of Children: 3  . Years of Education: Nursing   Occupational History  . Retired    Social History Main Topics  . Smoking status: Never Smoker   . Smokeless tobacco: Never Used  . Alcohol Use: No  . Drug Use: No  . Sexual Activity: No   Other Topics Concern  . Not on file   Social History Narrative   Patient lives at home with her cat.    Patient has 3 children.    Patient has a nursing degree.    Patient is retired.    Patient is right handed.   Patient is divorced.     Past Surgical History  Procedure Laterality Date  . Inguinal herniorrhaphy    . Tubal ligation      Family History  Problem Relation Age of Onset  . Alcohol  abuse      addiction   . Arthritis    . Colon cancer      1st degree relative <60  . Diabetes      1st degree relative  . Psychosis    . Arthritis Brother   . Cancer Brother     No Known Allergies  Current Outpatient Prescriptions on File Prior to Visit  Medication Sig Dispense Refill  . acetaminophen (TYLENOL) 500 MG tablet Take 500 mg by mouth every 6 (six) hours as needed.        Marland Kitchen albuterol (PROVENTIL HFA;VENTOLIN HFA) 108 (90 BASE) MCG/ACT inhaler Inhale 2 puffs into the lungs every 6 (six) hours as needed for wheezing or shortness of breath.  1 Inhaler  0  . cyanocobalamin (,VITAMIN B-12,) 1000 MCG/ML injection Inject 1 mL (1,000 mcg total) into the muscle every 30 (thirty) days.  10 mL  1  . donepezil (ARICEPT) 10 MG tablet Take 1 tablet (10 mg total) by mouth at bedtime as needed.  90 tablet  1  . ibandronate (BONIVA) 150 MG tablet Take 1 tablet (150 mg total) by mouth every 30 (thirty) days. Take in the morning with a full glass of water, on an empty stomach, and do not take  anything else by mouth or lie down for the next 30 min.  1 tablet  5  . ipratropium (ATROVENT) 0.06 % nasal spray Place 2 sprays into both nostrils 4 (four) times daily.      . Memantine HCl ER (NAMENDA XR) 28 MG CP24 Take 28 mg by mouth daily.  30 capsule  6  . mirtazapine (REMERON) 30 MG tablet Take 30 mg by mouth at bedtime. Take a half tab at bedtime      . sertraline (ZOLOFT) 100 MG tablet Take 100 mg by mouth daily. 2 tablets daily      . Sodium Chloride, Hypertonic, (MURO 128 OP) Apply to eye. One drop in left eye  2-4 times daily      . travoprost, benzalkonium, (TRAVATAN) 0.004 % ophthalmic solution Place 1 drop into the right eye at bedtime.        Marland Kitchen VITAMIN D, CHOLECALCIFEROL, PO Take by mouth. Vitamin d3,as needed       No current facility-administered medications on file prior to visit.    BP 102/68  Pulse 85  Temp(Src) 98.6 F (37 C) (Oral)  Ht 5' 1.25" (1.556 m)  Wt 138 lb (62.596 kg)   BMI 25.85 kg/m2chart    Objective:   Physical Exam  Constitutional: She is oriented to person, place, and time. She appears well-developed and well-nourished.  Neck: Normal range of motion. Neck supple. No thyromegaly present.  Cardiovascular: Normal rate, regular rhythm and normal heart sounds.   Pulmonary/Chest: Effort normal and breath sounds normal.  Abdominal: Soft. Bowel sounds are normal.  Musculoskeletal: Normal range of motion.  Neurological: She is alert and oriented to person, place, and time.  Skin: Skin is warm and dry.  Psychiatric: She has a normal mood and affect.          Assessment & Plan:   Problem List Items Addressed This Visit   None    Visit Diagnoses   Pure hypercholesterolemia    -  Primary    Relevant Medications       simvastatin (ZOCOR) tablet    Other Relevant Orders       Basic Metabolic Panel    Unspecified vitamin D deficiency        Relevant Orders       Vitamin D, 1,25-dihydroxy    UTI (urinary tract infection)        Relevant Orders       POCT urinalysis dipstick    Hypokalemia        Relevant Orders       Basic Metabolic Panel    COPD (chronic obstructive pulmonary disease)          Call the office with any questions or concerns. Recheck in 6 weeks and sooner as needed.

## 2013-08-12 NOTE — Patient Instructions (Signed)
Fat and Cholesterol Control Diet  Fat and cholesterol levels in your blood and organs are influenced by your diet. High levels of fat and cholesterol may lead to diseases of the heart, small and large blood vessels, gallbladder, liver, and pancreas.  CONTROLLING FAT AND CHOLESTEROL WITH DIET  Although exercise and lifestyle factors are important, your diet is key. That is because certain foods are known to raise cholesterol and others to lower it. The goal is to balance foods for their effect on cholesterol and more importantly, to replace saturated and trans fat with other types of fat, such as monounsaturated fat, polyunsaturated fat, and omega-3 fatty acids.  On average, a person should consume no more than 15 to 17 g of saturated fat daily. Saturated and trans fats are considered "bad" fats, and they will raise LDL cholesterol. Saturated fats are primarily found in animal products such as meats, butter, and cream. However, that does not mean you need to give up all your favorite foods. Today, there are good tasting, low-fat, low-cholesterol substitutes for most of the things you like to eat. Choose low-fat or nonfat alternatives. Choose round or loin cuts of red meat. These types of cuts are lowest in fat and cholesterol. Chicken (without the skin), fish, veal, and ground turkey breast are great choices. Eliminate fatty meats, such as hot dogs and salami. Even shellfish have little or no saturated fat. Have a 3 oz (85 g) portion when you eat lean meat, poultry, or fish.  Trans fats are also called "partially hydrogenated oils." They are oils that have been scientifically manipulated so that they are solid at room temperature resulting in a longer shelf life and improved taste and texture of foods in which they are added. Trans fats are found in stick margarine, some tub margarines, cookies, crackers, and baked goods.   When baking and cooking, oils are a great substitute for butter. The monounsaturated oils are  especially beneficial since it is believed they lower LDL and raise HDL. The oils you should avoid entirely are saturated tropical oils, such as coconut and palm.   Remember to eat a lot from food groups that are naturally free of saturated and trans fat, including fish, fruit, vegetables, beans, grains (barley, rice, couscous, bulgur wheat), and pasta (without cream sauces).   IDENTIFYING FOODS THAT LOWER FAT AND CHOLESTEROL   Soluble fiber may lower your cholesterol. This type of fiber is found in fruits such as apples, vegetables such as broccoli, potatoes, and carrots, legumes such as beans, peas, and lentils, and grains such as barley. Foods fortified with plant sterols (phytosterol) may also lower cholesterol. You should eat at least 2 g per day of these foods for a cholesterol lowering effect.   Read package labels to identify low-saturated fats, trans fat free, and low-fat foods at the supermarket. Select cheeses that have only 2 to 3 g saturated fat per ounce. Use a heart-healthy tub margarine that is free of trans fats or partially hydrogenated oil. When buying baked goods (cookies, crackers), avoid partially hydrogenated oils. Breads and muffins should be made from whole grains (whole-wheat or whole oat flour, instead of "flour" or "enriched flour"). Buy non-creamy canned soups with reduced salt and no added fats.   FOOD PREPARATION TECHNIQUES   Never deep-fry. If you must fry, either stir-fry, which uses very little fat, or use non-stick cooking sprays. When possible, broil, bake, or roast meats, and steam vegetables. Instead of putting butter or margarine on vegetables, use lemon   and herbs, applesauce, and cinnamon (for squash and sweet potatoes). Use nonfat yogurt, salsa, and low-fat dressings for salads.   LOW-SATURATED FAT / LOW-FAT FOOD SUBSTITUTES  Meats / Saturated Fat (g)  · Avoid: Steak, marbled (3 oz/85 g) / 11 g  · Choose: Steak, lean (3 oz/85 g) / 4 g  · Avoid: Hamburger (3 oz/85 g) / 7  g  · Choose: Hamburger, lean (3 oz/85 g) / 5 g  · Avoid: Ham (3 oz/85 g) / 6 g  · Choose: Ham, lean cut (3 oz/85 g) / 2.4 g  · Avoid: Chicken, with skin, dark meat (3 oz/85 g) / 4 g  · Choose: Chicken, skin removed, dark meat (3 oz/85 g) / 2 g  · Avoid: Chicken, with skin, light meat (3 oz/85 g) / 2.5 g  · Choose: Chicken, skin removed, light meat (3 oz/85 g) / 1 g  Dairy / Saturated Fat (g)  · Avoid: Whole milk (1 cup) / 5 g  · Choose: Low-fat milk, 2% (1 cup) / 3 g  · Choose: Low-fat milk, 1% (1 cup) / 1.5 g  · Choose: Skim milk (1 cup) / 0.3 g  · Avoid: Hard cheese (1 oz/28 g) / 6 g  · Choose: Skim milk cheese (1 oz/28 g) / 2 to 3 g  · Avoid: Cottage cheese, 4% fat (1 cup) / 6.5 g  · Choose: Low-fat cottage cheese, 1% fat (1 cup) / 1.5 g  · Avoid: Ice cream (1 cup) / 9 g  · Choose: Sherbet (1 cup) / 2.5 g  · Choose: Nonfat frozen yogurt (1 cup) / 0.3 g  · Choose: Frozen fruit bar / trace  · Avoid: Whipped cream (1 tbs) / 3.5 g  · Choose: Nondairy whipped topping (1 tbs) / 1 g  Condiments / Saturated Fat (g)  · Avoid: Mayonnaise (1 tbs) / 2 g  · Choose: Low-fat mayonnaise (1 tbs) / 1 g  · Avoid: Butter (1 tbs) / 7 g  · Choose: Extra light margarine (1 tbs) / 1 g  · Avoid: Coconut oil (1 tbs) / 11.8 g  · Choose: Olive oil (1 tbs) / 1.8 g  · Choose: Corn oil (1 tbs) / 1.7 g  · Choose: Safflower oil (1 tbs) / 1.2 g  · Choose: Sunflower oil (1 tbs) / 1.4 g  · Choose: Soybean oil (1 tbs) / 2.4 g  · Choose: Canola oil (1 tbs) / 1 g  Document Released: 02/13/2005 Document Revised: 06/10/2012 Document Reviewed: 08/04/2010  [employer]® Patient Information ©2014 [employer].

## 2013-08-13 LAB — BASIC METABOLIC PANEL
BUN: 15 mg/dL (ref 6–23)
CALCIUM: 9.4 mg/dL (ref 8.4–10.5)
CO2: 30 meq/L (ref 19–32)
CREATININE: 0.7 mg/dL (ref 0.4–1.2)
Chloride: 106 mEq/L (ref 96–112)
GFR: 87.96 mL/min (ref >60.00)
Glucose, Bld: 80 mg/dL (ref 70–99)
Potassium: 4.1 mEq/L (ref 3.5–5.1)
Sodium: 140 mEq/L (ref 135–145)

## 2013-08-15 LAB — VITAMIN D 1,25 DIHYDROXY
VITAMIN D 1, 25 (OH) TOTAL: 72 pg/mL (ref 18–72)
VITAMIN D3 1, 25 (OH): 72 pg/mL

## 2013-09-25 ENCOUNTER — Other Ambulatory Visit (INDEPENDENT_AMBULATORY_CARE_PROVIDER_SITE_OTHER): Payer: Medicare Other

## 2013-09-25 DIAGNOSIS — E559 Vitamin D deficiency, unspecified: Secondary | ICD-10-CM

## 2013-09-25 DIAGNOSIS — E876 Hypokalemia: Secondary | ICD-10-CM

## 2013-09-25 DIAGNOSIS — E7800 Pure hypercholesterolemia, unspecified: Secondary | ICD-10-CM

## 2013-09-25 LAB — BASIC METABOLIC PANEL
BUN: 17 mg/dL (ref 6–23)
CO2: 27 mEq/L (ref 19–32)
Calcium: 9.4 mg/dL (ref 8.4–10.5)
Chloride: 107 mEq/L (ref 96–112)
Creatinine, Ser: 0.7 mg/dL (ref 0.4–1.2)
GFR: 79.76 mL/min (ref >60.00)
GLUCOSE: 93 mg/dL (ref 70–99)
Potassium: 4 mEq/L (ref 3.5–5.1)
SODIUM: 141 meq/L (ref 135–145)

## 2013-09-25 LAB — VITAMIN D 25 HYDROXY (VIT D DEFICIENCY, FRACTURES): VITD: 25.82 ng/mL — ABNORMAL LOW (ref 30.00–100.00)

## 2013-09-29 ENCOUNTER — Telehealth: Payer: Self-pay | Admitting: Family

## 2013-09-29 NOTE — Telephone Encounter (Signed)
Pt daughter called to say that her mom had lab work done on 09/25/13. When they called here with results the cholesterol was not included. Pt daughter is asking if they can use that lab work and check her Mom's cholesterol.

## 2013-09-29 NOTE — Telephone Encounter (Signed)
Unable to reach pt's daughter. Spoke with pt to advise that she will need to come back into the office for the Lipid panel. She will have daughter call me back

## 2013-12-16 ENCOUNTER — Ambulatory Visit (INDEPENDENT_AMBULATORY_CARE_PROVIDER_SITE_OTHER): Payer: Medicare Other | Admitting: Nurse Practitioner

## 2013-12-16 ENCOUNTER — Encounter: Payer: Self-pay | Admitting: Nurse Practitioner

## 2013-12-16 ENCOUNTER — Encounter (INDEPENDENT_AMBULATORY_CARE_PROVIDER_SITE_OTHER): Payer: Self-pay

## 2013-12-16 VITALS — BP 114/72 | HR 80 | Ht 61.5 in | Wt 137.0 lb

## 2013-12-16 DIAGNOSIS — G3184 Mild cognitive impairment, so stated: Secondary | ICD-10-CM

## 2013-12-16 MED ORDER — MEMANTINE HCL ER 28 MG PO CP24
28.0000 mg | ORAL_CAPSULE | Freq: Every day | ORAL | Status: DC
Start: 2013-12-16 — End: 2019-06-11

## 2013-12-16 MED ORDER — DONEPEZIL HCL 10 MG PO TABS: 10.0000 mg | ORAL_TABLET | Freq: Every day | ORAL | Status: DC

## 2013-12-16 NOTE — Patient Instructions (Signed)
Continue Namenda at current dose will refill Continue Aricept at current dose will refill F/U in 6 months next visit with Dr. Brett Fairy

## 2013-12-16 NOTE — Progress Notes (Signed)
[facility]  PATIENT: Becky [name] DOB: 1929-11-04   REASON FOR VISIT: Followup for mild cognitive impairment   HISTORY OF PRESENT ILLNESS:Becky [name], 80 year old female returns for followup of her daughter.  She has a history of mild cognitive impairment. She was last seen in this office 06/16/13. She is currently taking Namenda and Aricept without side effects. She needs refills. She has no new neurologic complaints. Memory is stable. She remains active with her church. She no longer drives. She denies any falls since last seen. Appetite is  reportedly good . She is sleeping well. She is on antidepressants from Dr. Arvil Persons office. She returns for reevaluation HISTORY: The patient is a 80 year old, right-handed, Caucasian female nonsmoker and reports that she has problems with remembering to take medication, were sometimes has even taken a double doors forgotten that she already took her pills earlier. The patient had originally reported that her sister initiated her visit to see a neurologist the family has noted that since Christmastime 2013 Becky [name] had become progressively more forgetful and that there was some irregularity indeed his medication intake. The patient also will sometimes misplaces items but this may not be a new phenomenon. For her past medical history is important that she contracted poorly you at age 67 1/2 in 22. She has been doing water aerobics and she has been followed in [facility] for the past polio syndrome  . In her May visit the patient underwent MOCA, documenting 24 of 30 points losing 5 points in immediate recall of words.  A diagnosis was mild cognitive impairment the patient already had been started on Aricept in its generic form 2 years prior and the added Namenda after her May visit.  The patient feels that her cognitive abilities are unchanged. She has not noticed a decline neither her family members.   REVIEW OF  SYSTEMS: Full 14 system review of systems performed and notable only for those listed, all others are neg:  Constitutional: N/A  Cardiovascular: N/A  Ear/Nose/Throat: N/A  Skin: N/A  Eyes: N/A  Respiratory: N/A  Gastroitestinal: N/A  Hematology/Lymphatic: N/A  Endocrine: N/A Musculoskeletal:N/A  Allergy/Immunology: N/A  Neurological: Memory loss Psychiatric: Depression Sleep : NA   ALLERGIES: No Known Allergies  HOME MEDICATIONS: Outpatient Prescriptions Prior to Visit  Medication Sig Dispense Refill  . acetaminophen (TYLENOL) 500 MG tablet Take 500 mg by mouth every 6 (six) hours as needed.        Marland Kitchen albuterol (PROVENTIL HFA;VENTOLIN HFA) 108 (90 BASE) MCG/ACT inhaler Inhale 2 puffs into the lungs every 6 (six) hours as needed for wheezing or shortness of breath.  1 Inhaler  0  . buPROPion (WELLBUTRIN XL) 150 MG 24 hr tablet Take 150 mg by mouth daily.      . cyanocobalamin (,VITAMIN B-12,) 1000 MCG/ML injection Inject 1 mL (1,000 mcg total) into the muscle every 30 (thirty) days.  10 mL  1  . donepezil (ARICEPT) 10 MG tablet Take 1 tablet (10 mg total) by mouth at bedtime as needed.  90 tablet  1  . ibandronate (BONIVA) 150 MG tablet Take 1 tablet (150 mg total) by mouth every 30 (thirty) days. Take in the morning with a full glass of water, on an empty stomach, and do not take anything else by mouth or lie down for the next 30 min.  1 tablet  5  . ipratropium (ATROVENT) 0.06 % nasal spray Place 2 sprays into both nostrils 4 (four) times daily.      Marland Kitchen  Memantine HCl ER (NAMENDA XR) 28 MG CP24 Take 28 mg by mouth daily.  30 capsule  6  . mirtazapine (REMERON) 30 MG tablet Take 30 mg by mouth at bedtime. Take 1/4 tab at bedtime      . sertraline (ZOLOFT) 100 MG tablet Take 100 mg by mouth daily. 2 tablets daily      . simvastatin (ZOCOR) 10 MG tablet Take 1 tablet (10 mg total) by mouth at bedtime.  30 tablet  3  . Sodium Chloride, Hypertonic, (MURO 128 OP) Apply to eye. One drop in  left eye  2-4 times daily      . travoprost, benzalkonium, (TRAVATAN) 0.004 % ophthalmic solution Place 1 drop into the right eye at bedtime.        Marland Kitchen VITAMIN D, CHOLECALCIFEROL, PO Take by mouth. Vitamin d3,as needed       No facility-administered medications prior to visit.    PAST MEDICAL HISTORY: Past Medical History  Diagnosis Date  . Polio     PPS  . Arthritis   . Fibromyalgia   . Depression   . Hyperlipidemia   . Ulcer     small- stomach  . Migraines     PAST SURGICAL HISTORY: Past Surgical History  Procedure Laterality Date  . Inguinal herniorrhaphy    . Tubal ligation      FAMILY HISTORY: Family History  Problem Relation Age of Onset  . Alcohol abuse      addiction   . Arthritis    . Colon cancer      1st degree relative <60  . Diabetes      1st degree relative  . Psychosis    . Arthritis Brother   . Cancer Brother     SOCIAL HISTORY: History   Social History  . Marital Status: Divorced    Spouse Name: N/A    Number of Children: 3  . Years of Education: Nursing   Occupational History  . Retired    Social History Main Topics  . Smoking status: Never Smoker   . Smokeless tobacco: Never Used  . Alcohol Use: No  . Drug Use: No  . Sexual Activity: No   Other Topics Concern  . Not on file   Social History Narrative   Patient lives at home with her cat.    Patient has 3 children.    Patient has a nursing degree.    Patient is retired.    Patient is right handed.   Patient is divorced.      PHYSICAL EXAM  Filed Vitals:   12/16/13 1508  BP: 114/72  Pulse: 80  Height: 5' 1.5" (1.562 m)  Weight: 137 lb (62.143 kg)   Body mass index is 25.47 kg/(m^2). Generalized: Well developed, in no acute distress , well groomed  Head: normocephalic and atraumatic,. Oropharynx benign  Neck: Supple, no carotid bruits  Cardiac: Regular rate rhythm, no murmur  Musculoskeletal: No deformity  Neurological examination  Mentation: Alert oriented to  time, place, history taking. MMSE 28/30. AFT 8. Follows all commands speech and language fluent  Cranial nerve II-XII: Pupils were equal round reactive to light extraocular movements were full, visual field were full on confrontational test.Left eye ptosis. Facial sensation and strength were normal. hearing was intact to finger rubbing bilaterally. Uvula tongue midline. head turning and shoulder shrug were normal and symmetric.Tongue protrusion into cheek strength was normal.  Motor: normal bulk and tone, full strength in the BUE, BLE, on the left,  mild weakness right lower extremity. (Polio in 1944)  Coordination: finger-nose-finger, heel-to-shin bilaterally, no dysmetria  Reflexes: Brachioradialis 2/2, biceps 2/2, triceps 2/2, patellar 2/2, Achilles 2/2, plantar responses were flexor bilaterally.  Gait and Station: Rising up from seated position without assistance, wide based stance, moderate stride, good arm swing, smooth turning, unable to perform tiptoe, and heel walking. Tandem gait not attempted. Uses single-point cane   DIAGNOSTIC DATA (LABS, IMAGING, TESTING) - I reviewed patient records, labs, notes, testing and imaging myself where available.  Lab Results  Component Value Date   WBC 6.2 05/14/2013   HGB 13.7 05/14/2013   HCT 40.8 05/14/2013   MCV 93.1 05/14/2013   PLT 214.0 05/14/2013      Component Value Date/Time   NA 141 09/25/2013 1152   K 4.0 09/25/2013 1152   CL 107 09/25/2013 1152   CO2 27 09/25/2013 1152   GLUCOSE 93 09/25/2013 1152   BUN 17 09/25/2013 1152   CREATININE 0.7 09/25/2013 1152   CALCIUM 9.4 09/25/2013 1152   PROT 7.0 05/14/2013 1150   ALBUMIN 3.9 05/14/2013 1150   AST 19 05/14/2013 1150   ALT 21 05/14/2013 1150   ALKPHOS 68 05/14/2013 1150   BILITOT 0.9 05/14/2013 1150   GFRNONAA >60 04/19/2010 0517   GFRAA  Value: >60        The eGFR has been calculated using the MDRD equation. This calculation has not been validated in all clinical situations. eGFR's persistently <60  mL/min signify possible Chronic Kidney Disease. 04/19/2010 0517   Lab Results  Component Value Date   CHOL 280* 05/14/2013   HDL 57.50 05/14/2013   LDLCALC 181* 05/14/2013   LDLDIRECT 119.6 10/24/2011   TRIG 209.0* 05/14/2013   CHOLHDL 5 05/14/2013    Lab Results  Component Value Date   VITAMINB12 183* 03/20/2012   Lab Results  Component Value Date   TSH 1.50 05/14/2013      ASSESSMENT AND PLAN  80 y.o. year old female  has a past medical history of Polio; Arthritis; mild cognitive impairment and Depression;  here to followup. Memory score is stable  Continue Namenda at current dose will refill Continue Aricept at current dose will refill F/U in 6 months next visit with Dr. De Nurse, [facility], [facility], APRN  [facility] [address] [place], [place] [address] [phone]

## 2013-12-17 NOTE — Progress Notes (Signed)
I agree with the assessment and plan as directed by NP .The patient is known to me .   Onie Kasparek, MD  

## 2014-01-28 ENCOUNTER — Other Ambulatory Visit: Payer: Self-pay | Admitting: Family

## 2014-04-13 ENCOUNTER — Telehealth: Payer: Self-pay | Admitting: Family

## 2014-04-13 NOTE — Telephone Encounter (Signed)
Pt will need refill of simvastatin (ZOCOR) 10 MG tablet but needs ov.  Is it  ok to schedule a fu w/ padonda? Pt has appt w/ dr Maudie Mercury in 2017

## 2014-04-13 NOTE — Telephone Encounter (Signed)
Pt has been scheduled.  °

## 2014-04-13 NOTE — Telephone Encounter (Signed)
Okay to schedule f/u.

## 2014-04-21 ENCOUNTER — Encounter: Payer: Self-pay | Admitting: Family Medicine

## 2014-04-21 ENCOUNTER — Telehealth: Payer: Self-pay | Admitting: Neurology

## 2014-04-21 ENCOUNTER — Ambulatory Visit (INDEPENDENT_AMBULATORY_CARE_PROVIDER_SITE_OTHER): Payer: Medicare Other | Admitting: Family Medicine

## 2014-04-21 VITALS — BP 102/72 | HR 93 | Temp 98.8°F | Ht 61.5 in | Wt 136.0 lb

## 2014-04-21 DIAGNOSIS — M25552 Pain in left hip: Secondary | ICD-10-CM

## 2014-04-21 NOTE — Telephone Encounter (Signed)
Patient's daughter, Treasa School @ [phone], feels dementia is gradually worsening, pt still taking Rx Memantine HCl ER (NAMENDA XR) 28 MG CP24 and Rx donepezil (ARICEPT) 10 MG tablet.  Stated patient is gradually sleeping more and questioning if this could be a side effect from medication.  Please call and advise.

## 2014-04-21 NOTE — Progress Notes (Signed)
Pre visit review using our clinic review tool, if applicable. No additional management support is needed unless otherwise documented below in the visit note. 

## 2014-04-21 NOTE — Telephone Encounter (Signed)
Called and spoke to Miller @ [phone] . Treasa School states she know's her mother mother memory is going to get worse and she is taking her RX'S like she should. Lila just want's Hoyle Sauer to be aware that she is in bed every day until 2 or 3 pm. Lila want's to know if there is anything else she can do on her end so she want sleep so long ? Please advise me. Patient has a follow up apt. With Dr. Brett Fairy in April .

## 2014-04-21 NOTE — Patient Instructions (Signed)
-  can use tylenol 500-1000mg  up to 3 times daily as needed for pain, follow up in 1 month if persists  -follow up with [name] as scheduled for regular follow up of other health problems

## 2014-04-21 NOTE — Progress Notes (Signed)
HPI:   Becky [name] is an 81 yo F pt of Becky [name] with PMH sig for significant dementia, arthritis, scoliosis, fibromyalgia, remote polio her for an acute visit for:  Hip pain: -daughter is her - daughter gives her medicines to her -daughter reports mild L hip pain for about 1 week -pt report has not noticed the pain today - only hurts occ when first gets up in the morning -denies: weakness, numbness, fevers, fall, malaise  ROS: See pertinent positives and negatives per HPI.  Past Medical History  Diagnosis Date  . Polio     PPS  . Arthritis   . Fibromyalgia   . Depression   . Hyperlipidemia   . Ulcer     small- stomach  . Migraines     Past Surgical History  Procedure Laterality Date  . Inguinal herniorrhaphy    . Tubal ligation      Family History  Problem Relation Age of Onset  . Alcohol abuse      addiction   . Arthritis    . Colon cancer      1st degree relative <60  . Diabetes      1st degree relative  . Psychosis    . Arthritis Brother   . Cancer Brother     History   Social History  . Marital Status: Divorced    Spouse Name: N/A  . Number of Children: 3  . Years of Education: Nursing   Occupational History  . Retired    Social History Main Topics  . Smoking status: Never Smoker   . Smokeless tobacco: Never Used  . Alcohol Use: No  . Drug Use: No  . Sexual Activity: No   Other Topics Concern  . None   Social History Narrative   Patient lives at home with her cat.    Patient has 3 children.    Patient has a nursing degree.    Patient is retired.    Patient is right handed.   Patient is divorced.      Current outpatient prescriptions:  .  acetaminophen (TYLENOL) 500 MG tablet, Take 500 mg by mouth every 6 (six) hours as needed.  , Disp: , Rfl:  .  albuterol (PROVENTIL HFA;VENTOLIN HFA) 108 (90 BASE) MCG/ACT inhaler, Inhale 2 puffs into the lungs every 6 (six) hours as needed for wheezing or shortness of breath., Disp: 1  Inhaler, Rfl: 0 .  BRIMONIDINE TARTRATE OP, Apply to eye. Right eye, Disp: , Rfl:  .  buPROPion (WELLBUTRIN XL) 150 MG 24 hr tablet, Take 150 mg by mouth daily., Disp: , Rfl:  .  cyanocobalamin (,VITAMIN B-12,) 1000 MCG/ML injection, Inject 1 mL (1,000 mcg total) into the muscle every 30 (thirty) days., Disp: 10 mL, Rfl: 1 .  donepezil (ARICEPT) 10 MG tablet, Take 1 tablet (10 mg total) by mouth daily., Disp: 90 tablet, Rfl: 1 .  ibandronate (BONIVA) 150 MG tablet, Take 1 tablet (150 mg total) by mouth every 30 (thirty) days. Take in the morning with a full glass of water, on an empty stomach, and do not take anything else by mouth or lie down for the next 30 min., Disp: 1 tablet, Rfl: 5 .  ipratropium (ATROVENT) 0.06 % nasal spray, Place 2 sprays into both nostrils 4 (four) times daily., Disp: , Rfl:  .  Memantine HCl ER (NAMENDA XR) 28 MG CP24, Take 28 mg by mouth daily., Disp: 30 capsule, Rfl: 6 .  mirtazapine (REMERON) 30  MG tablet, Take 30 mg by mouth at bedtime. Take 1/4 tab at bedtime, Disp: , Rfl:  .  Multiple Vitamin (MULTIVITAMIN) capsule, Take 1 capsule by mouth daily., Disp: , Rfl:  .  sertraline (ZOLOFT) 100 MG tablet, Take 100 mg by mouth daily. 2 tablets daily, Disp: , Rfl:  .  simvastatin (ZOCOR) 10 MG tablet, TAKE 1 TABLET (10 MG TOTAL) BY MOUTH AT BEDTIME., Disp: 30 tablet, Rfl: 1 .  Sodium Chloride, Hypertonic, (MURO 128 OP), Apply to eye. One drop in left eye  2-4 times daily, Disp: , Rfl:  .  travoprost, benzalkonium, (TRAVATAN) 0.004 % ophthalmic solution, Place 1 drop into the right eye at bedtime.  , Disp: , Rfl:  .  VITAMIN D, CHOLECALCIFEROL, PO, Take by mouth. Vitamin d3,as needed, Disp: , Rfl:   EXAM:  Filed Vitals:   04/21/14 1555  BP: 102/72  Pulse: 93  Temp: 98.8 F (37.1 C)    Body mass index is 25.28 kg/(m^2).  GENERAL: vitals reviewed and listed above, alert, oriented, appears well hydrated and in no acute distress  HEENT: atraumatic, conjunttiva clear,  no obvious abnormalities on inspection of external nose and ears  NECK: no obvious masses on inspection  LUNGS: clear to auscultation bilaterally, no wheezes, rales or rhonchi, good air movement  CV: HRRR, no peripheral edema  MS: moves all extremities without noticeable abnormality, uses walker, sig spinal scoliosis, gait is ok and does not appear painful, no bony or soft TTP today, good strength and sensation throughout in lower extremities bilaterally  PSYCH: pleasant and cooperative, no obvious depression or anxiety  ASSESSMENT AND PLAN:  Discussed the following assessment and plan:  Hip pain, left  -we discussed possible serious and likely etiologies, workup and treatment, treatment risks and return precautions -after this discussion, Becky [name] and caregiver opted for conservative observation, tylenol prn, as is improving, pt denies pain today, does not appear to be in pain and benign exam -follow up advised with current PCP as scheduled, they plan to follow up with Becky [name] until establish with me -advised they discuss demetia with treating neurologist and plans for living situation as this progresses -of course, we advised Becky [name]  to return or notify a doctor immediately if symptoms worsen or persist or new concerns arise.  -Patient advised to return or notify a doctor immediately if symptoms worsen or persist or new concerns arise.  Patient Instructions  -can use tylenol 500-1000mg  up to 3 times daily as needed for pain, follow up in 1 month if persists  -follow up with Becky [name] as scheduled for regular follow up of other health problems     Becky [name] R.

## 2014-04-21 NOTE — Telephone Encounter (Signed)
TC to daughter. She does not know if patient stays awake at night and sleeps during the day. She is thinking of getting more help in the home because their is no one to over see things at present. I also think this is a good idea. She will investigate options and call back.

## 2014-04-23 ENCOUNTER — Encounter: Payer: Self-pay | Admitting: Family

## 2014-04-23 ENCOUNTER — Ambulatory Visit (INDEPENDENT_AMBULATORY_CARE_PROVIDER_SITE_OTHER): Payer: Medicare Other | Admitting: Family

## 2014-04-23 VITALS — BP 115/64 | HR 87 | Temp 98.1°F | Wt 135.9 lb

## 2014-04-23 DIAGNOSIS — F0390 Unspecified dementia without behavioral disturbance: Secondary | ICD-10-CM | POA: Diagnosis not present

## 2014-04-23 DIAGNOSIS — E039 Hypothyroidism, unspecified: Secondary | ICD-10-CM

## 2014-04-23 DIAGNOSIS — M5416 Radiculopathy, lumbar region: Secondary | ICD-10-CM

## 2014-04-23 DIAGNOSIS — E785 Hyperlipidemia, unspecified: Secondary | ICD-10-CM | POA: Diagnosis not present

## 2014-04-23 LAB — CBC WITH DIFFERENTIAL/PLATELET
BASOS ABS: 0 10*3/uL (ref 0.0–0.1)
Basophils Relative: 0.4 % (ref 0.0–3.0)
EOS ABS: 0.1 10*3/uL (ref 0.0–0.7)
Eosinophils Relative: 1.4 % (ref 0.0–5.0)
HCT: 42.2 % (ref 36.0–46.0)
Hemoglobin: 14.5 g/dL (ref 12.0–15.0)
Lymphocytes Relative: 15.2 % (ref 12.0–46.0)
Lymphs Abs: 1.2 10*3/uL (ref 0.7–4.0)
MCHC: 34.4 g/dL (ref 30.0–36.0)
MCV: 93.1 fl (ref 78.0–100.0)
MONOS PCT: 6.9 % (ref 3.0–12.0)
Monocytes Absolute: 0.5 10*3/uL (ref 0.1–1.0)
NEUTROS ABS: 5.8 10*3/uL (ref 1.4–7.7)
NEUTROS PCT: 76.1 % (ref 43.0–77.0)
PLATELETS: 247 10*3/uL (ref 150.0–400.0)
RBC: 4.53 Mil/uL (ref 3.87–5.11)
RDW: 13.1 % (ref 11.5–15.5)
WBC: 7.7 10*3/uL (ref 4.0–10.5)

## 2014-04-23 LAB — BASIC METABOLIC PANEL
BUN: 17 mg/dL (ref 6–23)
CALCIUM: 9.3 mg/dL (ref 8.4–10.5)
CO2: 29 meq/L (ref 19–32)
Chloride: 103 mEq/L (ref 96–112)
Creatinine, Ser: 0.74 mg/dL (ref 0.40–1.20)
GFR: 79.64 mL/min (ref >60.00)
Glucose, Bld: 100 mg/dL — ABNORMAL HIGH (ref 70–99)
POTASSIUM: 3.5 meq/L (ref 3.5–5.1)
SODIUM: 139 meq/L (ref 135–145)

## 2014-04-23 LAB — LIPID PANEL
CHOL/HDL RATIO: 3
CHOLESTEROL: 234 mg/dL — AB (ref 0–200)
HDL: 80.2 mg/dL (ref >39.00)
LDL CALC: 125 mg/dL — AB (ref 0–99)
NonHDL: 153.8
TRIGLYCERIDES: 142 mg/dL (ref 0.0–149.0)
VLDL: 28.4 mg/dL (ref 0.0–40.0)

## 2014-04-23 LAB — HEPATIC FUNCTION PANEL
ALT: 15 U/L (ref 0–35)
AST: 19 U/L (ref 0–37)
Albumin: 4 g/dL (ref 3.5–5.2)
Alkaline Phosphatase: 52 U/L (ref 39–117)
BILIRUBIN TOTAL: 0.9 mg/dL (ref 0.2–1.2)
Bilirubin, Direct: 0.2 mg/dL (ref 0.0–0.3)
Total Protein: 6.8 g/dL (ref 6.0–8.3)

## 2014-04-23 LAB — TSH: TSH: 1.07 u[IU]/mL (ref 0.35–4.50)

## 2014-04-23 MED ORDER — SIMVASTATIN 10 MG PO TABS: 10.0000 mg | ORAL_TABLET | Freq: Every day | ORAL | Status: DC

## 2014-04-23 NOTE — Progress Notes (Signed)
Pre visit review using our clinic review tool, if applicable. No additional management support is needed unless otherwise documented below in the visit note. 

## 2014-04-23 NOTE — Patient Instructions (Signed)

## 2014-04-23 NOTE — Progress Notes (Signed)
Subjective:    Patient ID: Becky [name], female    DOB: 1929-10-26, 81 y.o.   MRN: [id number]  HPI  81 year old white female, nonsmoker with a history of hypothyroidism, hyperlipidemia, polio syndrome, osteoporosis, and dementia is in today for recheck. Has concerns of left buttocks and hip pain 10 days. Has been taken Tylenol helps her symptoms some. Pain about a 6 out of 10. Worse first thing in the morning. Better with laying down. Denies any injury.  Review of Systems  Constitutional: Negative.   HENT: Negative.   Respiratory: Negative.   Cardiovascular: Negative.   Gastrointestinal: Negative.   Endocrine: Negative.   Genitourinary: Negative.   Musculoskeletal: Negative.   Skin: Negative.   Allergic/Immunologic: Negative.   Neurological: Negative.   Hematological: Negative.   Psychiatric/Behavioral: Negative.    Past Medical History  Diagnosis Date  . Polio     PPS  . Arthritis   . Fibromyalgia   . Depression   . Hyperlipidemia   . Ulcer     small- stomach  . Migraines     History   Social History  . Marital Status: Divorced    Spouse Name: N/A  . Number of Children: 3  . Years of Education: Nursing   Occupational History  . Retired    Social History Main Topics  . Smoking status: Never Smoker   . Smokeless tobacco: Never Used  . Alcohol Use: No  . Drug Use: No  . Sexual Activity: No   Other Topics Concern  . Not on file   Social History Narrative   Patient lives at home with her cat.    Patient has 3 children.    Patient has a nursing degree.    Patient is retired.    Patient is right handed.   Patient is divorced.     Past Surgical History  Procedure Laterality Date  . Inguinal herniorrhaphy    . Tubal ligation      Family History  Problem Relation Age of Onset  . Alcohol abuse      addiction   . Arthritis    . Colon cancer      1st degree relative <60  . Diabetes      1st degree relative  . Psychosis    . Arthritis Brother    . Cancer Brother     No Known Allergies  Current Outpatient Prescriptions on File Prior to Visit  Medication Sig Dispense Refill  . acetaminophen (TYLENOL) 500 MG tablet Take 500 mg by mouth every 6 (six) hours as needed.      Marland Kitchen albuterol (PROVENTIL HFA;VENTOLIN HFA) 108 (90 BASE) MCG/ACT inhaler Inhale 2 puffs into the lungs every 6 (six) hours as needed for wheezing or shortness of breath. 1 Inhaler 0  . BRIMONIDINE TARTRATE OP Apply to eye. Right eye    . buPROPion (WELLBUTRIN XL) 150 MG 24 hr tablet Take 150 mg by mouth daily.    . cyanocobalamin (,VITAMIN B-12,) 1000 MCG/ML injection Inject 1 mL (1,000 mcg total) into the muscle every 30 (thirty) days. 10 mL 1  . donepezil (ARICEPT) 10 MG tablet Take 1 tablet (10 mg total) by mouth daily. 90 tablet 1  . ibandronate (BONIVA) 150 MG tablet Take 1 tablet (150 mg total) by mouth every 30 (thirty) days. Take in the morning with a full glass of water, on an empty stomach, and do not take anything else by mouth or lie down for the next 30 min.  1 tablet 5  . ipratropium (ATROVENT) 0.06 % nasal spray Place 2 sprays into both nostrils 4 (four) times daily.    . Memantine HCl ER (NAMENDA XR) 28 MG CP24 Take 28 mg by mouth daily. 30 capsule 6  . Multiple Vitamin (MULTIVITAMIN) capsule Take 1 capsule by mouth daily.    . sertraline (ZOLOFT) 100 MG tablet Take 100 mg by mouth daily.     . Sodium Chloride, Hypertonic, (MURO 128 OP) Apply to eye. One drop in left eye  2-4 times daily    . travoprost, benzalkonium, (TRAVATAN) 0.004 % ophthalmic solution Place 1 drop into the right eye at bedtime.      Marland Kitchen VITAMIN D, CHOLECALCIFEROL, PO Take by mouth. Vitamin d3,as needed     No current facility-administered medications on file prior to visit.    BP 115/64 mmHg  Pulse 87  Temp(Src) 98.1 F (36.7 C) (Oral)  Wt 135 lb 14.4 oz (61.644 kg)  SpO2 98%chart     Objective:   Physical Exam  Constitutional: She is oriented to person, place, and time. She  appears well-developed and well-nourished.  HENT:  Right Ear: External ear normal.  Left Ear: External ear normal.  Nose: Nose normal.  Mouth/Throat: Oropharynx is clear and moist.  Neck: Normal range of motion. Neck supple.  Cardiovascular: Normal rate, regular rhythm and normal heart sounds.   Pulmonary/Chest: Effort normal and breath sounds normal.  Abdominal: Soft. Bowel sounds are normal.  Musculoskeletal: Normal range of motion.  Neurological: She is alert and oriented to person, place, and time.  Skin: Skin is warm and dry.  Psychiatric: She has a normal mood and affect.          Assessment & Plan:  Continue current medications. Requesting a refill on simvastatin. Labs obtained today will notify patient pending results. Recheck in 4 months and sooner as needed.

## 2014-05-21 DIAGNOSIS — S0502XA Injury of conjunctiva and corneal abrasion without foreign body, left eye, initial encounter: Secondary | ICD-10-CM | POA: Diagnosis not present

## 2014-05-21 DIAGNOSIS — H1851 Endothelial corneal dystrophy: Secondary | ICD-10-CM | POA: Diagnosis not present

## 2014-05-21 DIAGNOSIS — Z961 Presence of intraocular lens: Secondary | ICD-10-CM | POA: Diagnosis not present

## 2014-05-25 ENCOUNTER — Encounter: Payer: Self-pay | Admitting: Neurology

## 2014-05-25 ENCOUNTER — Ambulatory Visit (INDEPENDENT_AMBULATORY_CARE_PROVIDER_SITE_OTHER): Payer: Medicare Other | Admitting: Neurology

## 2014-05-25 VITALS — BP 123/77 | HR 91 | Ht 60.0 in | Wt 138.4 lb

## 2014-05-25 DIAGNOSIS — G14 Postpolio syndrome: Secondary | ICD-10-CM | POA: Diagnosis not present

## 2014-05-25 DIAGNOSIS — F068 Other specified mental disorders due to known physiological condition: Secondary | ICD-10-CM

## 2014-05-25 DIAGNOSIS — F0390 Unspecified dementia without behavioral disturbance: Secondary | ICD-10-CM

## 2014-05-25 MED ORDER — DONEPEZIL HCL 23 MG PO TABS: 23.0000 mg | ORAL_TABLET | Freq: Every day | ORAL | Status: DC

## 2014-05-25 NOTE — Progress Notes (Signed)
[facility]  PATIENT: Becky [name] DOB: [birth year]   REASON FOR VISIT: Followup for mild cognitive impairment   HISTORY OF PRESENT ILLNESS:Becky [name], 81 year old female returns for followup of her daughter.  She has a history of mild cognitive impairment. She was last seen in this office 06/16/13. She is currently taking Namenda and Aricept without side effects. She needs refills. She has no new neurologic complaints. Memory is stable. She remains active with her church. She no longer drives. She denies any falls since last seen. Appetite is  reportedly good . She is sleeping well. She is on antidepressants from Dr. Arvil Persons office. She returns for reevaluation.  HISTORY: The patient is a 80 year old, right-handed, Caucasian female nonsmoker and reports that she has problems with remembering to take medication, were sometimes has even taken a double doors forgotten that she already took her pills earlier. The patient had originally reported that her sister initiated her visit to see a neurologist the family has noted that since Christmastime 2013 Becky [name] had become progressively more forgetful and that there was some irregularity indeed his medication intake. The patient also will sometimes misplaces items but this may not be a new phenomenon. For her past medical history is important that she contracted poorly you at age 15 1/2 in 55. She has been doing water aerobics and she has been followed in [facility] for the past polio syndrome  . In her May visit the patient underwent MOCA, documenting 24 of 30 points losing 5 points in immediate recall of words.  A diagnosis was mild cognitive impairment the patient already had been started on Aricept in its generic form 2 years prior and the added Namenda after her May visit. MMSE 28/30. AFT 8. The patient feels that her cognitive abilities are unchanged. She has not noticed a decline neither her family  members.  PLAN per Cecille Rubin, NP  81 y.o. year old female  has a past medical history of Polio; Arthritis; mild cognitive impairment and Depression;  here to followup.  Memory score is stable  Continue Namenda at current dose will refill Continue Aricept at current dose will refill F/U in 6 months next visit with Dr. Olene Floss Cecille Rubin, GNP, [facility], APRN    Interval history from 3-20 8-16.  Paisyn Guercio is here today for a follow-up visit 6 months ago she scored 26 out of 30 points in a Mini-Mental Status Examination today she scored 25 out of 30 points. In addition a [place] cognitive assessment was performed and here she scored only 20 out of 30 points. Her difficulties were with a clock drawing test trail making was difficult and she was unable to copy a cube. She is reportedly a little bit more sleepy in daytime also she states that her nocturnal sleep is sound and refreshing to her. I asked Becky [name] depending on her decreased score in her cognitive testing today would be the person that will make financial decisions for her or legal affairs to be delegated to and she appointed her daughter. By Becky [name] doesn't feel that she is excessively daytime sleepy her family wonders if she sleeps often until the noon time if she wouldn't be woken up. She would also on some day school without getting up for meals or fluid intake. She is more alert in the afternoon and evening and usually has a better appetite than as well. Her regular bedtime is between 11 PM and midnight.  There is a  discrepancy between her daughters observation and the patients perception; she insisted she cooks for herself, her daughter  shook her head , stating no. Daughter reports her mother sleeps often to afternoon, that's when the medication are taken. The bed time  has been extended from 11 Pm to 12 .00 noon.  She has not much  daylight exposure . Her gait instability requires a family member to be with her.      REVIEW OF SYSTEMS: Full 14 system review of systems performed and notable only for those listed, all others are neg:   The patient herself reports good appetite, sound nocturnal sleep and feels not excessively daytime sleepy due to her long-standing polio history she does have a slight difference in leg muscle tone muscle mass and gait width.  PAST MEDICAL HISTORY: Past Medical History  Diagnosis Date  . Polio     PPS  (age 6 yr)  . Arthritis   . Fibromyalgia   . Depression   . Hyperlipidemia   . Ulcer     small- stomach  . Migraines   . Fuchs' corneal dystrophy     L eye  . Glaucoma     R eye    PAST SURGICAL HISTORY: Past Surgical History  Procedure Laterality Date  . Inguinal herniorrhaphy    . Tubal ligation      FAMILY HISTORY: Family History  Problem Relation Age of Onset  . Alcohol abuse      addiction   . Arthritis    . Colon cancer      1st degree relative <60  . Diabetes      1st degree relative  . Psychosis    . Arthritis Brother   . Cancer Brother     SOCIAL HISTORY: History   Social History  . Marital Status: Divorced    Spouse Name: N/A  . Number of Children: 3  . Years of Education: Nursing   Occupational History  . Retired    Social History Main Topics  . Smoking status: Never Smoker   . Smokeless tobacco: Never Used  . Alcohol Use: No  . Drug Use: No  . Sexual Activity: No   Other Topics Concern  . Not on file   Social History Narrative   Patient lives at home with her cat.    Patient has 3 children.    Patient has a nursing degree.    Patient is retired.    Patient is right handed.   Patient is divorced.    Caffeine  1 cup daily avg. (almost)     PHYSICAL EXAM  Filed Vitals:   05/25/14 1324  BP: 123/77  Pulse: 91  Height: 5' (1.524 m)  Weight: 138 lb 6.4 oz (62.778 kg)   Body mass index is 27.03 kg/(m^2). Generalized: Well developed, in no acute distress , well groomed  Head: normocephalic and  atraumatic,. Oropharynx benign  Neck: Supple, no carotid bruits  Cardiac: Regular rate rhythm, no murmur  Musculoskeletal: No deformity  Neurological examination  Mentation: Alert oriented to time, place, history taking.  Follows all commands speech and language fluent . MOCA 20-30 , MMSE  26-30  / On 05-25-14.  Cranial nerve II-XII: Pupils were equal round reactive to light extraocular movements were full, visual field were full on confrontational test. Cornea is swollen and she had recently a blister. vision is decreased.  Left eye ptosis. Facial sensation and strength were normal. hearing was intact to finger rubbing bilaterally. Uvula tongue  midline. head turning and shoulder shrug were normal and symmetric.Tongue protrusion into cheek strength was normal.  Motor: normal bulk and tone, full strength in the BUE, BLE, on the left.  mild weakness right lower extremity. (Polio in [place]) .  Coordination: finger-nose-finger, heel-to-shin bilaterally, no dysmetria . Reflexes:  2/2, plantar responses were flexor bilaterally.  Gait and Station: Rising up from seated position,  without assistance, wide based stance, moderate stride, good arm swing,  smooth turning, unable to perform tiptoe, and heel walking.  Tandem gait  Attempted but due to unsteadiness deferred.  Uses single-point cane   DIAGNOSTIC DATA (LABS, IMAGING, TESTING) - I reviewed patient records, labs, notes, testing and imaging myself where available.  Lab Results  Component Value Date   WBC 7.7 04/23/2014   HGB 14.5 04/23/2014   HCT 42.2 04/23/2014   MCV 93.1 04/23/2014   PLT 247.0 04/23/2014      Component Value Date/Time   NA 139 04/23/2014 1216   K 3.5 04/23/2014 1216   CL 103 04/23/2014 1216   CO2 29 04/23/2014 1216   GLUCOSE 100* 04/23/2014 1216   BUN 17 04/23/2014 1216   CREATININE 0.74 04/23/2014 1216   CALCIUM 9.3 04/23/2014 1216   PROT 6.8 04/23/2014 1216   ALBUMIN 4.0 04/23/2014 1216   AST 19 04/23/2014 1216    ALT 15 04/23/2014 1216   ALKPHOS 52 04/23/2014 1216   BILITOT 0.9 04/23/2014 1216   GFRNONAA >60 04/19/2010 0517   GFRAA  04/19/2010 0517    >60        The eGFR has been calculated using the MDRD equation. This calculation has not been validated in all clinical situations. eGFR's persistently <60 mL/min signify possible Chronic Kidney Disease.   Lab Results  Component Value Date   CHOL 234* 04/23/2014   HDL 80.20 04/23/2014   LDLCALC 125* 04/23/2014   LDLDIRECT 119.6 10/24/2011   TRIG 142.0 04/23/2014   CHOLHDL 3 04/23/2014    Lab Results  Component Value Date   VITAMINB12 183* 03/20/2012   Lab Results  Component Value Date   TSH 1.07 04/23/2014      ASSESSMENT AND PLAN    The patient continues at this time to live alone. There are differences in perception and reported daytime sleepiness alertness and activities that would be required to maintain independent living. I would think this is a slow progression of the dementia process. She already is on Aricept and Namenda, I will increase the Aricept today from 10 mg to 23 mg which is the highest currently available dose of Aricept. In addition I would strongly recommend to prepare her legal paper for financial affairs or legal affairs those that Mrs. Kerman should she reach a point where she cannot make decisions has things in order.   I would like for her to advance her bedtime by about 30 minutes and for this I recommend to take 3 mg of melatonin 30 minutes prior to the intended bedtime. She may start at 10:30 to see how she tolerates melatonin. Melatonin also helps in memory loss patients that act out dreams or half vivid dreams. I have not been reported that there are any of these events I do think that this demented process has slightly progressed. At this time again I think she should discuss with her daughter how to handle future legal and financial problems on her behalf.  The patient is at this time not ready to  move into a senior citizen center or a  assisted living facility but to she seems at least to be interested to visit some or look around what is available to her.  [facility] [address], [place], [place] [address] [phone]

## 2014-06-01 DIAGNOSIS — H1812 Bullous keratopathy, left eye: Secondary | ICD-10-CM | POA: Diagnosis not present

## 2014-06-01 DIAGNOSIS — Z961 Presence of intraocular lens: Secondary | ICD-10-CM | POA: Diagnosis not present

## 2014-06-01 DIAGNOSIS — H1851 Endothelial corneal dystrophy: Secondary | ICD-10-CM | POA: Diagnosis not present

## 2014-06-01 DIAGNOSIS — H401232 Low-tension glaucoma, bilateral, moderate stage: Secondary | ICD-10-CM | POA: Diagnosis not present

## 2014-06-02 ENCOUNTER — Telehealth: Payer: Self-pay | Admitting: Neurology

## 2014-06-02 NOTE — Telephone Encounter (Signed)
Pt's daughter is calling stating the donepezil (ARICEPT) 23 MG TABS tablet was increased and since then she seems more tired and less appetite no nausea.  Do you think Aricept is the causes of this and if so she it be decreased back to 10 mg? She has not started the melatonin yet.  Please call and advise

## 2014-06-03 NOTE — Telephone Encounter (Signed)
I would recommend her back down to 10 mg as higer dose may be explaining her side effects

## 2014-06-03 NOTE — Telephone Encounter (Signed)
I called and spoke to [facility], daughter about going back to 10mg  aricept qhs because the fatigue and decreased appetite are SE.  She may try again at a later time and see if causes same type of problem.  She verbalized udnerstanding.

## 2014-06-05 ENCOUNTER — Ambulatory Visit (INDEPENDENT_AMBULATORY_CARE_PROVIDER_SITE_OTHER): Payer: Medicare Other

## 2014-06-05 ENCOUNTER — Ambulatory Visit: Payer: Medicare Other

## 2014-06-05 DIAGNOSIS — Z111 Encounter for screening for respiratory tuberculosis: Secondary | ICD-10-CM

## 2014-06-08 LAB — TB SKIN TEST
INDURATION: 0 mm
TB Skin Test: NEGATIVE

## 2014-06-10 ENCOUNTER — Ambulatory Visit (INDEPENDENT_AMBULATORY_CARE_PROVIDER_SITE_OTHER): Payer: Medicare Other | Admitting: Family

## 2014-06-10 ENCOUNTER — Encounter: Payer: Self-pay | Admitting: Family

## 2014-06-10 VITALS — BP 120/84 | Temp 99.1°F | Ht 61.0 in | Wt 132.0 lb

## 2014-06-10 DIAGNOSIS — F32A Depression, unspecified: Secondary | ICD-10-CM

## 2014-06-10 DIAGNOSIS — F0390 Unspecified dementia without behavioral disturbance: Secondary | ICD-10-CM | POA: Diagnosis not present

## 2014-06-10 DIAGNOSIS — F329 Major depressive disorder, single episode, unspecified: Secondary | ICD-10-CM

## 2014-06-10 DIAGNOSIS — E538 Deficiency of other specified B group vitamins: Secondary | ICD-10-CM

## 2014-06-10 DIAGNOSIS — H409 Unspecified glaucoma: Secondary | ICD-10-CM | POA: Diagnosis not present

## 2014-06-10 LAB — VITAMIN B12: VITAMIN B 12: 580 pg/mL (ref 211–911)

## 2014-06-10 NOTE — Patient Instructions (Signed)

## 2014-06-10 NOTE — Progress Notes (Signed)
Subjective:    Patient ID: Becky [name], female    DOB: 27-May-1929, 81 y.o.   MRN: [id number]  HPI 81 y.o. White female presents today with daughter to request referall to an assisted living facility. Daughter states that due to mothers complex medical conditions and needs for medical supervision, that her mother is unable to care for herself appropriately. Pt and mother are in agreement of transitioning to an assisted living facility. Denies fever, fatigue, chills and change of appetite.   Past Medical History  Diagnosis Date  . Polio     PPS  (age 81 yr)  . Arthritis   . Fibromyalgia   . Depression   . Hyperlipidemia   . Ulcer     small- stomach  . Migraines   . Fuchs' corneal dystrophy     L eye  . Glaucoma     R eye    History   Social History  . Marital Status: Divorced    Spouse Name: N/A  . Number of Children: 3  . Years of Education: Nursing   Occupational History  . Retired    Social History Main Topics  . Smoking status: Never Smoker   . Smokeless tobacco: Never Used  . Alcohol Use: No  . Drug Use: No  . Sexual Activity: No   Other Topics Concern  . Not on file   Social History Narrative   Patient lives at home with her cat.    Patient has 3 children.    Patient has a nursing degree.    Patient is retired.    Patient is right handed.   Patient is divorced.    Caffeine  1 cup daily avg. (almost)    Past Surgical History  Procedure Laterality Date  . Inguinal herniorrhaphy    . Tubal ligation      Family History  Problem Relation Age of Onset  . Alcohol abuse      addiction   . Arthritis    . Colon cancer      1st degree relative <60  . Diabetes      1st degree relative  . Psychosis    . Arthritis Brother   . Cancer Brother     No Known Allergies  Current Outpatient Prescriptions on File Prior to Visit  Medication Sig Dispense Refill  . acetaminophen (TYLENOL) 500 MG tablet Take 500 mg by mouth every 6 (six) hours as needed.       Marland Kitchen albuterol (PROVENTIL HFA;VENTOLIN HFA) 108 (90 BASE) MCG/ACT inhaler Inhale 2 puffs into the lungs every 6 (six) hours as needed for wheezing or shortness of breath. 1 Inhaler 0  . BRIMONIDINE TARTRATE OP Apply 1 drop to eye 2 (two) times daily. Right eye    . buPROPion (WELLBUTRIN XL) 150 MG 24 hr tablet Take 150 mg by mouth daily.    . cyanocobalamin (,VITAMIN B-12,) 1000 MCG/ML injection Inject 1 mL (1,000 mcg total) into the muscle every 30 (thirty) days. 10 mL 1  . ibandronate (BONIVA) 150 MG tablet Take 1 tablet (150 mg total) by mouth every 30 (thirty) days. Take in the morning with a full glass of water, on an empty stomach, and do not take anything else by mouth or lie down for the next 30 min. 1 tablet 5  . ipratropium (ATROVENT) 0.06 % nasal spray Place 2 sprays into both nostrils daily as needed.     . Memantine HCl ER (NAMENDA XR) 28 MG CP24 Take  28 mg by mouth daily. 30 capsule 6  . naproxen sodium (ANAPROX) 220 MG tablet Take 220 mg by mouth 2 (two) times daily as needed.    . sertraline (ZOLOFT) 100 MG tablet Take 100 mg by mouth daily.     . simvastatin (ZOCOR) 10 MG tablet Take 1 tablet (10 mg total) by mouth daily at 6 PM. 90 tablet 1  . Sodium Chloride, Hypertonic, (MURO 128 OP) Apply to eye. One drop in left eye  4 times daily  (muro OTC)    . travoprost, benzalkonium, (TRAVATAN) 0.004 % ophthalmic solution Place 1 drop into the right eye at bedtime.      Marland Kitchen VITAMIN D, CHOLECALCIFEROL, PO Take by mouth. Vitamin d3, 5000u twice week.     No current facility-administered medications on file prior to visit.    BP 120/84 mmHg  Temp(Src) 99.1 F (37.3 C) (Oral)  Ht 5\' 1"  (1.549 m)  Wt 132 lb (59.875 kg)  BMI 24.95 kg/m2chart   Review of Systems  Constitutional: Negative.  Negative for fever, chills, activity change and appetite change.  Respiratory: Negative.  Negative for chest tightness and shortness of breath.   Cardiovascular: Negative.  Negative for chest pain.    Gastrointestinal: Negative.  Negative for abdominal distention.  Endocrine: Negative.   Genitourinary: Negative.   Musculoskeletal: Negative.   Skin: Negative.   Allergic/Immunologic: Negative.   Neurological: Negative.  Negative for dizziness, numbness and headaches.  Psychiatric/Behavioral: Negative.        Objective:   Physical Exam  Constitutional: She is oriented to person, place, and time. She appears well-developed and well-nourished. She is active.  HENT:  Right Ear: External ear normal.  Left Ear: External ear normal.  Nose: Nose normal.  Mouth/Throat: Oropharynx is clear and moist.  Neck: Normal range of motion. Neck supple.  Cardiovascular: Normal rate, regular rhythm and normal heart sounds.   Pulmonary/Chest: Effort normal and breath sounds normal.  Abdominal: Soft. Normal appearance and bowel sounds are normal.  Musculoskeletal: Normal range of motion.  Neurological: She is alert and oriented to person, place, and time.  Skin: Skin is warm and dry.  Psychiatric: Her speech is normal and behavior is normal. Her mood appears not anxious. Cognition and memory are impaired. She exhibits a depressed mood.          Assessment & Plan:  Diagnoses and all orders for this visit:  B12 deficiency  Dementia, without behavioral disturbance  Depression  Glaucoma  Forms filled out for assisted living facility. TB skin test applied today.

## 2014-06-10 NOTE — Progress Notes (Signed)
Pre visit review using our clinic review tool, if applicable. No additional management support is needed unless otherwise documented below in the visit note. 

## 2014-06-16 ENCOUNTER — Other Ambulatory Visit: Payer: Self-pay | Admitting: Nurse Practitioner

## 2014-06-16 NOTE — Telephone Encounter (Signed)
Patient was increased to 23mg  at last OV, however, per note on 04/05, she was asked to go back to 10mg  due to side effects.

## 2014-06-17 ENCOUNTER — Ambulatory Visit: Payer: Medicare Other | Admitting: Neurology

## 2014-06-19 ENCOUNTER — Encounter: Payer: Self-pay | Admitting: Family

## 2014-06-19 ENCOUNTER — Ambulatory Visit (INDEPENDENT_AMBULATORY_CARE_PROVIDER_SITE_OTHER): Payer: Medicare Other | Admitting: Family

## 2014-06-19 VITALS — BP 118/70 | HR 109 | Temp 98.4°F | Ht 61.0 in | Wt 135.0 lb

## 2014-06-19 DIAGNOSIS — E559 Vitamin D deficiency, unspecified: Secondary | ICD-10-CM | POA: Diagnosis not present

## 2014-06-19 DIAGNOSIS — B373 Candidiasis of vulva and vagina: Secondary | ICD-10-CM | POA: Diagnosis not present

## 2014-06-19 DIAGNOSIS — B3731 Acute candidiasis of vulva and vagina: Secondary | ICD-10-CM

## 2014-06-19 LAB — VITAMIN D 25 HYDROXY (VIT D DEFICIENCY, FRACTURES): VITD: 29.22 ng/mL — AB (ref 30.00–100.00)

## 2014-06-19 MED ORDER — NYSTATIN 100000 UNIT/GM EX CREA: 1.0000 "application " | TOPICAL_CREAM | Freq: Two times a day (BID) | CUTANEOUS | Status: DC

## 2014-06-19 MED ORDER — FLUCONAZOLE 150 MG PO TABS: 150.0000 mg | ORAL_TABLET | Freq: Once | ORAL | Status: DC

## 2014-06-19 NOTE — Addendum Note (Signed)
Addended by: Townsend Roger D on: 06/19/2014 03:13 PM   Modules accepted: Orders

## 2014-06-19 NOTE — Progress Notes (Signed)
Subjective:    Patient ID: Becky [name], female    DOB: Mar 29, 1929, 81 y.o.   MRN: [id number]  HPI 81 year old white female, nonsmoker with a history of hypothyroidism,dementia, hyperlipidemia, depression, post polio syndrome is in today with complaints of vaginal itching and irritation 2 months off and on. Reports seeing Monistat over-the-counter Possibly helped some but did not clear her symptoms. Denies being sexually active. Daughter requests a Pap smear.   Review of Systems  Constitutional: Negative.   Respiratory: Negative.   Cardiovascular: Negative.   Gastrointestinal: Negative.   Endocrine: Negative.   Genitourinary: Positive for vaginal discharge. Negative for dysuria, urgency and frequency.  Musculoskeletal: Negative.   Skin: Positive for rash.  Allergic/Immunologic: Negative.   Neurological: Negative.   Psychiatric/Behavioral: Negative.    Past Medical History  Diagnosis Date  . Polio     PPS  (age 51 yr)  . Arthritis   . Fibromyalgia   . Depression   . Hyperlipidemia   . Ulcer     small- stomach  . Migraines   . Fuchs' corneal dystrophy     L eye  . Glaucoma     R eye    History   Social History  . Marital Status: Divorced    Spouse Name: N/A  . Number of Children: 3  . Years of Education: Nursing   Occupational History  . Retired    Social History Main Topics  . Smoking status: Never Smoker   . Smokeless tobacco: Never Used  . Alcohol Use: No  . Drug Use: No  . Sexual Activity: No   Other Topics Concern  . Not on file   Social History Narrative   Patient lives at home with her cat.    Patient has 3 children.    Patient has a nursing degree.    Patient is retired.    Patient is right handed.   Patient is divorced.    Caffeine  1 cup daily avg. (almost)    Past Surgical History  Procedure Laterality Date  . Inguinal herniorrhaphy    . Tubal ligation      Family History  Problem Relation Age of Onset  . Alcohol abuse     addiction   . Arthritis    . Colon cancer      1st degree relative <60  . Diabetes      1st degree relative  . Psychosis    . Arthritis Brother   . Cancer Brother     No Known Allergies  Current Outpatient Prescriptions on File Prior to Visit  Medication Sig Dispense Refill  . acetaminophen (TYLENOL) 500 MG tablet Take 500 mg by mouth every 6 (six) hours as needed.      Marland Kitchen albuterol (PROVENTIL HFA;VENTOLIN HFA) 108 (90 BASE) MCG/ACT inhaler Inhale 2 puffs into the lungs every 6 (six) hours as needed for wheezing or shortness of breath. 1 Inhaler 0  . BRIMONIDINE TARTRATE OP Apply 1 drop to eye 2 (two) times daily. Right eye    . buPROPion (WELLBUTRIN XL) 150 MG 24 hr tablet Take 150 mg by mouth daily.    . cyanocobalamin (,VITAMIN B-12,) 1000 MCG/ML injection Inject 1 mL (1,000 mcg total) into the muscle every 30 (thirty) days. 10 mL 1  . donepezil (ARICEPT) 10 MG tablet Take 10 mg by mouth daily.   1  . donepezil (ARICEPT) 10 MG tablet TAKE 1 TABLET (10 MG TOTAL) BY MOUTH DAILY. 90 tablet 1  .  ibandronate (BONIVA) 150 MG tablet Take 1 tablet (150 mg total) by mouth every 30 (thirty) days. Take in the morning with a full glass of water, on an empty stomach, and do not take anything else by mouth or lie down for the next 30 min. 1 tablet 5  . ipratropium (ATROVENT) 0.06 % nasal spray Place 2 sprays into both nostrils daily as needed.     . Memantine HCl ER (NAMENDA XR) 28 MG CP24 Take 28 mg by mouth daily. 30 capsule 6  . naproxen sodium (ANAPROX) 220 MG tablet Take 220 mg by mouth 2 (two) times daily as needed.    . sertraline (ZOLOFT) 100 MG tablet Take 100 mg by mouth daily.     . simvastatin (ZOCOR) 10 MG tablet Take 1 tablet (10 mg total) by mouth daily at 6 PM. 90 tablet 1  . sodium chloride (MURO 128) 5 % ophthalmic ointment Place 1 application into the left eye 4 (four) times daily as needed for irritation.    . Sodium Chloride, Hypertonic, (MURO 128 OP) Apply to eye. One drop in  left eye  4 times daily  (muro OTC)    . travoprost, benzalkonium, (TRAVATAN) 0.004 % ophthalmic solution Place 1 drop into the right eye at bedtime.      Marland Kitchen VITAMIN D, CHOLECALCIFEROL, PO Take by mouth. Vitamin d3, 5000u twice week.     No current facility-administered medications on file prior to visit.    BP 118/70 mmHg  Pulse 109  Temp(Src) 98.4 F (36.9 C) (Oral)  Ht 5\' 1"  (1.549 m)  Wt 135 lb (61.236 kg)  BMI 25.52 kg/m2chart    Objective:   Physical Exam  Constitutional: She is oriented to person, place, and time. She appears well-developed and well-nourished.  Neck: Normal range of motion. Neck supple.  Cardiovascular: Normal rate, regular rhythm and normal heart sounds.   Pulmonary/Chest: Effort normal and breath sounds normal.  Abdominal: Soft. Bowel sounds are normal.  Genitourinary: Vaginal discharge found.  Thick white vaginal discharge noted.  Musculoskeletal: Normal range of motion.  Neurological: She is alert and oriented to person, place, and time.  Skin: Skin is warm and dry. Rash noted. There is erythema.  Labia majora is erythematous, irritated and excoriated.   Psychiatric: She has a normal mood and affect.          Assessment & Plan:  Shiva was seen today for vaginitis.  Diagnoses and all orders for this visit:  Vaginal candida  Vitamin D deficiency Orders: -     Vitamin D, 1,25-dihydroxy  Other orders -     fluconazole (DIFLUCAN) 150 MG tablet; Take 1 tablet (150 mg total) by mouth once. -     nystatin cream (MYCOSTATIN); Apply 1 application topically 2 (two) times daily.   Advised Pap smear is not indicated for an 81 year old female. Verbalized understanding.we'll treat with Diflucan 150 mg once daily. 10 nystatin topically twice a day. Call the office with any questions or concerns. Recheck as scheduled and as needed.

## 2014-06-19 NOTE — Progress Notes (Signed)
Pre visit review using our clinic review tool, if applicable. No additional management support is needed unless otherwise documented below in the visit note. 

## 2014-06-19 NOTE — Patient Instructions (Signed)

## 2014-06-25 ENCOUNTER — Telehealth: Payer: Self-pay | Admitting: Family

## 2014-06-25 MED ORDER — LORAZEPAM 0.5 MG PO TABS: 0.5000 mg | ORAL_TABLET | Freq: Two times a day (BID) | ORAL | Status: DC | PRN

## 2014-06-25 NOTE — Telephone Encounter (Signed)
Spoke with [name]. Ok to send lorazepam 0.5mg  bid prn.

## 2014-06-25 NOTE — Telephone Encounter (Signed)
Patient's daughter states that since has moved to assisted living she has become agitated.  Treasa School would like to know if Abby Potash will fax an order for generic Ativan to [facility]?  (f) [phone], Attn: Nursing Staff

## 2014-06-25 NOTE — Telephone Encounter (Signed)
Rx faxed

## 2014-06-29 DIAGNOSIS — H1851 Endothelial corneal dystrophy: Secondary | ICD-10-CM | POA: Diagnosis not present

## 2014-06-29 DIAGNOSIS — Z961 Presence of intraocular lens: Secondary | ICD-10-CM | POA: Diagnosis not present

## 2014-06-29 DIAGNOSIS — H1812 Bullous keratopathy, left eye: Secondary | ICD-10-CM | POA: Diagnosis not present

## 2014-07-10 ENCOUNTER — Telehealth: Payer: Self-pay | Admitting: Neurology

## 2014-07-10 NOTE — Telephone Encounter (Signed)
Patients daughter called and stated that the patient has moved into an assisted living facility and she is experiencing some issues with anxiety and Treasa School would like to speak with the nurse regarding these issues. Please call and advise ([phone]).

## 2014-07-10 NOTE — Telephone Encounter (Signed)
Spoke with Treasa School, pt's daughter. Pt was moved to an ALF two weeks ago and is having anxiety issues, especially in the afternoons and evenings. Her PCP gave ativan 0.5 mg BID prn but Treasa School is wondering if you have any other suggestions for managing her anxiety and if you would keep her on the ativan. Treasa School reports the pt is taking her aricpet and namenda as directed.

## 2014-07-13 NOTE — Telephone Encounter (Signed)
This patient already is on Zoloft 100 mg 150 mg of Wellbutrin in the morning I do not feel comfortable changing these medications that would help anxiety around as they are prescribed by her psychiatrist. I asked her daughter to make an appointment with a psychiatrist.

## 2014-07-14 ENCOUNTER — Other Ambulatory Visit: Payer: Self-pay | Admitting: *Deleted

## 2014-07-14 ENCOUNTER — Telehealth: Payer: Self-pay | Admitting: Family

## 2014-07-14 NOTE — Telephone Encounter (Signed)
Please advise 

## 2014-07-14 NOTE — Telephone Encounter (Signed)
Pt daughter called to say Mom was treated for a yeast infection on  06/19/14. Daughter said she took  medicine. Daughter said Mom still has a bad yeast infection. Daughter would like to know if the largest dose of DIFLUCAN can be called in with a repeat tablet a few days later. She would like the RX  fax to [facility]. Still having vagina discharge and yeast is very uncomfortable for her.  Daughter phone number [phone]  Fax number [phone]     Pharmacy

## 2014-07-16 MED ORDER — LORAZEPAM 0.5 MG PO TABS: 0.5000 mg | ORAL_TABLET | Freq: Two times a day (BID) | ORAL | Status: DC | PRN

## 2014-07-16 NOTE — Telephone Encounter (Signed)
Diflucan 100mg daily x 7 days 

## 2014-07-16 NOTE — Telephone Encounter (Signed)
Pt's daughter aware and would like scripts faxed to [facility]

## 2014-08-18 ENCOUNTER — Telehealth: Payer: Self-pay

## 2014-08-18 NOTE — Telephone Encounter (Signed)
Received via mail an order from [facility] for Xanax XR 0.5mg  take 1 po bid at 11:30 and Xanax 0.5mg  take 1 po bid at 9am and 4:30pm.  I called and spoke with pt's daughter, Treasa School, to enquire about this because we do not and have not Rxd this medication for her previously. Lila advised that the order should not have come to our office because it was Rx'd by pt's psychiatrist, Dr. Lita Mains. She notes that Dr. Lita Mains d/c lorazepam and is trying pt on Xanax due to her difficulty adjusting to life at the facility.   I will mail order back to [facility] in the self addressed & stamped envelope provided.  Copy sent to scan

## 2014-09-02 ENCOUNTER — Telehealth: Payer: Self-pay | Admitting: *Deleted

## 2014-09-02 NOTE — Telephone Encounter (Signed)
Received FYI fax from [facility] stating patient had a fall at 11:00am on 08/31/14. The writing LPN stated the patient did not sustain any injuries from the fall. Fax sent to scan.

## 2014-10-03 ENCOUNTER — Emergency Department (HOSPITAL_COMMUNITY): Payer: Medicare Other

## 2014-10-03 ENCOUNTER — Encounter (HOSPITAL_COMMUNITY): Payer: Self-pay | Admitting: Emergency Medicine

## 2014-10-03 ENCOUNTER — Emergency Department (HOSPITAL_COMMUNITY)
Admission: EM | Admit: 2014-10-03 | Discharge: 2014-10-04 | Disposition: A | Discharge status: Home / Self Care | Payer: Medicare Other | Attending: Emergency Medicine | Admitting: Emergency Medicine

## 2014-10-03 DIAGNOSIS — Z79899 Other long term (current) drug therapy: Secondary | ICD-10-CM | POA: Diagnosis not present

## 2014-10-03 DIAGNOSIS — E785 Hyperlipidemia, unspecified: Secondary | ICD-10-CM | POA: Diagnosis not present

## 2014-10-03 DIAGNOSIS — Y998 Other external cause status: Secondary | ICD-10-CM | POA: Diagnosis not present

## 2014-10-03 DIAGNOSIS — S199XXA Unspecified injury of neck, initial encounter: Secondary | ICD-10-CM | POA: Diagnosis not present

## 2014-10-03 DIAGNOSIS — S0093XA Contusion of unspecified part of head, initial encounter: Secondary | ICD-10-CM

## 2014-10-03 DIAGNOSIS — Y9389 Activity, other specified: Secondary | ICD-10-CM | POA: Diagnosis not present

## 2014-10-03 DIAGNOSIS — S0190XA Unspecified open wound of unspecified part of head, initial encounter: Secondary | ICD-10-CM | POA: Diagnosis not present

## 2014-10-03 DIAGNOSIS — M1990 Unspecified osteoarthritis, unspecified site: Secondary | ICD-10-CM | POA: Diagnosis not present

## 2014-10-03 DIAGNOSIS — H409 Unspecified glaucoma: Secondary | ICD-10-CM | POA: Diagnosis not present

## 2014-10-03 DIAGNOSIS — Z8619 Personal history of other infectious and parasitic diseases: Secondary | ICD-10-CM | POA: Diagnosis not present

## 2014-10-03 DIAGNOSIS — S0101XA Laceration without foreign body of scalp, initial encounter: Secondary | ICD-10-CM | POA: Diagnosis not present

## 2014-10-03 DIAGNOSIS — W1839XA Other fall on same level, initial encounter: Secondary | ICD-10-CM | POA: Insufficient documentation

## 2014-10-03 DIAGNOSIS — W19XXXA Unspecified fall, initial encounter: Secondary | ICD-10-CM

## 2014-10-03 DIAGNOSIS — R9431 Abnormal electrocardiogram [ECG] [EKG]: Secondary | ICD-10-CM | POA: Diagnosis not present

## 2014-10-03 DIAGNOSIS — F329 Major depressive disorder, single episode, unspecified: Secondary | ICD-10-CM | POA: Insufficient documentation

## 2014-10-03 DIAGNOSIS — S0990XA Unspecified injury of head, initial encounter: Secondary | ICD-10-CM | POA: Diagnosis not present

## 2014-10-03 DIAGNOSIS — S098XXA Other specified injuries of head, initial encounter: Secondary | ICD-10-CM | POA: Diagnosis not present

## 2014-10-03 DIAGNOSIS — Z8679 Personal history of other diseases of the circulatory system: Secondary | ICD-10-CM | POA: Diagnosis not present

## 2014-10-03 DIAGNOSIS — Y92129 Unspecified place in nursing home as the place of occurrence of the external cause: Secondary | ICD-10-CM | POA: Insufficient documentation

## 2014-10-03 DIAGNOSIS — S0191XA Laceration without foreign body of unspecified part of head, initial encounter: Secondary | ICD-10-CM | POA: Diagnosis not present

## 2014-10-03 LAB — CBC WITH DIFFERENTIAL/PLATELET
Basophils Absolute: 0 10*3/uL (ref 0.0–0.1)
Basophils Relative: 0 % (ref 0–1)
EOS ABS: 0.2 10*3/uL (ref 0.0–0.7)
EOS PCT: 2 % (ref 0–5)
HCT: 41.4 % (ref 36.0–46.0)
Hemoglobin: 13.8 g/dL (ref 12.0–15.0)
LYMPHS ABS: 1.2 10*3/uL (ref 0.7–4.0)
LYMPHS PCT: 13 % (ref 12–46)
MCH: 31.2 pg (ref 26.0–34.0)
MCHC: 33.3 g/dL (ref 30.0–36.0)
MCV: 93.7 fL (ref 78.0–100.0)
Monocytes Absolute: 0.8 10*3/uL (ref 0.1–1.0)
Monocytes Relative: 9 % (ref 3–12)
NEUTROS PCT: 76 % (ref 43–77)
Neutro Abs: 7.1 10*3/uL (ref 1.7–7.7)
Platelets: 201 10*3/uL (ref 150–400)
RBC: 4.42 MIL/uL (ref 3.87–5.11)
RDW: 12.4 % (ref 11.5–15.5)
WBC: 9.3 10*3/uL (ref 4.0–10.5)

## 2014-10-03 LAB — BASIC METABOLIC PANEL
ANION GAP: 10 (ref 5–15)
BUN: 14 mg/dL (ref 6–20)
CALCIUM: 9 mg/dL (ref 8.9–10.3)
CO2: 23 mmol/L (ref 22–32)
Chloride: 101 mmol/L (ref 101–111)
Creatinine, Ser: 0.73 mg/dL (ref 0.44–1.00)
GFR calc Af Amer: >60 mL/min (ref >60)
Glucose, Bld: 102 mg/dL — ABNORMAL HIGH (ref 65–99)
Potassium: 3.6 mmol/L (ref 3.5–5.1)
SODIUM: 134 mmol/L — AB (ref 135–145)

## 2014-10-03 MED ORDER — LIDOCAINE HCL (PF) 1 % IJ SOLN
2.0000 mL | Freq: Once | INTRAMUSCULAR | Status: AC
  Administered 2014-10-04: 2 mL via INTRADERMAL
  Filled 2014-10-03: qty 5

## 2014-10-03 NOTE — ED Provider Notes (Signed)
CSN: [id number]     Arrival date & time 10/03/14  2015 History   First MD Initiated Contact with Patient 10/03/14 2033     Chief Complaint  Patient presents with  . Fall  . Head Laceration     (Consider location/radiation/quality/duration/timing/severity/associated sxs/prior Treatment) Patient is a 81 y.o. female presenting with fall. The history is provided by the patient, the nursing home and a relative. No language interpreter was used.  Fall This is a new problem. The current episode started today. The problem occurs rarely. The problem has been resolved. Pertinent negatives include no abdominal pain, anorexia, chest pain, congestion, diaphoresis, fever, headaches, myalgias, nausea, neck pain, numbness, urinary symptoms, vertigo, visual change or vomiting. Nothing aggravates the symptoms. She has tried nothing for the symptoms.   Called and discussed with nursing facility. They state fall was unwitnessed by staff by roommate who is usually alert and oriented saw it occur and notes the bed was not locked and patient slipped when the bed moved as she was getting up.  Past Medical History  Diagnosis Date  . Polio     PPS  (age 15 yr)  . Arthritis   . Fibromyalgia   . Depression   . Hyperlipidemia   . Ulcer     small- stomach  . Migraines   . Fuchs' corneal dystrophy     L eye  . Glaucoma     R eye   Past Surgical History  Procedure Laterality Date  . Inguinal herniorrhaphy    . Tubal ligation     Family History  Problem Relation Age of Onset  . Alcohol abuse      addiction   . Arthritis    . Colon cancer      1st degree relative <60  . Diabetes      1st degree relative  . Psychosis    . Arthritis Brother   . Cancer Brother    History  Substance Use Topics  . Smoking status: Never Smoker   . Smokeless tobacco: Never Used  . Alcohol Use: No   OB History    No data available     Review of Systems  Constitutional: Negative for fever and diaphoresis.  HENT:  Negative for congestion.   Cardiovascular: Negative for chest pain.  Gastrointestinal: Negative for nausea, vomiting, abdominal pain and anorexia.  Musculoskeletal: Negative for myalgias, back pain and neck pain.  Skin: Positive for wound (small laceration to top of scalp, bleeding controlled).  Neurological: Negative for vertigo, seizures, syncope (pt denies syncope, roommate states bed was not locked and patient slid to floor when getting up), light-headedness, numbness and headaches.  Psychiatric/Behavioral: Negative for behavioral problems, confusion, decreased concentration and agitation.      Allergies  Review of patient's allergies indicates no known allergies.  Home Medications   Prior to Admission medications   Medication Sig Start Date End Date Taking? Authorizing Provider  acetaminophen (TYLENOL) 500 MG tablet Take 500 mg by mouth every 6 (six) hours as needed for moderate pain.    Yes Historical Provider, MD  albuterol (PROVENTIL HFA;VENTOLIN HFA) 108 (90 BASE) MCG/ACT inhaler Inhale 2 puffs into the lungs every 6 (six) hours as needed for wheezing or shortness of breath. 05/16/13  Yes Kennyth Arnold, FNP  brimonidine (ALPHAGAN) 0.15 % ophthalmic solution Place 1 drop into the right eye 2 (two) times daily.   Yes Historical Provider, MD  buPROPion (WELLBUTRIN XL) 150 MG 24 hr tablet Take 150 mg by  mouth daily.   Yes Historical Provider, MD  Cholecalciferol (VITAMIN D3) 5000 UNITS CAPS Take 10,000 Units by mouth every Sunday.    Yes Historical Provider, MD  clonazePAM (KLONOPIN) 0.5 MG tablet Take 0.5 mg by mouth 2 (two) times daily.   Yes Historical Provider, MD  donepezil (ARICEPT) 10 MG tablet TAKE 1 TABLET (10 MG TOTAL) BY MOUTH DAILY. 06/16/14  Yes Carmen Dohmeier, MD  ibandronate (BONIVA) 150 MG tablet Take 1 tablet (150 mg total) by mouth every 30 (thirty) days. Take in the morning with a full glass of water, on an empty stomach, and do not take anything else by mouth or lie  down for the next 30 min. 08/05/13  Yes Kennyth Arnold, FNP  Memantine HCl ER (NAMENDA XR) 28 MG CP24 Take 28 mg by mouth daily. 12/16/13  Yes Dennie Bible, NP  Multiple Vitamin (MULTIVITAMIN WITH MINERALS) TABS tablet Take 1 tablet by mouth daily.   Yes Historical Provider, MD  naproxen sodium (ANAPROX) 220 MG tablet Take 220 mg by mouth 2 (two) times daily as needed (FOR PAIN).    Yes Historical Provider, MD  Polyethyl Glycol-Propyl Glycol (SYSTANE) 0.4-0.3 % GEL Place 1 application into the left eye as needed (FOR FUCH'S).   Yes Historical Provider, MD  sertraline (ZOLOFT) 100 MG tablet Take 150 mg by mouth daily.    Yes Historical Provider, MD  simvastatin (ZOCOR) 10 MG tablet Take 1 tablet (10 mg total) by mouth daily at 6 PM. 04/23/14  Yes Kennyth Arnold, FNP  sodium chloride (MURO 128) 5 % ophthalmic ointment Place 1 application into the left eye 5 (five) times daily.    Yes Historical Provider, MD  travoprost, benzalkonium, (TRAVATAN) 0.004 % ophthalmic solution Place 1 drop into the right eye at bedtime.     Yes Historical Provider, MD  cyanocobalamin (,VITAMIN B-12,) 1000 MCG/ML injection Inject 1 mL (1,000 mcg total) into the muscle every 30 (thirty) days. 05/14/13   Kennyth Arnold, FNP  fluconazole (DIFLUCAN) 150 MG tablet Take 1 tablet (150 mg total) by mouth once. 06/19/14   Kennyth Arnold, FNP  ipratropium (ATROVENT) 0.06 % nasal spray Place 2 sprays into both nostrils daily as needed for rhinitis.     Historical Provider, MD  LORazepam (ATIVAN) 0.5 MG tablet Take 1 tablet (0.5 mg total) by mouth 2 (two) times daily as needed for anxiety. 07/16/14   Kennyth Arnold, FNP  nystatin cream (MYCOSTATIN) Apply 1 application topically 2 (two) times daily. 06/19/14   Kennyth Arnold, FNP   BP 111/80 mmHg  Pulse 86  Temp(Src) 98.7 F (37.1 C) (Oral)  Resp 16  Ht 5\' 5"  (1.651 m)  Wt 135 lb (61.236 kg)  BMI 22.47 kg/m2  SpO2 93% Physical Exam  Constitutional: No distress.  Elderly and  frail appearing  HENT:  Head: Normocephalic and atraumatic.  Eyes: Conjunctivae and EOM are normal.  Neck: Normal range of motion. Neck supple.  Cardiovascular: Normal rate, regular rhythm and normal heart sounds.   No murmur heard. Pulmonary/Chest: Effort normal and breath sounds normal.  Abdominal: Soft. Bowel sounds are normal. There is no tenderness. There is no rebound and no guarding.  Musculoskeletal: Normal range of motion. She exhibits no tenderness.  Neurological: She is alert. She has normal strength. No cranial nerve deficit (CN II-XII intact) or sensory deficit (sensation intact throughout). GCS eye subscore is 4. GCS verbal subscore is 5. GCS motor subscore is 6.  Skin: Skin  is warm and dry. Laceration (1 cm laceration to scalp, hemostatic) noted. She is not diaphoretic.  Psychiatric: Her behavior is normal.  Nursing note and vitals reviewed.   ED Course  LACERATION REPAIR Date/Time: 10/04/2014 9:59 PM Performed by: Theodosia Quay Authorized by: Sherwood Gambler Consent: Verbal consent obtained. Consent given by: patient Patient understanding: patient states understanding of the procedure being performed Patient identity confirmed: verbally with patient Body area: head/neck Location details: scalp Laceration length: 2 cm Foreign bodies: no foreign bodies Tendon involvement: none Nerve involvement: none Vascular damage: no Anesthesia: local infiltration Local anesthetic: lidocaine 1% without epinephrine Anesthetic total: 2 ml Patient sedated: no Irrigation solution: tap water Amount of cleaning: standard Debridement: none Degree of undermining: none Skin closure: staples Number of sutures: 3 (staples) Patient tolerance: Patient tolerated the procedure well with no immediate complications   (including critical care time) Labs Review Labs Reviewed  BASIC METABOLIC PANEL - Abnormal; Notable for the following:    Sodium 134 (*)    Glucose, Bld 102 (*)    All  other components within normal limits  CBC WITH DIFFERENTIAL/PLATELET    Imaging Review Ct Head Wo Contrast  10/03/2014   CLINICAL DATA:  Witnessed fall with laceration to the left side of the head.  EXAM: CT HEAD WITHOUT CONTRAST  CT CERVICAL SPINE WITHOUT CONTRAST  TECHNIQUE: Multidetector CT imaging of the head and cervical spine was performed following the standard protocol without intravenous contrast. Multiplanar CT image reconstructions of the cervical spine were also generated.  COMPARISON:  04/17/2010  FINDINGS: CT HEAD FINDINGS  There is no intracranial hemorrhage, mass or evidence of acute infarction. There is mild generalized atrophy. There is mild chronic microvascular ischemic change. There is no significant extra-axial fluid collection.  No acute intracranial findings are evident. Calvarium and skullbase are intact.  CT CERVICAL SPINE FINDINGS  The vertebral column, pedicles and facet articulations are intact. There is no evidence of acute fracture. No acute soft tissue abnormalities are evident.  Moderately severe degenerative disc changes are present, particularly from C4 through C7. There is a grade 1 degenerative appearing anterolisthesis at C4-5.  IMPRESSION: 1. Negative for acute intracranial traumatic injury. There is generalized atrophy and mild chronic microvascular ischemic disease. 2. Negative for acute cervical spine fracture   Electronically Signed   By: Andreas Newport M.D.   On: 10/03/2014 22:25   Ct Cervical Spine Wo Contrast  10/03/2014   CLINICAL DATA:  Witnessed fall with laceration to the left side of the head.  EXAM: CT HEAD WITHOUT CONTRAST  CT CERVICAL SPINE WITHOUT CONTRAST  TECHNIQUE: Multidetector CT imaging of the head and cervical spine was performed following the standard protocol without intravenous contrast. Multiplanar CT image reconstructions of the cervical spine were also generated.  COMPARISON:  04/17/2010  FINDINGS: CT HEAD FINDINGS  There is no  intracranial hemorrhage, mass or evidence of acute infarction. There is mild generalized atrophy. There is mild chronic microvascular ischemic change. There is no significant extra-axial fluid collection.  No acute intracranial findings are evident. Calvarium and skullbase are intact.  CT CERVICAL SPINE FINDINGS  The vertebral column, pedicles and facet articulations are intact. There is no evidence of acute fracture. No acute soft tissue abnormalities are evident.  Moderately severe degenerative disc changes are present, particularly from C4 through C7. There is a grade 1 degenerative appearing anterolisthesis at C4-5.  IMPRESSION: 1. Negative for acute intracranial traumatic injury. There is generalized atrophy and mild chronic microvascular ischemic disease. 2.  Negative for acute cervical spine fracture   Electronically Signed   By: Andreas Newport M.D.   On: 10/03/2014 22:25     EKG Interpretation   Date/Time:  Saturday October 03 2014 21:48:00 EDT Ventricular Rate:  94 PR Interval:  210 QRS Duration: 138 QT Interval:  394 QTC Calculation: 493 R Axis:   19 Text Interpretation:  Sinus rhythm Borderline prolonged PR interval Left  bundle branch block ED PHYSICIAN INTERPRETATION AVAILABLE IN [facility] Confirmed by TEST, Record ([id number]) on 10/04/2014 8:20:15 AM      MDM   Final diagnoses:  Fall from standing, initial encounter  Traumatic hematoma of head, initial encounter    Patient is 17y F PMH dementia presents s/p fall from standing while getting up from bed at nursing facility. Syncope seems unlikely, more likely mechanical as her bed wheels were not locked leading to fall. Wound is hemostatic. No C spine tenderness. Head/C spine CT obtained and do not show any acute findings. Wound repaired with staples which patient tolerated well. Instructed pt's family to have these removed in 3-5 days. Also included these instructions in discharge paperwork. EKG was obtained and is noted as  above. Patient discharged to nursing home in good condition under care of son and daughter.    Theodosia Quay, MD 10/04/14 2207  Sherwood Gambler, MD 10/05/14 [phone]

## 2014-10-03 NOTE — ED Notes (Signed)
Pt arrives from [facility] where pt is resident of memory care unit.  EMS reports staff reported pt tripped while staff was changing pt's clothes.  Head lac noted, bleeding controlled at this time, gauze bandage in place.  Pt denies neck or back pain, denies dizziness, lightheadedness, N/V, CP.  NAD noted at this time.  Resp e/u.

## 2014-10-03 NOTE — Discharge Instructions (Signed)
Patient can resume all previous medications. No changes were made during this visit. Theodosia Quay, MD

## 2014-10-04 DIAGNOSIS — S0101XA Laceration without foreign body of scalp, initial encounter: Secondary | ICD-10-CM | POA: Diagnosis not present

## 2014-10-04 NOTE — ED Provider Notes (Signed)
I saw and evaluated the patient, reviewed the resident's note and I agree with the findings and plan.   EKG Interpretation   Date/Time:  Saturday October 03 2014 21:48:00 EDT Ventricular Rate:  94 PR Interval:  210 QRS Duration: 138 QT Interval:  394 QTC Calculation: 493 R Axis:   19 Text Interpretation:  Sinus rhythm Borderline prolonged PR interval Left  bundle branch block ED PHYSICIAN INTERPRETATION AVAILABLE IN [facility] Confirmed by TEST, Record ([id number]) on 10/04/2014 8:20:15 AM       Patient with a fall that most likely was a trip based on nursing home information and the patient's roommate. Patient is at her normal mental baseline with a small scalp laceration. Plan to repair and follow-up with PCP. I provided general supervision of resident during laceration repair.  Sherwood Gambler, MD 10/04/14 [phone]

## 2014-11-25 ENCOUNTER — Encounter: Payer: Self-pay | Admitting: Nurse Practitioner

## 2014-11-25 ENCOUNTER — Ambulatory Visit (INDEPENDENT_AMBULATORY_CARE_PROVIDER_SITE_OTHER): Payer: Medicare Other | Admitting: Nurse Practitioner

## 2014-11-25 ENCOUNTER — Telehealth: Payer: Self-pay

## 2014-11-25 VITALS — BP 105/71 | HR 103 | Wt 141.0 lb

## 2014-11-25 DIAGNOSIS — F068 Other specified mental disorders due to known physiological condition: Secondary | ICD-10-CM | POA: Diagnosis not present

## 2014-11-25 DIAGNOSIS — F0390 Unspecified dementia without behavioral disturbance: Secondary | ICD-10-CM

## 2014-11-25 DIAGNOSIS — R269 Unspecified abnormalities of gait and mobility: Secondary | ICD-10-CM

## 2014-11-25 NOTE — Progress Notes (Signed)
I agree with the assessment and plan as directed by NP .The patient is known to me .   DOHMEIER,CARMEN, MD  

## 2014-11-25 NOTE — Telephone Encounter (Signed)
Dr. Brett Fairy asked me to move the pt's appt to Carolyn's schedule today because Hoyle Sauer, NP has seen the pt before. Lovey Newcomer, RN is aware. Pt's son, Merrily Pew, is ok with his mother seeing the NP. I called and left a message at pt's daughter's cell phone VM to let her know of the change in provider, but no answer, so I left a message asking her to call back.

## 2014-11-25 NOTE — Patient Instructions (Signed)
Per nursing home sheet 

## 2014-11-25 NOTE — Progress Notes (Signed)
[facility]  PATIENT: Becky [name] DOB: 1929/08/22   REASON FOR VISIT: Dementia, postpolio syndrome, gait abnormality HISTORY FROM: Daughter and patient    HISTORY OF PRESENT ILLNESS:: [name], 81 year old female returns for follow-up. She has a history of memory loss The family has noted that since Becky [name] had become progressively more forgetful.  Aricept in its generic form was 2 years prior and the Namenda added. Since last seen in March by Dr. Brett Fairy the patient has moved  to an assisted living facility. The daughter Becky [name] is her power of attorney. They both feel that she has made the transition with minimal difficulty. She has made friends. She goes to a craft class The patient feels that her cognitive abilities are unchanged.  The decision to place her an assisted living was because she was not taking her medications as prescribed. Recent fall 10/03/2014, CT of the head without acute change. She returns for reevaluation   Interval history from 3-20/2016. Becky [name] is here today for a follow-up visit 6 months ago she scored 26 out of 30 points in a Mini-Mental Status Examination today she scored 25 out of 30 points. In addition a [place] cognitive assessment was performed and here she scored only 20 out of 30 points. Her difficulties were with a clock drawing test trail making was difficult and she was unable to copy a cube. She is reportedly a little bit more sleepy in daytime also she states that her nocturnal sleep is sound and refreshing to her. I asked Becky [name] depending on her decreased score in her cognitive testing today would be the person that will make financial decisions for her or legal affairs to be delegated to and she appointed her daughter. By Becky [name] doesn't feel that she is excessively daytime sleepy her family wonders if she sleeps often until the noon time if she wouldn't be woken up. She would also on some  day school without getting up for meals or fluid intake. She is more alert in the afternoon and evening and usually has a better appetite than as well. Her regular bedtime is between 11 PM and midnight.  There is a discrepancy between her daughters observation and the patients perception; she insisted she cooks for herself, her daughter shook her head , stating no. Daughter reports her mother sleeps often to afternoon, that's when the medication are taken. The bed time has been extended from 11 Pm to 12 .00 noon.  She has not much daylight exposure . Her gait instability requires a family member to be with her.     REVIEW OF SYSTEMS: Full 14 system review of systems performed and notable only for those listed, all others are neg:  Constitutional: neg  Cardiovascular: neg Ear/Nose/Throat: neg  Skin: neg Eyes: neg Respiratory: neg Gastroitestinal: neg  Hematology/Lymphatic: neg  Endocrine: neg Musculoskeletal: Recent fall, CT negative Allergy/Immunology: neg Neurological: neg Psychiatric: neg Sleep : neg   ALLERGIES: No Known Allergies  HOME MEDICATIONS: Outpatient Prescriptions Prior to Visit  Medication Sig Dispense Refill  . acetaminophen (TYLENOL) 500 MG tablet Take 500 mg by mouth every 6 (six) hours as needed for moderate pain.     Marland Kitchen albuterol (PROVENTIL HFA;VENTOLIN HFA) 108 (90 BASE) MCG/ACT inhaler Inhale 2 puffs into the lungs every 6 (six) hours as needed for wheezing or shortness of breath. 1 Inhaler 0  . brimonidine (ALPHAGAN) 0.15 % ophthalmic solution Place 1 drop into the right eye 2 (  two) times daily.    Marland Kitchen buPROPion (WELLBUTRIN XL) 150 MG 24 hr tablet Take 150 mg by mouth daily.    . Cholecalciferol (VITAMIN D3) 5000 UNITS CAPS Take 10,000 Units by mouth every Sunday.     . clonazePAM (KLONOPIN) 0.5 MG tablet Take 0.5 mg by mouth 2 (two) times daily.    Marland Kitchen donepezil (ARICEPT) 10 MG tablet TAKE 1 TABLET (10 MG TOTAL) BY MOUTH DAILY. 90 tablet 1  . fluconazole  (DIFLUCAN) 150 MG tablet Take 1 tablet (150 mg total) by mouth once. 1 tablet 0  . ibandronate (BONIVA) 150 MG tablet Take 1 tablet (150 mg total) by mouth every 30 (thirty) days. Take in the morning with a full glass of water, on an empty stomach, and do not take anything else by mouth or lie down for the next 30 min. 1 tablet 5  . ipratropium (ATROVENT) 0.06 % nasal spray Place 2 sprays into both nostrils daily as needed for rhinitis.     . Memantine HCl ER (NAMENDA XR) 28 MG CP24 Take 28 mg by mouth daily. 30 capsule 6  . Multiple Vitamin (MULTIVITAMIN WITH MINERALS) TABS tablet Take 1 tablet by mouth daily.    . naproxen sodium (ANAPROX) 220 MG tablet Take 220 mg by mouth 2 (two) times daily as needed (FOR PAIN).     Vladimir Faster Glycol-Propyl Glycol (SYSTANE) 0.4-0.3 % GEL Place 1 application into the left eye as needed (FOR FUCH'S).    . sertraline (ZOLOFT) 100 MG tablet Take 150 mg by mouth daily.     . simvastatin (ZOCOR) 10 MG tablet Take 1 tablet (10 mg total) by mouth daily at 6 PM. 90 tablet 1  . sodium chloride (MURO 128) 5 % ophthalmic ointment Place 1 application into the left eye 5 (five) times daily.     . travoprost, benzalkonium, (TRAVATAN) 0.004 % ophthalmic solution Place 1 drop into the right eye at bedtime.      . cyanocobalamin (,VITAMIN B-12,) 1000 MCG/ML injection Inject 1 mL (1,000 mcg total) into the muscle every 30 (thirty) days. (Patient not taking: Reported on 11/25/2014) 10 mL 1  . LORazepam (ATIVAN) 0.5 MG tablet Take 1 tablet (0.5 mg total) by mouth 2 (two) times daily as needed for anxiety. (Patient not taking: Reported on 11/25/2014) 30 tablet 0  . nystatin cream (MYCOSTATIN) Apply 1 application topically 2 (two) times daily. (Patient not taking: Reported on 11/25/2014) 30 g 0   No facility-administered medications prior to visit.    PAST MEDICAL HISTORY: Past Medical History  Diagnosis Date  . Polio     PPS  (age 30 yr)  . Arthritis   . Fibromyalgia   .  Depression   . Hyperlipidemia   . Ulcer     small- stomach  . Migraines   . Fuchs' corneal dystrophy     L eye  . Glaucoma     R eye    PAST SURGICAL HISTORY: Past Surgical History  Procedure Laterality Date  . Inguinal herniorrhaphy    . Tubal ligation      FAMILY HISTORY: Family History  Problem Relation Age of Onset  . Alcohol abuse      addiction   . Arthritis    . Colon cancer      1st degree relative <60  . Diabetes      1st degree relative  . Psychosis    . Arthritis Brother   . Cancer Brother  SOCIAL HISTORY: Social History   Social History  . Marital Status: Divorced    Spouse Name: N/A  . Number of Children: 3  . Years of Education: Nursing   Occupational History  . Retired    Social History Main Topics  . Smoking status: Never Smoker   . Smokeless tobacco: Never Used  . Alcohol Use: No  . Drug Use: No  . Sexual Activity: No   Other Topics Concern  . Not on file   Social History Narrative   Patient lives at [facility].  Daughter is POA.   Patient has 3 children.    Patient has a nursing degree.    Patient is retired.    Patient is right handed.   Patient is divorced.    Caffeine  1 cup daily avg. (almost)     PHYSICAL EXAM  Filed Vitals:   11/25/14 1501  BP: 105/71  Pulse: 103  Weight: 141 lb (63.957 kg)   Body mass index is 23.46 kg/(m^2).  Generalized: Well developed, in no acute distress  Head: normocephalic and atraumatic,. Oropharynx benign  Neck: Supple, no carotid bruits  Cardiac: Regular rate rhythm, no murmur  Musculoskeletal: No deformity   Neurological examination   Mentation: Alert oriented to time, place, history taking. Attention span and concentration appropriate. MMSE 26/30. AFT 9Clock drawing 2/4. Last MMSE 26/30.   Follows all commands speech and language fluent.   Cranial nerve II-XII: Pupils were equal round reactive to light extraocular movements were full, visual field were full on  confrontational test. Facial sensation and strength were normal. hearing was intact to finger rubbing bilaterally. Uvula tongue midline. head turning and shoulder shrug were normal and symmetric.Tongue protrusion into cheek strength was normal. Motor: normal bulk and tone, full strength in the BUE, BLE, on the left, mild weakness right lower extremity (polio 1944) Coordination: finger-nose-finger, heel-to-shin bilaterally, no dysmetria Reflexes: Brachioradialis 2/2, biceps 2/2, triceps 2/2, patellar 2/2, Achilles 2/2, plantar responses were flexor bilaterally. Gait and Station: Rising up from seated position without assistance, wide based  stance,  moderate stride, ambulates with a rolling walker  DIAGNOSTIC DATA (LABS, IMAGING, TESTING) - I reviewed patient records, labs, notes, testing and imaging myself where available.  Lab Results  Component Value Date   WBC 9.3 10/03/2014   HGB 13.8 10/03/2014   HCT 41.4 10/03/2014   MCV 93.7 10/03/2014   PLT 201 10/03/2014      Component Value Date/Time   NA 134* 10/03/2014 2145   K 3.6 10/03/2014 2145   CL 101 10/03/2014 2145   CO2 23 10/03/2014 2145   GLUCOSE 102* 10/03/2014 2145   BUN 14 10/03/2014 2145   CREATININE 0.73 10/03/2014 2145   CALCIUM 9.0 10/03/2014 2145   PROT 6.8 04/23/2014 1216   ALBUMIN 4.0 04/23/2014 1216   AST 19 04/23/2014 1216   ALT 15 04/23/2014 1216   ALKPHOS 52 04/23/2014 1216   BILITOT 0.9 04/23/2014 1216   GFRNONAA >60 10/03/2014 2145   GFRAA >60 10/03/2014 2145   Lab Results  Component Value Date   CHOL 234* 04/23/2014   HDL 80.20 04/23/2014   LDLCALC 125* 04/23/2014   LDLDIRECT 119.6 10/24/2011   TRIG 142.0 04/23/2014   CHOLHDL 3 04/23/2014    Lab Results  Component Value Date   VITAMINB12 580 06/10/2014   Lab Results  Component Value Date   TSH 1.07 04/23/2014      ASSESSMENT AND PLAN  81 y.o. year old female  has  a past medical history of Polio; Arthritis; Fibromyalgia; Depression;  Hyperlipidemia; Ulcer; Migraines; Fuchs' corneal dystrophy; and Glaucoma. and progressive memory loss here to follow-up . She had a recent fall with a negative CT of the head She is now living in an assisted living facility since last seen .  Continue Aricept 10 mg daily Continue Namenda 28 mg daily Try to stay involved with activities at facility Use rolling walker at all times at risk for falls Follow-up in 6 monthsVst time 25 min Becky [name], [facility], [facility], [facility] [address], [place], [place] [address] [phone]

## 2014-12-30 DIAGNOSIS — H1851 Endothelial corneal dystrophy: Secondary | ICD-10-CM | POA: Diagnosis not present

## 2014-12-30 DIAGNOSIS — Z961 Presence of intraocular lens: Secondary | ICD-10-CM | POA: Diagnosis not present

## 2014-12-30 DIAGNOSIS — H40013 Open angle with borderline findings, low risk, bilateral: Secondary | ICD-10-CM | POA: Diagnosis not present

## 2015-02-09 DIAGNOSIS — Z961 Presence of intraocular lens: Secondary | ICD-10-CM | POA: Diagnosis not present

## 2015-02-09 DIAGNOSIS — H53431 Sector or arcuate defects, right eye: Secondary | ICD-10-CM | POA: Diagnosis not present

## 2015-02-09 DIAGNOSIS — H3530 Unspecified macular degeneration: Secondary | ICD-10-CM | POA: Diagnosis not present

## 2015-02-09 DIAGNOSIS — Z9842 Cataract extraction status, left eye: Secondary | ICD-10-CM | POA: Diagnosis not present

## 2015-02-09 DIAGNOSIS — H47391 Other disorders of optic disc, right eye: Secondary | ICD-10-CM | POA: Diagnosis not present

## 2015-02-09 DIAGNOSIS — H401130 Primary open-angle glaucoma, bilateral, stage unspecified: Secondary | ICD-10-CM | POA: Diagnosis not present

## 2015-02-09 DIAGNOSIS — H3589 Other specified retinal disorders: Secondary | ICD-10-CM | POA: Diagnosis not present

## 2015-02-09 DIAGNOSIS — Z9841 Cataract extraction status, right eye: Secondary | ICD-10-CM | POA: Diagnosis not present

## 2015-05-03 ENCOUNTER — Ambulatory Visit (INDEPENDENT_AMBULATORY_CARE_PROVIDER_SITE_OTHER): Payer: Medicare Other | Admitting: Family Medicine

## 2015-05-03 ENCOUNTER — Encounter: Payer: Self-pay | Admitting: Family Medicine

## 2015-05-03 VITALS — BP 92/62 | HR 90 | Temp 98.3°F | Ht 65.0 in | Wt 139.3 lb

## 2015-05-03 DIAGNOSIS — F3342 Major depressive disorder, recurrent, in full remission: Secondary | ICD-10-CM

## 2015-05-03 DIAGNOSIS — M810 Age-related osteoporosis without current pathological fracture: Secondary | ICD-10-CM | POA: Diagnosis not present

## 2015-05-03 DIAGNOSIS — H409 Unspecified glaucoma: Secondary | ICD-10-CM | POA: Diagnosis not present

## 2015-05-03 DIAGNOSIS — Z7689 Persons encountering health services in other specified circumstances: Secondary | ICD-10-CM

## 2015-05-03 DIAGNOSIS — F329 Major depressive disorder, single episode, unspecified: Secondary | ICD-10-CM | POA: Insufficient documentation

## 2015-05-03 DIAGNOSIS — E785 Hyperlipidemia, unspecified: Secondary | ICD-10-CM | POA: Diagnosis not present

## 2015-05-03 DIAGNOSIS — F0390 Unspecified dementia without behavioral disturbance: Secondary | ICD-10-CM

## 2015-05-03 DIAGNOSIS — H1851 Endothelial corneal dystrophy: Secondary | ICD-10-CM

## 2015-05-03 DIAGNOSIS — H18519 Endothelial corneal dystrophy, unspecified eye: Secondary | ICD-10-CM | POA: Insufficient documentation

## 2015-05-03 DIAGNOSIS — F068 Other specified mental disorders due to known physiological condition: Secondary | ICD-10-CM

## 2015-05-03 DIAGNOSIS — Z7189 Other specified counseling: Secondary | ICD-10-CM

## 2015-05-03 NOTE — Progress Notes (Signed)
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 She now lives at [facility] assisted living facility. She reports she gets ome regular activity, regular meals and feels her mood has been good. No recent falls. Uses her walker always.   Hyperlipidemia: -on simvastatin -stable  Osteoporosis: -on Boniva, reports has been on this for 10 years -takes Vit D3 also -fall last year, has been using walker and feels steady on her feet, no further falls since -on ROC last DEXA < 2 years ago with osteoporosis  Dementia: -managed by her neurologist, Dr. Brett Fairy -medications: namenda, aricept -son reports stable -he needs letter stating she is medically unable to make her decisions  Depression/Anxiety: -Sees Dr. Lita Mains in psychiatry -on sertraline and clonazepam -she reports her mood is good -denies: depressed mood or anxiety  Glaucoma/Fuchs corneal atrophy: -sees Dr. [name] -visual acuity reported as being good   ROS: See pertinent positives and negatives per HPI.  Past Medical History  Diagnosis Date  . Polio     PPS  (age 34 yr)  . Arthritis   . Fibromyalgia   . Depression     and anxiety  . Hyperlipidemia   . Ulcer     small- stomach  . Migraines   . Fuchs' corneal dystrophy     L eye  . Glaucoma     R eye  . Osteoporosis   . Dementia     Past Surgical History  Procedure Laterality Date  . Inguinal herniorrhaphy    . Tubal ligation      Family History  Problem Relation Age of Onset  . Alcohol abuse      addiction   . Arthritis    . Colon cancer      1st degree relative <60  . Diabetes      1st degree relative  . Psychosis    . Arthritis Brother   . Cancer Brother     Social History   Social History  . Marital Status: Divorced    Spouse Name: N/A  . Number of Children: 3  . Years of Education: Nursing    Occupational History  . Retired    Social History Main Topics  . Smoking status: Never Smoker   . Smokeless tobacco: Never Used  . Alcohol Use: No  . Drug Use: No  . Sexual Activity: No   Other Topics Concern  . None   Social History Narrative   Patient lives at [facility].  Daughter is POA.   Patient has 3 children.    Patient has a nursing degree.    Patient is retired.    Patient is right handed.   Patient is divorced.    Caffeine  1 cup daily avg. (almost)     Current outpatient prescriptions:  .  Acetaminophen (TYLENOL EXTRA STRENGTH PO), Take by mouth as needed., Disp: , Rfl:  .  brimonidine (ALPHAGAN) 0.15 % ophthalmic solution, Place 1 drop into the right eye 2 (two) times daily., Disp: , Rfl:  .  Cholecalciferol (VITAMIN D3) 5000 UNITS CAPS, Take 10,000 Units by mouth every Sunday. , Disp: , Rfl:  .  clonazePAM (KLONOPIN) 0.5 MG tablet, Take 0.5 tablets (0.25 mg total) by mouth 2 (two) times daily., Disp: , Rfl:  .  donepezil (ARICEPT) 10 MG tablet, TAKE 1 TABLET (10 MG TOTAL) BY MOUTH DAILY.,  Disp: 90 tablet, Rfl: 1 .  ibandronate (BONIVA) 150 MG tablet, Take 1 tablet (150 mg total) by mouth every 30 (thirty) days. Take in the morning with a full glass of water, on an empty stomach, and do not take anything else by mouth or lie down for the next 30 min., Disp: 1 tablet, Rfl: 5 .  Memantine HCl ER (NAMENDA XR) 28 MG CP24, Take 28 mg by mouth daily., Disp: 30 capsule, Rfl: 6 .  Multiple Vitamin (MULTIVITAMIN WITH MINERALS) TABS tablet, Take 1 tablet by mouth daily., Disp: , Rfl:  .  sertraline (ZOLOFT) 100 MG tablet, Take 150 mg by mouth daily. , Disp: , Rfl:  .  simvastatin (ZOCOR) 10 MG tablet, Take 1 tablet (10 mg total) by mouth daily at 6 PM., Disp: 90 tablet, Rfl: 1 .  sodium chloride (MURO 128) 5 % ophthalmic ointment, Place 1 application into the left eye 5 (five) times daily. , Disp: , Rfl:  .  Sodium Chloride, Hypertonic, (ALTACHLORE OP), Apply to  eye., Disp: , Rfl:  .  travoprost, benzalkonium, (TRAVATAN) 0.004 % ophthalmic solution, Place 1 drop into the right eye at bedtime.  , Disp: , Rfl:   EXAM:  Filed Vitals:   05/03/15 1119  BP: 92/62  Pulse: 90  Temp: 98.3 F (36.8 C)    Body mass index is 23.18 kg/(m^2).  GENERAL: vitals reviewed and listed above, alert, oriented, appears well hydrated and in no acute distress  HEENT: atraumatic, conjunttiva clear, no obvious abnormalities on inspection of external nose and ears  NECK: no obvious masses on inspection  LUNGS: clear to auscultation bilaterally, no wheezes, rales or rhonchi, good air movement  CV: HRRR, no peripheral edema  MS: moves all extremities without noticeable abnormality  PSYCH: pleasant and cooperative, no obvious depression or anxiety  ASSESSMENT AND PLAN:  Discussed the following assessment and plan:  Hyperlipemia - cont statin -labs at physical  Osteoporosis -continue boniva -fall precautions -DEXA discussion at wellness visit  Dementia arising in the senium and presenium -managed by her neurologist, advised any recommnedations regarding her capacity to make medical decisions come from her treating neurologist or psychiatry  Recurrent major depressive disorder, in full remission [facility]) -sees pscyhiatry  Glaucoma Fuchs' corneal dystrophy -sees optho  Encounter to establish care with new doctor -current medicaiton list from ALF reviewed and EPIC meds updated -problem list, PMH, PSH, [place] social hx reviewed and updated  -Patient advised to return or notify a doctor immediately if symptoms worsen or persist or new concerns arise.  Patient Instructions  BEFORE YOU LEAVE: -schedule Medicare Annual Wellness visit in 3 months; come fasting for labs - may drink water and take medications  Use walker at all time  Exercise classes may be a good addition - chair exercises  We recommend the following healthy lifestyle measures: - eat a  healthy whole foods diet consisting of regular small meals composed of vegetables, fruits, beans, nuts, seeds, healthy meats such as white chicken and fish and whole grains.  - avoid sweets, white starchy foods, fried foods, fast food, processed foods, sodas, red meet and other fattening foods.  - get a least 150-300 minutes of aerobic exercise per week.       Colin Benton R.

## 2015-05-03 NOTE — Progress Notes (Signed)
Pre visit review using our clinic review tool, if applicable. No additional management support is needed unless otherwise documented below in the visit note. 

## 2015-05-03 NOTE — Patient Instructions (Signed)
BEFORE YOU LEAVE: -schedule Medicare Annual Wellness visit in 3 months; come fasting for labs - may drink water and take medications  Use walker at all time  Exercise classes may be a good addition - chair exercises  We recommend the following healthy lifestyle measures: - eat a healthy whole foods diet consisting of regular small meals composed of vegetables, fruits, beans, nuts, seeds, healthy meats such as white chicken and fish and whole grains.  - avoid sweets, white starchy foods, fried foods, fast food, processed foods, sodas, red meet and other fattening foods.  - get a least 150-300 minutes of aerobic exercise per week.

## 2015-05-04 ENCOUNTER — Telehealth: Payer: Self-pay | Admitting: Nurse Practitioner

## 2015-05-04 NOTE — Telephone Encounter (Signed)
I can do the letter there is a fee and I have 14 days to complete per our agency policy

## 2015-05-04 NOTE — Telephone Encounter (Signed)
Pt's son, Jenny Reichmann called to ask for a letter indicating his mothers cognitive abilities. He states that [facility] is requesting information so he may take over her finances. I have asked that he bring POA to the office for [place] to put on her file. He has agreed and will come by tomorrow. I told the pt that the letter may not be available when he come. He is on pts DPR. May call son at [phone]

## 2015-05-05 NOTE — Telephone Encounter (Signed)
Rn call patients son Becky [name] about needing letter for social security. Rn explain the letter can be written and there is a 25.00 charge to have it done. Also Rn explain we need a copy of the HCPOA forms verifying he is on it. Rn explain we have 10 days to write the letter per Hoyle Sauer. Pt's son stated his mom has dementia, and she cannot handle her finances. Pts son would like the letter to discuss his moms diagnose of dementia. Pts son stated he will bring a copy in this week.

## 2015-05-06 DIAGNOSIS — Z0289 Encounter for other administrative examinations: Secondary | ICD-10-CM

## 2015-05-06 NOTE — Telephone Encounter (Signed)
Rn receive a new HCPOA, it was scan by [name] front desk. Pts son Becky [name] is on the form. Information was given to Hoyle Sauer to write letter to social security concerning his moms cognitive abilities. Pts son understands we have up till 10 days to write letter.

## 2015-05-06 NOTE — Telephone Encounter (Signed)
Rn call patients son on 05/05/2015 to explain that the HCPOA does not have his name on. Pts son will fax or send another copy in.

## 2015-05-07 DIAGNOSIS — R32 Unspecified urinary incontinence: Secondary | ICD-10-CM | POA: Diagnosis not present

## 2015-05-10 ENCOUNTER — Encounter: Payer: Self-pay | Admitting: Nurse Practitioner

## 2015-05-11 NOTE — Telephone Encounter (Signed)
Letter done 05-10-15 per noted in EPIC.

## 2015-05-12 ENCOUNTER — Telehealth: Payer: Self-pay | Admitting: *Deleted

## 2015-05-12 NOTE — Telephone Encounter (Signed)
Patient letter faxed to [phone] on 05/11/15.

## 2015-05-13 ENCOUNTER — Telehealth: Payer: Self-pay | Admitting: *Deleted

## 2015-05-13 NOTE — Telephone Encounter (Signed)
[facility] [id number])  faxed an order to be completed for PT.  Per Dr Maudie Mercury I called [facility] and informed Joy she stated she cannot sign the orders as she has not seen the patient for the diagnoses that are listed as she has only seen her for dementia and osteoporosis .  Joy stated she sent the order to be completed due to the patient having a fall over the weekend and her insurance will cover PT to help with this and asked if she could send another order to be completed for this reason.  I called Joy back and advised her per Dr Maudie Mercury she cannot complete the second set of paperwork due to not having seen the pt since she fell and she needs an appt and Joy stated she will contact the pts family with this information.

## 2015-05-25 ENCOUNTER — Ambulatory Visit: Payer: Medicare Other | Admitting: Nurse Practitioner

## 2015-05-31 ENCOUNTER — Ambulatory Visit: Payer: Medicare Other | Admitting: Nurse Practitioner

## 2015-06-02 DIAGNOSIS — H1851 Endothelial corneal dystrophy: Secondary | ICD-10-CM | POA: Diagnosis not present

## 2015-06-02 DIAGNOSIS — H53453 Other localized visual field defect, bilateral: Secondary | ICD-10-CM | POA: Diagnosis not present

## 2015-06-02 DIAGNOSIS — Z961 Presence of intraocular lens: Secondary | ICD-10-CM | POA: Diagnosis not present

## 2015-06-07 DIAGNOSIS — R32 Unspecified urinary incontinence: Secondary | ICD-10-CM | POA: Diagnosis not present

## 2015-06-14 ENCOUNTER — Ambulatory Visit (INDEPENDENT_AMBULATORY_CARE_PROVIDER_SITE_OTHER): Payer: Medicare Other | Admitting: Nurse Practitioner

## 2015-06-14 ENCOUNTER — Encounter: Payer: Self-pay | Admitting: Nurse Practitioner

## 2015-06-14 VITALS — BP 108/65 | HR 111 | Ht 65.0 in | Wt 139.2 lb

## 2015-06-14 DIAGNOSIS — F068 Other specified mental disorders due to known physiological condition: Secondary | ICD-10-CM | POA: Diagnosis not present

## 2015-06-14 DIAGNOSIS — F0390 Unspecified dementia without behavioral disturbance: Secondary | ICD-10-CM

## 2015-06-14 NOTE — Patient Instructions (Signed)
Per skilled nursing sheet 

## 2015-06-14 NOTE — Progress Notes (Signed)
[facility]  PATIENT: Becky [name] DOB: [birth year]   REASON FOR VISIT: Follow-up for dementia abnormality of gait, history of depression HISTORY FROM: Patient and daughter    HISTORY OF PRESENT ILLNESS:Becky [name], 82year-old female returns for follow-up. She has a history of memory loss The family has noted that since Christmastime 2013 Becky [name] had become progressively more forgetful.  Aricept in its generic form was 2 years prior and the Namenda added. The  patient has moved to an assisted living facility about one year ago. The daughter Becky [name] is her power of attorney. They both feel that she has made the transition with minimal difficulty. She has made friends. She goes to a craft class The patient feels that her cognitive abilities are unchanged. She continues to read a lot.  The decision to place her an assisted living was because she was not taking her medications as prescribed. She returns for reevaluation   Interval history from 3-20/2016. Becky [name] is here today for a follow-up visit 6 months ago she scored 26 out of 30 points in a Mini-Mental Status Examination today she scored 25 out of 30 points. In addition a Montral cognitive assessment was performed and here she scored only 20 out of 30 points. Her difficulties were with a clock drawing test trail making was difficult and she was unable to copy a cube. She is reportedly a little bit more sleepy in daytime also she states that her nocturnal sleep is sound and refreshing to her. I asked Becky [name] depending on her decreased score in her cognitive testing today would be the person that will make financial decisions for her or legal affairs to be delegated to and she appointed her daughter. By Becky [name] doesn't feel that she is excessively daytime sleepy her family wonders if she sleeps often until the noon time if she wouldn't be woken up. She would also on some day school without getting up for  meals or fluid intake. She is more alert in the afternoon and evening and usually has a better appetite than as well. Her regular bedtime is between 11 PM and midnight.  There is a discrepancy between her daughters observation and the patients perception; she insisted she cooks for herself, her daughter shook her head , stating no. Daughter reports her mother sleeps often to afternoon, that's when the medication are taken. The bed time has been extended from 11 Pm to 12 .00 noon.  She has not much daylight exposure . Her gait instability requires a family member to be with her.     REVIEW OF SYSTEMS: Full 14 system review of systems performed and notable only for those listed, all others are neg:  Constitutional: neg  Cardiovascular: neg Ear/Nose/Throat: neg  Skin: neg Eyes: neg Respiratory: neg Gastroitestinal: neg  Hematology/Lymphatic: neg  Endocrine: neg Musculoskeletal: Gait abnormality Allergy/Immunology: neg Neurological: Memory loss Psychiatric: neg Sleep : neg   ALLERGIES: No Known Allergies  HOME MEDICATIONS: Outpatient Prescriptions Prior to Visit  Medication Sig Dispense Refill  . Acetaminophen (TYLENOL EXTRA STRENGTH PO) Take by mouth as needed.    . brimonidine (ALPHAGAN) 0.15 % ophthalmic solution Place 1 drop into the right eye 2 (two) times daily.    . Cholecalciferol (VITAMIN D3) 5000 UNITS CAPS Take 10,000 Units by mouth every Sunday.     . clonazePAM (KLONOPIN) 0.5 MG tablet Take 0.5 tablets (0.25 mg total) by mouth 2 (two) times daily.    Marland Kitchen donepezil (ARICEPT) 10  MG tablet TAKE 1 TABLET (10 MG TOTAL) BY MOUTH DAILY. 90 tablet 1  . ibandronate (BONIVA) 150 MG tablet Take 1 tablet (150 mg total) by mouth every 30 (thirty) days. Take in the morning with a full glass of water, on an empty stomach, and do not take anything else by mouth or lie down for the next 30 min. 1 tablet 5  . Memantine HCl ER (NAMENDA XR) 28 MG CP24 Take 28 mg by mouth daily. 30  capsule 6  . Multiple Vitamin (MULTIVITAMIN WITH MINERALS) TABS tablet Take 1 tablet by mouth daily.    . sertraline (ZOLOFT) 100 MG tablet Take 150 mg by mouth daily.     . simvastatin (ZOCOR) 10 MG tablet Take 1 tablet (10 mg total) by mouth daily at 6 PM. 90 tablet 1  . Sodium Chloride, Hypertonic, (ALTACHLORE OP) Apply to eye.    . travoprost, benzalkonium, (TRAVATAN) 0.004 % ophthalmic solution Place 1 drop into the right eye at bedtime.      . sodium chloride (MURO 128) 5 % ophthalmic ointment Place 1 application into the left eye 5 (five) times daily.      No facility-administered medications prior to visit.    PAST MEDICAL HISTORY: Past Medical History  Diagnosis Date  . Polio     PPS  (age 46 yr)  . Arthritis   . Fibromyalgia   . Depression     and anxiety  . Hyperlipidemia   . Ulcer     small- stomach  . Migraines   . Fuchs' corneal dystrophy     L eye  . Glaucoma     R eye  . Osteoporosis   . Dementia     PAST SURGICAL HISTORY: Past Surgical History  Procedure Laterality Date  . Inguinal herniorrhaphy    . Tubal ligation      FAMILY HISTORY: Family History  Problem Relation Age of Onset  . Alcohol abuse      addiction   . Arthritis    . Colon cancer      1st degree relative <60  . Diabetes      1st degree relative  . Psychosis    . Arthritis Brother   . Cancer Brother     SOCIAL HISTORY: Social History   Social History  . Marital Status: Divorced    Spouse Name: N/A  . Number of Children: 3  . Years of Education: Nursing   Occupational History  . Retired    Social History Main Topics  . Smoking status: Never Smoker   . Smokeless tobacco: Never Used  . Alcohol Use: No  . Drug Use: No  . Sexual Activity: No   Other Topics Concern  . Not on file   Social History Narrative   Patient lives at [facility].  Son is now [employer] [facility].   Patient has 3 children.    Patient has a nursing degree.    Patient is retired.    Patient is  right handed.   Patient is divorced.    Caffeine  1 cup daily avg. (almost)     PHYSICAL EXAM  Filed Vitals:   06/14/15 1357  BP: 108/65  Pulse: 111  Height: 5\' 5"  (1.651 m)  Weight: 139 lb 3.2 oz (63.141 kg)   Body mass index is 23.16 kg/(m^2). Generalized: Well developed, in no acute distress , well-groomed Head: normocephalic and atraumatic,. Oropharynx benign  Neck: Supple, no carotid bruits  Cardiac: Regular rate rhythm,  no murmur  Musculoskeletal: No deformity   Neurological examination   Mentation: Alert oriented to time, place, history taking. Attention span and concentration appropriate. MMSE 2230. AFT 7Clock drawing 2/4. Last MMSE 26/30. Follows all commands speech and language fluent.   Cranial nerve II-XII: Pupils were equal round reactive to light extraocular movements were full, visual field were full on confrontational test. Facial sensation and strength were normal. hearing was intact to finger rubbing bilaterally. Uvula tongue midline. head turning and shoulder shrug were normal and symmetric.Tongue protrusion into cheek strength was normal. Motor: normal bulk and tone, full strength in the BUE, BLE, on the left, mild weakness right lower extremity (polio 1944) Coordination: finger-nose-finger, heel-to-shin bilaterally, no dysmetria Reflexes: Brachioradialis 2/2, biceps 2/2, triceps 2/2, patellar 2/2, Achilles 2/2, plantar responses were flexor bilaterally. Gait and Station: Rising up from seated position without assistance, wide based stance, moderate stride, ambulates with a rolling walker   DIAGNOSTIC DATA (LABS, IMAGING, TESTING)    ASSESSMENT AND PLAN 82 y.o. year old female has a past medical history of Polio; Arthritis; Fibromyalgia; Depression; Hyperlipidemia; Ulcer; Migraines; Fuchs' corneal dystrophy; and Glaucoma. and progressive memory loss here to follow-up . She is now living in an assisted living facility   PLAN: Continue Aricept 10 mg  daily Continue Namenda 28 mg daily Try to stay involved with activities at facility Use rolling walker at all times at risk for falls Questions answered for daughter Follow-up in 6 months, next with Dr. Brett Fairy.Vst time 25 min Dennie Bible, [facility], [facility], APRN  [facility] [address], [place], [place] [address] [phone]

## 2015-06-14 NOTE — Progress Notes (Signed)
I agree with the assessment and plan as directed by NP .The patient is known to me .   Labib Cwynar, MD  

## 2015-06-15 ENCOUNTER — Telehealth: Payer: Self-pay | Admitting: Family Medicine

## 2015-06-15 NOTE — Telephone Encounter (Signed)
I left a message for Becky [name] to return my call.

## 2015-06-15 NOTE — Telephone Encounter (Signed)
Joy from [facility] [phone]) needs for you to call her in reference to Mrs. Meine papers that was sent to them unsigned.

## 2015-06-16 NOTE — Telephone Encounter (Signed)
I spoke with Joy and informed her per Dr Maudie Mercury she cannot sign the pink and green medication forms as some of the medications listed are not ones that she prescribes.  Dr Maudie Mercury asked that a new form be sent to her listing Bonova, vitamin D and Simvastatin.  I also advised Joy the FL2 and care plan forms were completed and will be faxed to her.  I also advised her Dr Maudie Mercury received a note from [facility] stating the pt needs a B12 level check, a doctor at [facility] was ordering an MRI and Caryl Asp stated she can do this at the nursing home if needed and orders are faxed to her [phone]) but she asked that I contact the pts son for more information.  I left a message for the pts son Merrily Pew to return my call.

## 2015-06-17 DIAGNOSIS — Z7689 Persons encountering health services in other specified circumstances: Secondary | ICD-10-CM

## 2015-06-20 ENCOUNTER — Encounter (HOSPITAL_COMMUNITY): Payer: Self-pay | Admitting: Emergency Medicine

## 2015-06-20 ENCOUNTER — Emergency Department (HOSPITAL_COMMUNITY)
Admission: EM | Admit: 2015-06-20 | Discharge: 2015-06-20 | Disposition: A | Discharge status: Home / Self Care | Payer: Medicare Other | Attending: Emergency Medicine | Admitting: Emergency Medicine

## 2015-06-20 DIAGNOSIS — S0990XA Unspecified injury of head, initial encounter: Secondary | ICD-10-CM | POA: Diagnosis not present

## 2015-06-20 DIAGNOSIS — F0390 Unspecified dementia without behavioral disturbance: Secondary | ICD-10-CM | POA: Diagnosis not present

## 2015-06-20 DIAGNOSIS — Y929 Unspecified place or not applicable: Secondary | ICD-10-CM | POA: Insufficient documentation

## 2015-06-20 DIAGNOSIS — E785 Hyperlipidemia, unspecified: Secondary | ICD-10-CM | POA: Diagnosis not present

## 2015-06-20 DIAGNOSIS — W208XXA Other cause of strike by thrown, projected or falling object, initial encounter: Secondary | ICD-10-CM | POA: Diagnosis not present

## 2015-06-20 DIAGNOSIS — Y939 Activity, unspecified: Secondary | ICD-10-CM | POA: Insufficient documentation

## 2015-06-20 DIAGNOSIS — Z79899 Other long term (current) drug therapy: Secondary | ICD-10-CM | POA: Diagnosis not present

## 2015-06-20 DIAGNOSIS — F329 Major depressive disorder, single episode, unspecified: Secondary | ICD-10-CM | POA: Insufficient documentation

## 2015-06-20 DIAGNOSIS — M1990 Unspecified osteoarthritis, unspecified site: Secondary | ICD-10-CM | POA: Diagnosis not present

## 2015-06-20 DIAGNOSIS — S0181XA Laceration without foreign body of other part of head, initial encounter: Secondary | ICD-10-CM | POA: Insufficient documentation

## 2015-06-20 DIAGNOSIS — S0180XA Unspecified open wound of other part of head, initial encounter: Secondary | ICD-10-CM | POA: Diagnosis not present

## 2015-06-20 DIAGNOSIS — M797 Fibromyalgia: Secondary | ICD-10-CM | POA: Diagnosis not present

## 2015-06-20 DIAGNOSIS — H409 Unspecified glaucoma: Secondary | ICD-10-CM | POA: Diagnosis not present

## 2015-06-20 DIAGNOSIS — S0191XA Laceration without foreign body of unspecified part of head, initial encounter: Secondary | ICD-10-CM

## 2015-06-20 DIAGNOSIS — S0101XA Laceration without foreign body of scalp, initial encounter: Secondary | ICD-10-CM | POA: Diagnosis not present

## 2015-06-20 DIAGNOSIS — Y999 Unspecified external cause status: Secondary | ICD-10-CM | POA: Diagnosis not present

## 2015-06-20 MED ORDER — BACITRACIN ZINC 500 UNIT/GM EX OINT
TOPICAL_OINTMENT | Freq: Once | CUTANEOUS | Status: AC
  Administered 2015-06-20: 1 via TOPICAL
  Filled 2015-06-20: qty 1.8

## 2015-06-20 MED ORDER — MUPIROCIN 2 % EX OINT: TOPICAL_OINTMENT | CUTANEOUS | Status: DC

## 2015-06-20 MED ORDER — LIDOCAINE HCL 2 % IJ SOLN
10.0000 mL | Freq: Once | INTRAMUSCULAR | Status: AC
  Administered 2015-06-20: 200 mg
  Filled 2015-06-20: qty 20

## 2015-06-20 NOTE — ED Provider Notes (Signed)
CSN: [id number]     Arrival date & time 06/20/15  1142 History   First MD Initiated Contact with Patient 06/20/15 1205     Chief Complaint  Patient presents with  . Head Laceration   (Consider location/radiation/quality/duration/timing/severity/associated sxs/prior Treatment) Patient is a 82 y.o. female presenting with scalp laceration. The history is provided by the patient, medical records and a relative. No language interpreter was used.  Head Laceration Pertinent negatives include no neck pain.  CLODAGH AIELLO is a 82 y.o. female  with a PMH of dementia, HLD, osteoporosis, migraines who presents to the Emergency Department from assisted living with daughter for laceration on her right anterior forehead near scalp line approximately one hour prior to arrival. Patient and daughter at bedside states that she has a picture propped up on ledge above her bed and picture fell and hit head causing laceration. Patient denies loss of consciousness, nausea, vomiting. She has no other complaints at this time. She is not on any anticoagulation.  Past Medical History  Diagnosis Date  . Polio     PPS  (age 8 yr)  . Arthritis   . Fibromyalgia   . Depression     and anxiety  . Hyperlipidemia   . Ulcer     small- stomach  . Migraines   . Fuchs' corneal dystrophy     L eye  . Glaucoma     R eye  . Osteoporosis   . Dementia    Past Surgical History  Procedure Laterality Date  . Inguinal herniorrhaphy    . Tubal ligation     Family History  Problem Relation Age of Onset  . Alcohol abuse      addiction   . Arthritis    . Colon cancer      1st degree relative <60  . Diabetes      1st degree relative  . Psychosis    . Arthritis Brother   . Cancer Brother    Social History  Substance Use Topics  . Smoking status: Never Smoker   . Smokeless tobacco: Never Used  . Alcohol Use: No   OB History    No data available     Review of Systems  Musculoskeletal: Negative for back pain  and neck pain.  Skin: Positive for wound.  Neurological: Negative for dizziness and syncope.  Hematological: Does not bruise/bleed easily.      Allergies  Review of patient's allergies indicates no known allergies.  Home Medications   Prior to Admission medications   Medication Sig Start Date End Date Taking? Authorizing Provider  Acetaminophen (TYLENOL EXTRA STRENGTH PO) Take by mouth as needed.    Historical Provider, MD  brimonidine (ALPHAGAN) 0.15 % ophthalmic solution Place 1 drop into the right eye 2 (two) times daily.    Historical Provider, MD  buPROPion (WELLBUTRIN XL) 150 MG 24 hr tablet Take 150 mg by mouth daily.     Historical Provider, MD  Cholecalciferol (VITAMIN D3) 5000 UNITS CAPS Take 10,000 Units by mouth every Sunday.     Historical Provider, MD  clonazePAM (KLONOPIN) 0.5 MG tablet Take 0.5 tablets (0.25 mg total) by mouth 2 (two) times daily. 05/03/15   Lucretia Kern, DO  donepezil (ARICEPT) 10 MG tablet TAKE 1 TABLET (10 MG TOTAL) BY MOUTH DAILY. 06/16/14   Asencion Partridge Dohmeier, MD  ibandronate (BONIVA) 150 MG tablet Take 1 tablet (150 mg total) by mouth every 30 (thirty) days. Take in the morning with a full  glass of water, on an empty stomach, and do not take anything else by mouth or lie down for the next 30 min. 08/05/13   Kennyth Arnold, FNP  Memantine HCl ER (NAMENDA XR) 28 MG CP24 Take 28 mg by mouth daily. 12/16/13   Dennie Bible, NP  Multiple Vitamin (MULTIVITAMIN WITH MINERALS) TABS tablet Take 1 tablet by mouth daily.    Historical Provider, MD  mupirocin ointment (BACTROBAN) 2 % Apply to affected area twice daily. 06/20/15   Ozella Almond Ward, PA-C  sertraline (ZOLOFT) 100 MG tablet Take 150 mg by mouth daily.     Historical Provider, MD  simvastatin (ZOCOR) 10 MG tablet Take 1 tablet (10 mg total) by mouth daily at 6 PM. 04/23/14   Kennyth Arnold, FNP  Sodium Chloride, Hypertonic, (ALTACHLORE OP) Apply to eye.    Historical Provider, MD  travoprost,  benzalkonium, (TRAVATAN) 0.004 % ophthalmic solution Place 1 drop into the right eye at bedtime.      Historical Provider, MD   BP 127/60 mmHg  Pulse 93  Temp(Src) 97.9 F (36.6 C) (Oral)  Resp 18  SpO2 96% Physical Exam  Constitutional: She is oriented to person, place, and time. She appears well-developed and well-nourished.  Alert and in no acute distress  HENT:  Head: Normocephalic and atraumatic. Head is without raccoon's eyes and without Battle's sign.  Right Ear: No hemotympanum.  Left Ear: No hemotympanum.  Neck:  Full range of motion without pain. No midline cervical tenderness.  Cardiovascular: Normal rate, regular rhythm, normal heart sounds and intact distal pulses.  Exam reveals no gallop and no friction rub.   No murmur heard. Pulmonary/Chest: Effort normal and breath sounds normal. No respiratory distress. She has no wheezes. She has no rales. She exhibits no tenderness.  Abdominal: Soft. Bowel sounds are normal. She exhibits no distension and no mass. There is no tenderness. There is no rebound and no guarding.  Musculoskeletal: She exhibits no edema.  Neurological: She is alert and oriented to person, place, and time.  Alert, oriented, thought content appropriate. Speech is goal oriented, able to follow commands.  Cranial Nerves:  II:  Peripheral visual fields grossly normal, pupils equal, round, reactive to light III, IV, VI: EOM intact bilaterally, ptosis not present V,VII: smile symmetric, eyes kept closed tightly against resistance, facial light touch sensation equal VIII: hearing grossly normal IX, X: symmetric soft palate movement, uvula elevates symmetrically  XI: bilateral shoulder shrug symmetric and strong XII: midline tongue extension 5/5 muscle strength in upper and lower extremities bilaterally including strong and equal grip strength and dorsiflexion/plantar flexion Sensory to light touch normal in all four extremities.  Normal finger-to-nose and rapid  alternating movements;   Skin: Skin is warm and dry.     Nursing note and vitals reviewed.   ED Course  Procedures (including critical care time)  LACERATION REPAIR Performed by: Ozella Almond Ward Authorized by: Ozella Almond Ward Consent: Verbal consent obtained. Risks and benefits: risks, benefits and alternatives were discussed Consent given by: patient Patient identity confirmed: provided demographic data Prepped and Draped in normal sterile fashion Wound explored Laceration Location: scalp Laceration Length: 5cm No Foreign Bodies seen or palpated Anesthesia: local infiltration Local anesthetic: lidocaine 2% Anesthetic total: 7 ml Irrigation method: syringe Amount of cleaning: standard Skin closure: 5-0  Number of sutures: 6 Technique: simple interrupted  Patient tolerance: Patient tolerated the procedure well with no immediate complications.  Labs Review Labs Reviewed - No data to  display  Imaging Review No results found. I have personally reviewed and evaluated these images and lab results as part of my medical decision-making.   EKG Interpretation None      MDM   Final diagnoses:  Laceration of head, initial encounter   VIRGIL CRUTCHLEY presents from assisted living facility with daughter for a head laceration that occurred just prior to arrival when a picture fell and hit her head. She is not on anti-coagulation. Denies loc, n/v. Daughter at bedside states that patient is at baseline mental status with no other complaints. No focal neuro deficits on exam. Laceration was repaired as dictated above. Patient tolerated well. Tetanus up-to-date. Home care instructions were given to patient, daughter, and with minimal for assisted living facility. Return precautions were given. Follow-up for suture removal discussed and included on discharge papers. All questions answered.  Patient seen by and discussed with Dr. Gilford Raid who agrees with treatment plan.   [facility] Ward, PA-C 06/20/15 [phone]

## 2015-06-20 NOTE — ED Notes (Signed)
Bed: WA07 Expected date:  Expected time:  Means of arrival:  Comments: EMS head lac.

## 2015-06-20 NOTE — ED Notes (Signed)
Head lac. Pt lying in bed at assisted living and picture fell off the wall and hit patient in the head, 5-6  Cm lac to R forehead. Bleeding controlled upon arrival. Pt alert and oriented, denies LOC. Pt c/o of headache.

## 2015-06-20 NOTE — Discharge Instructions (Signed)
Keep wound clean with mild soap and water. Keep area covered with a topical antibiotic ointment and bandage, keep bandage dry, and do not submerge in water for 24 hours. Follow up with your primary care doctor or the [facility] in approximately 5 days for wound recheck and suture removal. Monitor area for signs of infection to include, but not limited to: increasing pain, spreading redness, drainage/pus, worsening swelling, or fevers. Return to emergency department for emergent changing or worsening symptoms.   WOUND CARE  Keep area clean and dry for 24 hours. Do not remove bandage, if applied.  After 24 hours,you should change it at least once a day. Also, change the dressing if it becomes wet or dirty, or as directed by your caregiver.   Wash the wound with soap and water 2 times a day. Rinse the wound off with water to remove all soap. Pat the wound dry with a clean towel.   You may shower as usual after the first 24 hours. Do not soak the wound in water until the sutures are removed.   Once the wound has healed, scarring can be minimized by covering the wound with sunscreen during the day for 1 full year.  Do not apply any ointments or creams to the wound while stitches/staples are in place, as this may cause delayed healing.  Return if you experience any of the following signs of infection: Swelling, redness, pus drainage, streaking, fever >101.0 F  Return if you experience excessive bleeding that does not stop after 15-20 minutes of constant, firm pressure.

## 2015-06-23 DIAGNOSIS — H469 Unspecified optic neuritis: Secondary | ICD-10-CM | POA: Diagnosis not present

## 2015-07-02 ENCOUNTER — Telehealth: Payer: Self-pay | Admitting: Family Medicine

## 2015-07-02 DIAGNOSIS — R262 Difficulty in walking, not elsewhere classified: Secondary | ICD-10-CM | POA: Diagnosis not present

## 2015-07-02 DIAGNOSIS — R278 Other lack of coordination: Secondary | ICD-10-CM | POA: Diagnosis not present

## 2015-07-02 DIAGNOSIS — F0390 Unspecified dementia without behavioral disturbance: Secondary | ICD-10-CM | POA: Diagnosis not present

## 2015-07-02 NOTE — Telephone Encounter (Signed)
Pt son Merrily Pew called and said pt will not be coming back to the practice. She will be seeing a doctor that comes to the facility that she is at

## 2015-07-02 NOTE — Telephone Encounter (Signed)
I removed Dr Julianne Rice name from the PCP info line.

## 2015-07-02 NOTE — Telephone Encounter (Signed)
Note closed-see previous note-pt is no longer seeing Dr Maudie Mercury as PCP.

## 2015-07-02 NOTE — Telephone Encounter (Signed)
Ok. Please change PCP designation. Thanks.

## 2015-07-05 DIAGNOSIS — R278 Other lack of coordination: Secondary | ICD-10-CM | POA: Diagnosis not present

## 2015-07-05 DIAGNOSIS — F0390 Unspecified dementia without behavioral disturbance: Secondary | ICD-10-CM | POA: Diagnosis not present

## 2015-07-05 DIAGNOSIS — R262 Difficulty in walking, not elsewhere classified: Secondary | ICD-10-CM | POA: Diagnosis not present

## 2015-07-06 DIAGNOSIS — F0390 Unspecified dementia without behavioral disturbance: Secondary | ICD-10-CM | POA: Diagnosis not present

## 2015-07-06 DIAGNOSIS — R278 Other lack of coordination: Secondary | ICD-10-CM | POA: Diagnosis not present

## 2015-07-06 DIAGNOSIS — R262 Difficulty in walking, not elsewhere classified: Secondary | ICD-10-CM | POA: Diagnosis not present

## 2015-07-07 DIAGNOSIS — R278 Other lack of coordination: Secondary | ICD-10-CM | POA: Diagnosis not present

## 2015-07-07 DIAGNOSIS — R262 Difficulty in walking, not elsewhere classified: Secondary | ICD-10-CM | POA: Diagnosis not present

## 2015-07-07 DIAGNOSIS — F0390 Unspecified dementia without behavioral disturbance: Secondary | ICD-10-CM | POA: Diagnosis not present

## 2015-07-08 DIAGNOSIS — R278 Other lack of coordination: Secondary | ICD-10-CM | POA: Diagnosis not present

## 2015-07-08 DIAGNOSIS — F0390 Unspecified dementia without behavioral disturbance: Secondary | ICD-10-CM | POA: Diagnosis not present

## 2015-07-08 DIAGNOSIS — R262 Difficulty in walking, not elsewhere classified: Secondary | ICD-10-CM | POA: Diagnosis not present

## 2015-07-09 DIAGNOSIS — R278 Other lack of coordination: Secondary | ICD-10-CM | POA: Diagnosis not present

## 2015-07-09 DIAGNOSIS — F0390 Unspecified dementia without behavioral disturbance: Secondary | ICD-10-CM | POA: Diagnosis not present

## 2015-07-09 DIAGNOSIS — R32 Unspecified urinary incontinence: Secondary | ICD-10-CM | POA: Diagnosis not present

## 2015-07-09 DIAGNOSIS — R262 Difficulty in walking, not elsewhere classified: Secondary | ICD-10-CM | POA: Diagnosis not present

## 2015-07-12 DIAGNOSIS — R278 Other lack of coordination: Secondary | ICD-10-CM | POA: Diagnosis not present

## 2015-07-12 DIAGNOSIS — R262 Difficulty in walking, not elsewhere classified: Secondary | ICD-10-CM | POA: Diagnosis not present

## 2015-07-12 DIAGNOSIS — F0390 Unspecified dementia without behavioral disturbance: Secondary | ICD-10-CM | POA: Diagnosis not present

## 2015-07-13 DIAGNOSIS — R278 Other lack of coordination: Secondary | ICD-10-CM | POA: Diagnosis not present

## 2015-07-13 DIAGNOSIS — F0390 Unspecified dementia without behavioral disturbance: Secondary | ICD-10-CM | POA: Diagnosis not present

## 2015-07-13 DIAGNOSIS — R262 Difficulty in walking, not elsewhere classified: Secondary | ICD-10-CM | POA: Diagnosis not present

## 2015-07-14 DIAGNOSIS — R262 Difficulty in walking, not elsewhere classified: Secondary | ICD-10-CM | POA: Diagnosis not present

## 2015-07-14 DIAGNOSIS — R278 Other lack of coordination: Secondary | ICD-10-CM | POA: Diagnosis not present

## 2015-07-14 DIAGNOSIS — F0390 Unspecified dementia without behavioral disturbance: Secondary | ICD-10-CM | POA: Diagnosis not present

## 2015-07-15 DIAGNOSIS — R278 Other lack of coordination: Secondary | ICD-10-CM | POA: Diagnosis not present

## 2015-07-15 DIAGNOSIS — F0390 Unspecified dementia without behavioral disturbance: Secondary | ICD-10-CM | POA: Diagnosis not present

## 2015-07-15 DIAGNOSIS — R262 Difficulty in walking, not elsewhere classified: Secondary | ICD-10-CM | POA: Diagnosis not present

## 2015-07-19 DIAGNOSIS — R278 Other lack of coordination: Secondary | ICD-10-CM | POA: Diagnosis not present

## 2015-07-19 DIAGNOSIS — R262 Difficulty in walking, not elsewhere classified: Secondary | ICD-10-CM | POA: Diagnosis not present

## 2015-07-19 DIAGNOSIS — F0390 Unspecified dementia without behavioral disturbance: Secondary | ICD-10-CM | POA: Diagnosis not present

## 2015-07-20 DIAGNOSIS — R278 Other lack of coordination: Secondary | ICD-10-CM | POA: Diagnosis not present

## 2015-07-20 DIAGNOSIS — R262 Difficulty in walking, not elsewhere classified: Secondary | ICD-10-CM | POA: Diagnosis not present

## 2015-07-20 DIAGNOSIS — F0390 Unspecified dementia without behavioral disturbance: Secondary | ICD-10-CM | POA: Diagnosis not present

## 2015-07-21 DIAGNOSIS — F0390 Unspecified dementia without behavioral disturbance: Secondary | ICD-10-CM | POA: Diagnosis not present

## 2015-07-21 DIAGNOSIS — R262 Difficulty in walking, not elsewhere classified: Secondary | ICD-10-CM | POA: Diagnosis not present

## 2015-07-21 DIAGNOSIS — R278 Other lack of coordination: Secondary | ICD-10-CM | POA: Diagnosis not present

## 2015-07-22 DIAGNOSIS — R262 Difficulty in walking, not elsewhere classified: Secondary | ICD-10-CM | POA: Diagnosis not present

## 2015-07-22 DIAGNOSIS — R278 Other lack of coordination: Secondary | ICD-10-CM | POA: Diagnosis not present

## 2015-07-22 DIAGNOSIS — F0390 Unspecified dementia without behavioral disturbance: Secondary | ICD-10-CM | POA: Diagnosis not present

## 2015-07-23 DIAGNOSIS — R278 Other lack of coordination: Secondary | ICD-10-CM | POA: Diagnosis not present

## 2015-07-23 DIAGNOSIS — R262 Difficulty in walking, not elsewhere classified: Secondary | ICD-10-CM | POA: Diagnosis not present

## 2015-07-23 DIAGNOSIS — F0390 Unspecified dementia without behavioral disturbance: Secondary | ICD-10-CM | POA: Diagnosis not present

## 2015-07-26 DIAGNOSIS — F0390 Unspecified dementia without behavioral disturbance: Secondary | ICD-10-CM | POA: Diagnosis not present

## 2015-07-26 DIAGNOSIS — R278 Other lack of coordination: Secondary | ICD-10-CM | POA: Diagnosis not present

## 2015-07-26 DIAGNOSIS — R262 Difficulty in walking, not elsewhere classified: Secondary | ICD-10-CM | POA: Diagnosis not present

## 2015-07-27 DIAGNOSIS — R278 Other lack of coordination: Secondary | ICD-10-CM | POA: Diagnosis not present

## 2015-07-27 DIAGNOSIS — R262 Difficulty in walking, not elsewhere classified: Secondary | ICD-10-CM | POA: Diagnosis not present

## 2015-07-27 DIAGNOSIS — F0390 Unspecified dementia without behavioral disturbance: Secondary | ICD-10-CM | POA: Diagnosis not present

## 2015-07-28 DIAGNOSIS — R262 Difficulty in walking, not elsewhere classified: Secondary | ICD-10-CM | POA: Diagnosis not present

## 2015-07-28 DIAGNOSIS — F0390 Unspecified dementia without behavioral disturbance: Secondary | ICD-10-CM | POA: Diagnosis not present

## 2015-07-28 DIAGNOSIS — R278 Other lack of coordination: Secondary | ICD-10-CM | POA: Diagnosis not present

## 2015-08-19 DIAGNOSIS — L02611 Cutaneous abscess of right foot: Secondary | ICD-10-CM | POA: Diagnosis not present

## 2015-08-19 DIAGNOSIS — L03031 Cellulitis of right toe: Secondary | ICD-10-CM | POA: Diagnosis not present

## 2015-08-19 DIAGNOSIS — M79674 Pain in right toe(s): Secondary | ICD-10-CM | POA: Diagnosis not present

## 2015-09-03 DIAGNOSIS — L03031 Cellulitis of right toe: Secondary | ICD-10-CM | POA: Diagnosis not present

## 2015-09-06 ENCOUNTER — Encounter: Payer: Medicare Other | Admitting: Family Medicine

## 2015-09-14 DIAGNOSIS — L03031 Cellulitis of right toe: Secondary | ICD-10-CM | POA: Diagnosis not present

## 2015-10-19 DIAGNOSIS — R32 Unspecified urinary incontinence: Secondary | ICD-10-CM | POA: Diagnosis not present

## 2015-10-20 DIAGNOSIS — H1851 Endothelial corneal dystrophy: Secondary | ICD-10-CM | POA: Diagnosis not present

## 2015-10-20 DIAGNOSIS — Z961 Presence of intraocular lens: Secondary | ICD-10-CM | POA: Diagnosis not present

## 2015-10-22 DIAGNOSIS — D510 Vitamin B12 deficiency anemia due to intrinsic factor deficiency: Secondary | ICD-10-CM | POA: Diagnosis not present

## 2015-10-22 DIAGNOSIS — E785 Hyperlipidemia, unspecified: Secondary | ICD-10-CM | POA: Diagnosis not present

## 2015-12-13 ENCOUNTER — Ambulatory Visit: Payer: Medicare Other | Admitting: Neurology

## 2015-12-27 DIAGNOSIS — R296 Repeated falls: Secondary | ICD-10-CM | POA: Diagnosis not present

## 2015-12-29 DIAGNOSIS — M810 Age-related osteoporosis without current pathological fracture: Secondary | ICD-10-CM | POA: Diagnosis not present

## 2015-12-29 DIAGNOSIS — Z7982 Long term (current) use of aspirin: Secondary | ICD-10-CM | POA: Diagnosis not present

## 2015-12-29 DIAGNOSIS — M1991 Primary osteoarthritis, unspecified site: Secondary | ICD-10-CM | POA: Diagnosis not present

## 2015-12-29 DIAGNOSIS — E785 Hyperlipidemia, unspecified: Secondary | ICD-10-CM | POA: Diagnosis not present

## 2015-12-29 DIAGNOSIS — J449 Chronic obstructive pulmonary disease, unspecified: Secondary | ICD-10-CM | POA: Diagnosis not present

## 2015-12-29 DIAGNOSIS — D510 Vitamin B12 deficiency anemia due to intrinsic factor deficiency: Secondary | ICD-10-CM | POA: Diagnosis not present

## 2015-12-29 DIAGNOSIS — Z9181 History of falling: Secondary | ICD-10-CM | POA: Diagnosis not present

## 2015-12-29 DIAGNOSIS — H1851 Endothelial corneal dystrophy: Secondary | ICD-10-CM | POA: Diagnosis not present

## 2015-12-31 DIAGNOSIS — H1851 Endothelial corneal dystrophy: Secondary | ICD-10-CM | POA: Diagnosis not present

## 2015-12-31 DIAGNOSIS — M25551 Pain in right hip: Secondary | ICD-10-CM | POA: Diagnosis not present

## 2015-12-31 DIAGNOSIS — M79661 Pain in right lower leg: Secondary | ICD-10-CM | POA: Diagnosis not present

## 2015-12-31 DIAGNOSIS — D510 Vitamin B12 deficiency anemia due to intrinsic factor deficiency: Secondary | ICD-10-CM | POA: Diagnosis not present

## 2015-12-31 DIAGNOSIS — Z9181 History of falling: Secondary | ICD-10-CM | POA: Diagnosis not present

## 2015-12-31 DIAGNOSIS — Z7982 Long term (current) use of aspirin: Secondary | ICD-10-CM | POA: Diagnosis not present

## 2015-12-31 DIAGNOSIS — M1991 Primary osteoarthritis, unspecified site: Secondary | ICD-10-CM | POA: Diagnosis not present

## 2015-12-31 DIAGNOSIS — E785 Hyperlipidemia, unspecified: Secondary | ICD-10-CM | POA: Diagnosis not present

## 2015-12-31 DIAGNOSIS — J449 Chronic obstructive pulmonary disease, unspecified: Secondary | ICD-10-CM | POA: Diagnosis not present

## 2015-12-31 DIAGNOSIS — M810 Age-related osteoporosis without current pathological fracture: Secondary | ICD-10-CM | POA: Diagnosis not present

## 2016-01-03 DIAGNOSIS — Z7982 Long term (current) use of aspirin: Secondary | ICD-10-CM | POA: Diagnosis not present

## 2016-01-03 DIAGNOSIS — D510 Vitamin B12 deficiency anemia due to intrinsic factor deficiency: Secondary | ICD-10-CM | POA: Diagnosis not present

## 2016-01-03 DIAGNOSIS — L600 Ingrowing nail: Secondary | ICD-10-CM | POA: Diagnosis not present

## 2016-01-03 DIAGNOSIS — J449 Chronic obstructive pulmonary disease, unspecified: Secondary | ICD-10-CM | POA: Diagnosis not present

## 2016-01-03 DIAGNOSIS — B351 Tinea unguium: Secondary | ICD-10-CM | POA: Diagnosis not present

## 2016-01-03 DIAGNOSIS — M1991 Primary osteoarthritis, unspecified site: Secondary | ICD-10-CM | POA: Diagnosis not present

## 2016-01-03 DIAGNOSIS — H1851 Endothelial corneal dystrophy: Secondary | ICD-10-CM | POA: Diagnosis not present

## 2016-01-03 DIAGNOSIS — R262 Difficulty in walking, not elsewhere classified: Secondary | ICD-10-CM | POA: Diagnosis not present

## 2016-01-03 DIAGNOSIS — M79673 Pain in unspecified foot: Secondary | ICD-10-CM | POA: Diagnosis not present

## 2016-01-03 DIAGNOSIS — Z9181 History of falling: Secondary | ICD-10-CM | POA: Diagnosis not present

## 2016-01-03 DIAGNOSIS — E785 Hyperlipidemia, unspecified: Secondary | ICD-10-CM | POA: Diagnosis not present

## 2016-01-03 DIAGNOSIS — M810 Age-related osteoporosis without current pathological fracture: Secondary | ICD-10-CM | POA: Diagnosis not present

## 2016-01-05 DIAGNOSIS — J449 Chronic obstructive pulmonary disease, unspecified: Secondary | ICD-10-CM | POA: Diagnosis not present

## 2016-01-05 DIAGNOSIS — M1991 Primary osteoarthritis, unspecified site: Secondary | ICD-10-CM | POA: Diagnosis not present

## 2016-01-05 DIAGNOSIS — D510 Vitamin B12 deficiency anemia due to intrinsic factor deficiency: Secondary | ICD-10-CM | POA: Diagnosis not present

## 2016-01-05 DIAGNOSIS — H1851 Endothelial corneal dystrophy: Secondary | ICD-10-CM | POA: Diagnosis not present

## 2016-01-05 DIAGNOSIS — M810 Age-related osteoporosis without current pathological fracture: Secondary | ICD-10-CM | POA: Diagnosis not present

## 2016-01-05 DIAGNOSIS — Z9181 History of falling: Secondary | ICD-10-CM | POA: Diagnosis not present

## 2016-01-05 DIAGNOSIS — E785 Hyperlipidemia, unspecified: Secondary | ICD-10-CM | POA: Diagnosis not present

## 2016-01-05 DIAGNOSIS — Z7982 Long term (current) use of aspirin: Secondary | ICD-10-CM | POA: Diagnosis not present

## 2016-01-10 DIAGNOSIS — E785 Hyperlipidemia, unspecified: Secondary | ICD-10-CM | POA: Diagnosis not present

## 2016-01-10 DIAGNOSIS — Z9181 History of falling: Secondary | ICD-10-CM | POA: Diagnosis not present

## 2016-01-10 DIAGNOSIS — H1851 Endothelial corneal dystrophy: Secondary | ICD-10-CM | POA: Diagnosis not present

## 2016-01-10 DIAGNOSIS — M1991 Primary osteoarthritis, unspecified site: Secondary | ICD-10-CM | POA: Diagnosis not present

## 2016-01-10 DIAGNOSIS — Z7982 Long term (current) use of aspirin: Secondary | ICD-10-CM | POA: Diagnosis not present

## 2016-01-10 DIAGNOSIS — M810 Age-related osteoporosis without current pathological fracture: Secondary | ICD-10-CM | POA: Diagnosis not present

## 2016-01-10 DIAGNOSIS — J449 Chronic obstructive pulmonary disease, unspecified: Secondary | ICD-10-CM | POA: Diagnosis not present

## 2016-01-10 DIAGNOSIS — D510 Vitamin B12 deficiency anemia due to intrinsic factor deficiency: Secondary | ICD-10-CM | POA: Diagnosis not present

## 2016-01-12 DIAGNOSIS — D510 Vitamin B12 deficiency anemia due to intrinsic factor deficiency: Secondary | ICD-10-CM | POA: Diagnosis not present

## 2016-01-12 DIAGNOSIS — M810 Age-related osteoporosis without current pathological fracture: Secondary | ICD-10-CM | POA: Diagnosis not present

## 2016-01-12 DIAGNOSIS — E785 Hyperlipidemia, unspecified: Secondary | ICD-10-CM | POA: Diagnosis not present

## 2016-01-12 DIAGNOSIS — Z9181 History of falling: Secondary | ICD-10-CM | POA: Diagnosis not present

## 2016-01-12 DIAGNOSIS — M1991 Primary osteoarthritis, unspecified site: Secondary | ICD-10-CM | POA: Diagnosis not present

## 2016-01-12 DIAGNOSIS — J449 Chronic obstructive pulmonary disease, unspecified: Secondary | ICD-10-CM | POA: Diagnosis not present

## 2016-01-12 DIAGNOSIS — Z7982 Long term (current) use of aspirin: Secondary | ICD-10-CM | POA: Diagnosis not present

## 2016-01-12 DIAGNOSIS — H1851 Endothelial corneal dystrophy: Secondary | ICD-10-CM | POA: Diagnosis not present

## 2016-01-13 DIAGNOSIS — J449 Chronic obstructive pulmonary disease, unspecified: Secondary | ICD-10-CM | POA: Diagnosis not present

## 2016-01-13 DIAGNOSIS — M810 Age-related osteoporosis without current pathological fracture: Secondary | ICD-10-CM | POA: Diagnosis not present

## 2016-01-13 DIAGNOSIS — D510 Vitamin B12 deficiency anemia due to intrinsic factor deficiency: Secondary | ICD-10-CM | POA: Diagnosis not present

## 2016-01-13 DIAGNOSIS — E785 Hyperlipidemia, unspecified: Secondary | ICD-10-CM | POA: Diagnosis not present

## 2016-01-13 DIAGNOSIS — N390 Urinary tract infection, site not specified: Secondary | ICD-10-CM | POA: Diagnosis not present

## 2016-01-13 DIAGNOSIS — M1991 Primary osteoarthritis, unspecified site: Secondary | ICD-10-CM | POA: Diagnosis not present

## 2016-01-14 DIAGNOSIS — H1851 Endothelial corneal dystrophy: Secondary | ICD-10-CM | POA: Diagnosis not present

## 2016-01-14 DIAGNOSIS — Z9181 History of falling: Secondary | ICD-10-CM | POA: Diagnosis not present

## 2016-01-14 DIAGNOSIS — D510 Vitamin B12 deficiency anemia due to intrinsic factor deficiency: Secondary | ICD-10-CM | POA: Diagnosis not present

## 2016-01-14 DIAGNOSIS — E785 Hyperlipidemia, unspecified: Secondary | ICD-10-CM | POA: Diagnosis not present

## 2016-01-14 DIAGNOSIS — Z7982 Long term (current) use of aspirin: Secondary | ICD-10-CM | POA: Diagnosis not present

## 2016-01-14 DIAGNOSIS — M810 Age-related osteoporosis without current pathological fracture: Secondary | ICD-10-CM | POA: Diagnosis not present

## 2016-01-14 DIAGNOSIS — M1991 Primary osteoarthritis, unspecified site: Secondary | ICD-10-CM | POA: Diagnosis not present

## 2016-01-14 DIAGNOSIS — J449 Chronic obstructive pulmonary disease, unspecified: Secondary | ICD-10-CM | POA: Diagnosis not present

## 2016-01-17 DIAGNOSIS — Z7982 Long term (current) use of aspirin: Secondary | ICD-10-CM | POA: Diagnosis not present

## 2016-01-17 DIAGNOSIS — H1851 Endothelial corneal dystrophy: Secondary | ICD-10-CM | POA: Diagnosis not present

## 2016-01-17 DIAGNOSIS — Z9181 History of falling: Secondary | ICD-10-CM | POA: Diagnosis not present

## 2016-01-17 DIAGNOSIS — M810 Age-related osteoporosis without current pathological fracture: Secondary | ICD-10-CM | POA: Diagnosis not present

## 2016-01-17 DIAGNOSIS — E785 Hyperlipidemia, unspecified: Secondary | ICD-10-CM | POA: Diagnosis not present

## 2016-01-17 DIAGNOSIS — J449 Chronic obstructive pulmonary disease, unspecified: Secondary | ICD-10-CM | POA: Diagnosis not present

## 2016-01-17 DIAGNOSIS — M1991 Primary osteoarthritis, unspecified site: Secondary | ICD-10-CM | POA: Diagnosis not present

## 2016-01-17 DIAGNOSIS — D510 Vitamin B12 deficiency anemia due to intrinsic factor deficiency: Secondary | ICD-10-CM | POA: Diagnosis not present

## 2016-01-19 DIAGNOSIS — H1851 Endothelial corneal dystrophy: Secondary | ICD-10-CM | POA: Diagnosis not present

## 2016-01-19 DIAGNOSIS — D510 Vitamin B12 deficiency anemia due to intrinsic factor deficiency: Secondary | ICD-10-CM | POA: Diagnosis not present

## 2016-01-19 DIAGNOSIS — Z7982 Long term (current) use of aspirin: Secondary | ICD-10-CM | POA: Diagnosis not present

## 2016-01-19 DIAGNOSIS — J449 Chronic obstructive pulmonary disease, unspecified: Secondary | ICD-10-CM | POA: Diagnosis not present

## 2016-01-19 DIAGNOSIS — Z9181 History of falling: Secondary | ICD-10-CM | POA: Diagnosis not present

## 2016-01-19 DIAGNOSIS — M1991 Primary osteoarthritis, unspecified site: Secondary | ICD-10-CM | POA: Diagnosis not present

## 2016-01-19 DIAGNOSIS — M810 Age-related osteoporosis without current pathological fracture: Secondary | ICD-10-CM | POA: Diagnosis not present

## 2016-01-19 DIAGNOSIS — Z79899 Other long term (current) drug therapy: Secondary | ICD-10-CM | POA: Diagnosis not present

## 2016-01-19 DIAGNOSIS — E785 Hyperlipidemia, unspecified: Secondary | ICD-10-CM | POA: Diagnosis not present

## 2016-01-24 DIAGNOSIS — Z9181 History of falling: Secondary | ICD-10-CM | POA: Diagnosis not present

## 2016-01-24 DIAGNOSIS — M810 Age-related osteoporosis without current pathological fracture: Secondary | ICD-10-CM | POA: Diagnosis not present

## 2016-01-24 DIAGNOSIS — D510 Vitamin B12 deficiency anemia due to intrinsic factor deficiency: Secondary | ICD-10-CM | POA: Diagnosis not present

## 2016-01-24 DIAGNOSIS — H1851 Endothelial corneal dystrophy: Secondary | ICD-10-CM | POA: Diagnosis not present

## 2016-01-24 DIAGNOSIS — Z7982 Long term (current) use of aspirin: Secondary | ICD-10-CM | POA: Diagnosis not present

## 2016-01-24 DIAGNOSIS — E785 Hyperlipidemia, unspecified: Secondary | ICD-10-CM | POA: Diagnosis not present

## 2016-01-24 DIAGNOSIS — M1991 Primary osteoarthritis, unspecified site: Secondary | ICD-10-CM | POA: Diagnosis not present

## 2016-01-24 DIAGNOSIS — J449 Chronic obstructive pulmonary disease, unspecified: Secondary | ICD-10-CM | POA: Diagnosis not present

## 2016-01-27 DIAGNOSIS — J449 Chronic obstructive pulmonary disease, unspecified: Secondary | ICD-10-CM | POA: Diagnosis not present

## 2016-01-27 DIAGNOSIS — M1991 Primary osteoarthritis, unspecified site: Secondary | ICD-10-CM | POA: Diagnosis not present

## 2016-01-27 DIAGNOSIS — Z7982 Long term (current) use of aspirin: Secondary | ICD-10-CM | POA: Diagnosis not present

## 2016-01-27 DIAGNOSIS — D510 Vitamin B12 deficiency anemia due to intrinsic factor deficiency: Secondary | ICD-10-CM | POA: Diagnosis not present

## 2016-01-27 DIAGNOSIS — H1851 Endothelial corneal dystrophy: Secondary | ICD-10-CM | POA: Diagnosis not present

## 2016-01-27 DIAGNOSIS — Z9181 History of falling: Secondary | ICD-10-CM | POA: Diagnosis not present

## 2016-01-27 DIAGNOSIS — M810 Age-related osteoporosis without current pathological fracture: Secondary | ICD-10-CM | POA: Diagnosis not present

## 2016-01-27 DIAGNOSIS — E785 Hyperlipidemia, unspecified: Secondary | ICD-10-CM | POA: Diagnosis not present

## 2016-01-31 DIAGNOSIS — L98491 Non-pressure chronic ulcer of skin of other sites limited to breakdown of skin: Secondary | ICD-10-CM | POA: Diagnosis not present

## 2016-02-01 DIAGNOSIS — E785 Hyperlipidemia, unspecified: Secondary | ICD-10-CM | POA: Diagnosis not present

## 2016-02-01 DIAGNOSIS — J449 Chronic obstructive pulmonary disease, unspecified: Secondary | ICD-10-CM | POA: Diagnosis not present

## 2016-02-01 DIAGNOSIS — M810 Age-related osteoporosis without current pathological fracture: Secondary | ICD-10-CM | POA: Diagnosis not present

## 2016-02-01 DIAGNOSIS — Z9181 History of falling: Secondary | ICD-10-CM | POA: Diagnosis not present

## 2016-02-01 DIAGNOSIS — Z7982 Long term (current) use of aspirin: Secondary | ICD-10-CM | POA: Diagnosis not present

## 2016-02-01 DIAGNOSIS — H1851 Endothelial corneal dystrophy: Secondary | ICD-10-CM | POA: Diagnosis not present

## 2016-02-01 DIAGNOSIS — M1991 Primary osteoarthritis, unspecified site: Secondary | ICD-10-CM | POA: Diagnosis not present

## 2016-02-01 DIAGNOSIS — D510 Vitamin B12 deficiency anemia due to intrinsic factor deficiency: Secondary | ICD-10-CM | POA: Diagnosis not present

## 2016-02-07 DIAGNOSIS — R2681 Unsteadiness on feet: Secondary | ICD-10-CM | POA: Diagnosis not present

## 2016-02-08 DIAGNOSIS — Z9181 History of falling: Secondary | ICD-10-CM | POA: Diagnosis not present

## 2016-02-08 DIAGNOSIS — Z7982 Long term (current) use of aspirin: Secondary | ICD-10-CM | POA: Diagnosis not present

## 2016-02-08 DIAGNOSIS — E785 Hyperlipidemia, unspecified: Secondary | ICD-10-CM | POA: Diagnosis not present

## 2016-02-08 DIAGNOSIS — J449 Chronic obstructive pulmonary disease, unspecified: Secondary | ICD-10-CM | POA: Diagnosis not present

## 2016-02-08 DIAGNOSIS — M1991 Primary osteoarthritis, unspecified site: Secondary | ICD-10-CM | POA: Diagnosis not present

## 2016-02-08 DIAGNOSIS — M810 Age-related osteoporosis without current pathological fracture: Secondary | ICD-10-CM | POA: Diagnosis not present

## 2016-02-08 DIAGNOSIS — H1851 Endothelial corneal dystrophy: Secondary | ICD-10-CM | POA: Diagnosis not present

## 2016-02-08 DIAGNOSIS — D510 Vitamin B12 deficiency anemia due to intrinsic factor deficiency: Secondary | ICD-10-CM | POA: Diagnosis not present

## 2016-02-15 DIAGNOSIS — J449 Chronic obstructive pulmonary disease, unspecified: Secondary | ICD-10-CM | POA: Diagnosis not present

## 2016-02-15 DIAGNOSIS — E785 Hyperlipidemia, unspecified: Secondary | ICD-10-CM | POA: Diagnosis not present

## 2016-02-15 DIAGNOSIS — H1851 Endothelial corneal dystrophy: Secondary | ICD-10-CM | POA: Diagnosis not present

## 2016-02-15 DIAGNOSIS — M1991 Primary osteoarthritis, unspecified site: Secondary | ICD-10-CM | POA: Diagnosis not present

## 2016-02-15 DIAGNOSIS — Z7982 Long term (current) use of aspirin: Secondary | ICD-10-CM | POA: Diagnosis not present

## 2016-02-15 DIAGNOSIS — Z9181 History of falling: Secondary | ICD-10-CM | POA: Diagnosis not present

## 2016-02-15 DIAGNOSIS — M810 Age-related osteoporosis without current pathological fracture: Secondary | ICD-10-CM | POA: Diagnosis not present

## 2016-02-15 DIAGNOSIS — D510 Vitamin B12 deficiency anemia due to intrinsic factor deficiency: Secondary | ICD-10-CM | POA: Diagnosis not present

## 2016-07-05 ENCOUNTER — Emergency Department (HOSPITAL_COMMUNITY): Payer: Medicare Other

## 2016-07-05 ENCOUNTER — Encounter (HOSPITAL_COMMUNITY): Payer: Self-pay

## 2016-07-05 ENCOUNTER — Emergency Department (HOSPITAL_COMMUNITY)
Admission: EM | Admit: 2016-07-05 | Discharge: 2016-07-05 | Disposition: A | Discharge status: Skilled Nursing Facility | Payer: Medicare Other | Attending: Emergency Medicine | Admitting: Emergency Medicine

## 2016-07-05 DIAGNOSIS — Y9301 Activity, walking, marching and hiking: Secondary | ICD-10-CM | POA: Diagnosis not present

## 2016-07-05 DIAGNOSIS — S0033XA Contusion of nose, initial encounter: Secondary | ICD-10-CM | POA: Diagnosis not present

## 2016-07-05 DIAGNOSIS — S0990XA Unspecified injury of head, initial encounter: Secondary | ICD-10-CM | POA: Diagnosis not present

## 2016-07-05 DIAGNOSIS — Z7982 Long term (current) use of aspirin: Secondary | ICD-10-CM | POA: Diagnosis not present

## 2016-07-05 DIAGNOSIS — W19XXXA Unspecified fall, initial encounter: Secondary | ICD-10-CM

## 2016-07-05 DIAGNOSIS — Y929 Unspecified place or not applicable: Secondary | ICD-10-CM | POA: Insufficient documentation

## 2016-07-05 DIAGNOSIS — S0993XA Unspecified injury of face, initial encounter: Secondary | ICD-10-CM | POA: Diagnosis present

## 2016-07-05 DIAGNOSIS — W010XXA Fall on same level from slipping, tripping and stumbling without subsequent striking against object, initial encounter: Secondary | ICD-10-CM | POA: Diagnosis not present

## 2016-07-05 DIAGNOSIS — Z79899 Other long term (current) drug therapy: Secondary | ICD-10-CM | POA: Insufficient documentation

## 2016-07-05 DIAGNOSIS — Y999 Unspecified external cause status: Secondary | ICD-10-CM | POA: Insufficient documentation

## 2016-07-05 MED ORDER — ACETAMINOPHEN 500 MG PO TABS: 1000.0000 mg | ORAL_TABLET | Freq: Once | ORAL | Status: DC

## 2016-07-05 NOTE — ED Notes (Signed)
Patient transported to CT 

## 2016-07-05 NOTE — ED Notes (Signed)
Patient's son Merrily Pew called checking on patient. Informed him patient just returned from CT and waiting on those results.

## 2016-07-05 NOTE — ED Provider Notes (Signed)
[name] DEPT Provider Note   CSN: [id number] Arrival date & time: 07/05/16  0630     History   Chief Complaint Chief Complaint  Patient presents with  . Fall  . Facial Injury    HPI Becky [name] is a 83 y.o. female.  HPI   83 year old female presents with concern for mechanical fall from standing with.Reports that she was walking with out her walker this morning with her socks and slipped on the floor. Reportsshefelldirectlyontohernose.Denies other head trauma, loss of consciousness, nausea, vomiting, neckpain, numbness or weakness on one side or the other.Denies other chest pain,abdominal pain,other injuries from fall.  Pain in nose is 8/10. Has not had anything for pain yet.   Past Medical History:  Diagnosis Date  . Arthritis   . Dementia   . Depression    and anxiety  . Fibromyalgia   . Fuchs' corneal dystrophy    L eye  . Glaucoma    R eye  . Hyperlipidemia   . Migraines   . Osteoporosis   . Polio    [facility]  (age 68 yr)  . Ulcer    small- stomach    Patient Active Problem List   Diagnosis Date Noted  . Osteoporosis 05/03/2015  . MDD (major depressive disorder) 05/03/2015  . Glaucoma 05/03/2015  . Fuchs' corneal dystrophy 05/03/2015  . Dementia arising in the senium and presenium 05/25/2014  . 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 inguinal herniorrhaphy    . TUBAL LIGATION      OB History    No data available       Home Medications    Prior to Admission medications   Medication Sig Start Date End Date Taking? Authorizing Provider  acetaminophen (MAPAP) 500 MG tablet Take 500 mg by mouth every 6 (six) hours as needed (for pain).   Yes [provider]  aspirin 81 MG chewable tablet Chew 81 mg by mouth daily with breakfast.   Yes [provider]  brimonidine (ALPHAGAN) 0.2 % ophthalmic solution Place 1 drop into the right eye 2 (two) times daily.   Yes [provider]  buPROPion (WELLBUTRIN XL) 150 MG 24 hr tablet Take 150 mg by mouth daily after breakfast.    Yes [provider]  Cholecalciferol (VITAMIN D3) 5000 UNITS CAPS Take 10,000 Units by mouth every Tuesday.    Yes [provider]  clonazePAM (KLONOPIN) 0.5 MG tablet Take 0.5 tablets (0.25 mg total) by mouth 2 (two) times daily. 05/03/15  Yes Colin Benton R, DO  ibandronate (BONIVA) 150 MG tablet Take 1 tablet (150 mg total) by mouth every 30 (thirty) days. Take in the morning with a full glass of water, on an empty stomach, and do not take anything else by mouth or lie down for the next 30 min. Patient taking differently: Take 150 mg by mouth every 30 (thirty) days. Take in the morning with a full glass of water, on an empty stomach, and do not take anything else by mouth or lie down for the next 30 min. *Takes on the first Sunday of month* 08/05/13  Yes Dutch Quint B, FNP  ipratropium (ATROVENT) 0.06 % nasal spray Place 2 sprays into both nostrils daily as needed (for clearing her throat due to post nasal drip).   Yes [provider]  Memantine HCl-Donepezil HCl (NAMZARIC) 28-10 MG CP24 Take 1 capsule by mouth daily with breakfast.   Yes [provider]  Polyethyl Glycol-Propyl Glycol (SYSTANE) 0.4-0.3 % GEL ophthalmic gel Place 1 application into the left eye as needed (for fuchs' dystrophy).   Yes [provider]  Prenatal Vit-Fe Fumarate-FA (PRENATAL MULTIVITAMIN) TABS tablet Take 1 tablet by mouth daily with breakfast.   Yes [provider]  sertraline (ZOLOFT) 100 MG tablet Take 150 mg by mouth daily after breakfast.    Yes [provider]  simvastatin (ZOCOR) 10 MG tablet Take 1 tablet (10 mg total) by mouth daily at 6 PM. Patient taking differently: Take 10 mg by mouth at bedtime.  04/23/14  Yes Dutch Quint B, FNP  sodium chloride (MURO 128) 2 % ophthalmic solution Place 1 drop into the left eye 4 (four) times daily as  needed for irritation (and pain).   Yes [provider]  sodium chloride (MURO 128) 5 % ophthalmic ointment Place 1 application into the left eye 4 (four) times daily.   Yes [provider]  Travoprost, BAK Free, (TRAVATAN) 0.004 % SOLN ophthalmic solution Place 1 drop into the right eye at bedtime.   Yes [provider]  Zinc Oxide (SECURA EXTRA PROTECTIVE EX) Apply 1 application topically 2 (two) times daily as needed (for irritation).   Yes [provider]  zinc oxide 20 % ointment Apply 1 application topically 2 (two) times daily as needed for irritation.   Yes [provider]  donepezil (ARICEPT) 10 MG tablet TAKE 1 TABLET (10 MG TOTAL) BY MOUTH DAILY. Patient not taking: Reported on 07/05/2016 06/16/14   Dohmeier, Asencion Partridge, MD  Memantine HCl ER (NAMENDA XR) 28 MG CP24 Take 28 mg by mouth daily. Patient not taking: Reported on 07/05/2016 12/16/13   Dennie Bible, NP    Family History Family History  Problem Relation Age of Onset  . Arthritis Brother   . Cancer Brother   . Alcohol abuse      addiction   . Arthritis    . Colon cancer      1st degree relative <60  . Diabetes      1st degree relative  . Psychosis      Social History Social History  Substance Use Topics  . Smoking status: Never Smoker  . Smokeless tobacco: Never Used  . Alcohol use No     Allergies   Patient has no known allergies.   Review of Systems Review of Systems  Constitutional: Negative for fever.  HENT: Positive for facial swelling (nose pain). Negative for sore throat.   Eyes: Negative for visual disturbance.  Respiratory: Negative for cough and shortness of breath.   Cardiovascular: Negative for chest pain.  Gastrointestinal: Negative for abdominal pain.  Genitourinary: Negative for difficulty urinating.  Musculoskeletal: Negative for back pain and neck pain.  Skin: Negative for rash.  Neurological: Negative for syncope, weakness, numbness and  headaches.     Physical Exam Updated Vital Signs BP (!) 100/57 (BP Location: Left Arm)   Pulse 80   Temp 97.9 F (36.6 C) (Oral)   Resp 15   SpO2 96%   Physical Exam  Constitutional: She is oriented to person, place, and time. She appears well-developed and well-nourished. No distress.  HENT:  Head: Normocephalic. Head is without raccoon's eyes and without Battle's sign.  Nose: Nasal deformity present. No septal deviation or nasal septal hematoma.  Contusion left nasal bone and tenderness  Eyes: Conjunctivae and EOM are normal.  Neck: Normal range of motion.  Cardiovascular: Normal rate, regular rhythm, normal heart  sounds and intact distal pulses.  Exam reveals no gallop and no friction rub.   No murmur heard. Pulmonary/Chest: Effort normal and breath sounds normal. No respiratory distress. She has no wheezes. She has no rales.  Abdominal: Soft. She exhibits no distension. There is no tenderness. There is no guarding.  Musculoskeletal: She exhibits no edema or tenderness.       Cervical back: She exhibits no bony tenderness.  Neurological: She is alert and oriented to person, place, and time. No sensory deficit.  Skin: Skin is warm and dry. No rash noted. She is not diaphoretic. No erythema.  Nursing note and vitals reviewed.    ED Treatments / Results  Labs (all labs ordered are listed, but only abnormal results are displayed) Labs Reviewed - No data to display  EKG  EKG Interpretation None       Radiology Ct Head Wo Contrast  Result Date: 07/05/2016 CLINICAL DATA:  Fall, hit nose. EXAM: CT HEAD WITHOUT CONTRAST TECHNIQUE: Contiguous axial images were obtained from the base of the skull through the vertex without intravenous contrast. COMPARISON:  04/17/2010 FINDINGS: Brain: Chronic small vessel disease changes throughout the deep white matter. No acute intracranial abnormality. Specifically, no hemorrhage, hydrocephalus, mass lesion, acute infarction, or significant  intracranial injury. Vascular: No hyperdense vessel or unexpected calcification. Skull: No acute calvarial abnormality. Sinuses/Orbits: Visualized paranasal sinuses and mastoids clear. Orbital soft tissues unremarkable. Other: None IMPRESSION: Chronic small vessel disease.  No acute intracranial abnormality. Electronically Signed   By: Rolm Baptise M.D.   On: 07/05/2016 08:37    Procedures Procedures (including critical care time)  Medications Ordered in ED Medications  acetaminophen (TYLENOL) tablet 1,000 mg (1,000 mg Oral Refused 07/05/16 0816)     Initial Impression / Assessment and Plan / ED Course  I have reviewed the triage vital signs and the nursing notes.  Pertinent labs & imaging results that were available during my care of the patient were reviewed by me and considered in my medical decision making (see chart for details).     83 year old female with history of hyperlipidemia, glaucoma, depression, presents with concern for mechanical fall from standing with nose pain.  CT head done shows no acute abnormalities. No sign of nasal septal hematoma on exam. No other signs of trauma, no midline neck tenderness or pain, normal strength and sensation. Low suspicion for other injuries.  Recommend ice, Tylenol for pain. Patient discharged in stable condition with understanding of reasons to return.   Final Clinical Impressions(s) / ED Diagnoses   Final diagnoses:  Fall, initial encounter  Contusion of nose, initial encounter    New Prescriptions New Prescriptions   No medications on file     Gareth Morgan, MD 07/05/16 [phone]

## 2016-07-05 NOTE — ED Notes (Signed)
Bed: [facility] Expected date:  Expected time:  Means of arrival:  Comments: Fall, face injury

## 2016-07-05 NOTE — ED Triage Notes (Signed)
Pt brought in via EMS from [facility] and had a mechanical fall tripping over hardwood floor while ambulating in her socks. Pt is c/o  of  nose pain and per EMS pt fell on her face, no LOC is reported.   VS BO 136/78 HR 76, RR 16, O@ 98%

## 2016-07-05 NOTE — ED Notes (Signed)
Per Idelle Jo, patient's grandson will be coming to pick patient up and take back to SNF.

## 2016-07-05 NOTE — ED Notes (Signed)
ED Provider at bedside. 

## 2016-07-05 NOTE — ED Notes (Signed)
Gave discharge papers to pt's grandson.

## 2016-07-05 NOTE — ED Notes (Signed)
Pt is alert and orineted x 4 and is verbally responsive. Pt lives alone and states that she  was at home and had a fall face forward, pt reports that she was ambulating without her walker. Pt nose appear bruised, and has 8/10 pain.

## 2016-08-08 ENCOUNTER — Emergency Department (HOSPITAL_COMMUNITY)
Admission: EM | Admit: 2016-08-08 | Discharge: 2016-08-08 | Disposition: A | Discharge status: Home / Self Care | Payer: Medicare Other | Attending: Emergency Medicine | Admitting: Emergency Medicine

## 2016-08-08 ENCOUNTER — Emergency Department (HOSPITAL_COMMUNITY): Payer: Medicare Other

## 2016-08-08 ENCOUNTER — Encounter (HOSPITAL_COMMUNITY): Payer: Self-pay

## 2016-08-08 DIAGNOSIS — Z79899 Other long term (current) drug therapy: Secondary | ICD-10-CM | POA: Insufficient documentation

## 2016-08-08 DIAGNOSIS — S0990XA Unspecified injury of head, initial encounter: Secondary | ICD-10-CM | POA: Diagnosis present

## 2016-08-08 DIAGNOSIS — Z23 Encounter for immunization: Secondary | ICD-10-CM | POA: Insufficient documentation

## 2016-08-08 DIAGNOSIS — S0101XA Laceration without foreign body of scalp, initial encounter: Secondary | ICD-10-CM | POA: Insufficient documentation

## 2016-08-08 DIAGNOSIS — W19XXXA Unspecified fall, initial encounter: Secondary | ICD-10-CM

## 2016-08-08 DIAGNOSIS — W06XXXA Fall from bed, initial encounter: Secondary | ICD-10-CM | POA: Diagnosis not present

## 2016-08-08 DIAGNOSIS — Y999 Unspecified external cause status: Secondary | ICD-10-CM | POA: Insufficient documentation

## 2016-08-08 DIAGNOSIS — Y92129 Unspecified place in nursing home as the place of occurrence of the external cause: Secondary | ICD-10-CM | POA: Diagnosis not present

## 2016-08-08 DIAGNOSIS — Y939 Activity, unspecified: Secondary | ICD-10-CM | POA: Insufficient documentation

## 2016-08-08 DIAGNOSIS — Z7982 Long term (current) use of aspirin: Secondary | ICD-10-CM | POA: Insufficient documentation

## 2016-08-08 MED ORDER — TETANUS-DIPHTH-ACELL PERTUSSIS 5-2.5-18.5 LF-MCG/0.5 IM SUSP
0.5000 mL | Freq: Once | INTRAMUSCULAR | Status: AC
  Administered 2016-08-08: 0.5 mL via INTRAMUSCULAR
  Filled 2016-08-08: qty 0.5

## 2016-08-08 NOTE — Discharge Instructions (Signed)
It was my pleasure taking care of you today!   Thankfully, you had a CT scan of your head and cervical spine that was negative for injury from your fall today. You did have to have three staples placed to a cut on the back of your head.   Keep the laceration site dry for the next 24 hours and leave the dressing in place. After 24 hours you may remove the dressing and gently clean the laceration site with antibacterial soap and warm water. Do not scrub the area. Do not soak the area and water for long periods of time. Apply topical antibiotic ointment 1-2 times per day for the next 7 days. Return to the emergency department in 7 days for removal of the staples.  Scalp laceration generally heal very well with minimal risk of infection, however, you should return sooner for any signs of infection which would include increased redness around the wound, increased swelling, new drainage of yellow pus.

## 2016-08-08 NOTE — ED Triage Notes (Signed)
Per EMS- Patient is a resident of [facility]. Patient had an unwitnessed fall. Laceration to back of the head. Patient does not remember how she fell. Patient has a history of dementia. Patient does c/o headache.

## 2016-08-08 NOTE — ED Notes (Signed)
Patient's family called and stated that they would be taking the patient back to [facility] and did not want patient going by ambulance.

## 2016-08-08 NOTE — ED Provider Notes (Signed)
[facility] DEPT Provider Note   CSN: [id number] Arrival date & time: 08/08/16  0719     History   Chief Complaint Chief Complaint  Patient presents with  . Fall    HPI LINET BRASH is a 83 y.o. female.  The history is provided by the patient, medical records, the nursing home and the EMS personnel. No language interpreter was used.  Fall  Associated symptoms include headaches. Pertinent negatives include no shortness of breath.   ROYANNE WARSHAW is a 83 y.o. female  with a PMH of dementia who presents to the Emergency Department from nursing facility for evaluation following an unwitnessed fall. Patient states that she got out of bed this morning and fell, but unsure exactly how this happened. Complaining of posterior headache. No chest/abdomen/back/extremity pain. No n/v.   Level V caveat applies 2/2 dementia  Past Medical History:  Diagnosis Date  . Arthritis   . Dementia   . Depression    and anxiety  . Fibromyalgia   . Fuchs' corneal dystrophy    L eye  . Glaucoma    R eye  . Hyperlipidemia   . Migraines   . Osteoporosis   . Polio    PPS  (age 65 yr)  . Ulcer    small- stomach    Patient Active Problem List   Diagnosis Date Noted  . Osteoporosis 05/03/2015  . MDD (major depressive disorder) 05/03/2015  . Glaucoma 05/03/2015  . Fuchs' corneal dystrophy 05/03/2015  . Dementia arising in the senium and presenium 05/25/2014  . 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 inguinal herniorrhaphy    . TUBAL LIGATION      OB History    No data available       Home Medications    Prior to Admission medications   Medication Sig Start Date End Date Taking? Authorizing Provider  acetaminophen (MAPAP) 500 MG tablet Take 500 mg by mouth every 6 (six) hours as needed (for pain).   Yes [provider]  aspirin 81 MG chewable tablet Chew 81 mg by mouth daily with breakfast.   Yes [provider]  brimonidine (ALPHAGAN) 0.2 % ophthalmic solution Place 1 drop into the right eye 2 (two) times daily.   Yes [provider]  buPROPion (WELLBUTRIN XL) 150 MG 24 hr tablet Take 150 mg by mouth daily after breakfast.    Yes [provider]  cefdinir (OMNICEF) 300 MG capsule Take 300 mg by mouth 2 (two) times daily. Started 06/09 for 10 days   Yes [provider]  Cholecalciferol (VITAMIN D3) 5000 UNITS CAPS Take 10,000 Units by mouth every Tuesday.    Yes [provider]  clonazePAM (KLONOPIN) 0.5 MG tablet Take 0.5 tablets (0.25 mg total) by mouth 2 (two) times daily. 05/03/15  Yes Colin Benton R, DO  ibandronate (BONIVA) 150 MG tablet Take 1 tablet (150 mg total) by mouth every 30 (thirty) days. Take in the morning with a full glass of water, on an empty stomach, and do not take anything else by mouth or lie down for the next 30 min. Patient taking differently: Take 150 mg by mouth every 30 (thirty) days. Take in the morning with a full glass of water, on an empty stomach, and do not take anything else by mouth or lie down for the next 30 min. *Takes on the first Sunday of month* 08/05/13  Yes Kennyth Arnold, FNP  ipratropium (ATROVENT) 0.06 % nasal spray Place 2 sprays into both nostrils daily as needed (for clearing her throat due to post nasal drip).   Yes [provider]  Memantine HCl-Donepezil HCl (NAMZARIC) 28-10 MG CP24 Take 1 capsule by mouth daily with breakfast.   Yes [provider]  Polyethyl Glycol-Propyl Glycol (SYSTANE) 0.4-0.3 % GEL ophthalmic gel Place 1 application into the left eye as needed (for fuchs' dystrophy).   Yes [provider]  Prenatal Vit-Fe Fumarate-FA (PRENATAL MULTIVITAMIN) TABS tablet Take 1 tablet by mouth daily with breakfast.   Yes [provider]  sertraline (ZOLOFT) 100 MG tablet Take 150 mg by mouth daily after breakfast.    Yes [provider]  simvastatin (ZOCOR) 10  MG tablet Take 1 tablet (10 mg total) by mouth daily at 6 PM. Patient taking differently: Take 10 mg by mouth at bedtime.  04/23/14  Yes Dutch Quint B, FNP  sodium chloride (MURO 128) 2 % ophthalmic solution Place 1 drop into the left eye 4 (four) times daily as needed for irritation (and pain).   Yes [provider]  sodium chloride (MURO 128) 5 % ophthalmic ointment Place 1 application into the left eye 4 (four) times daily.   Yes [provider]  Travoprost, BAK Free, (TRAVATAN) 0.004 % SOLN ophthalmic solution Place 1 drop into the right eye at bedtime.   Yes [provider]  Zinc Oxide (SECURA EXTRA PROTECTIVE EX) Apply 1 application topically 2 (two) times daily as needed (for irritation). Apply to peri area   Yes [provider]  zinc oxide 20 % ointment Apply 1 application topically 2 (two) times daily as needed for irritation. Apply to peri area   Yes [provider]  donepezil (ARICEPT) 10 MG tablet TAKE 1 TABLET (10 MG TOTAL) BY MOUTH DAILY. Patient not taking: Reported on 07/05/2016 06/16/14   Dohmeier, Asencion Partridge, MD  Memantine HCl ER (NAMENDA XR) 28 MG CP24 Take 28 mg by mouth daily. Patient not taking: Reported on 07/05/2016 12/16/13   Dennie Bible, NP    Family History Family History  Problem Relation Age of Onset  . Arthritis Brother   . Cancer Brother   . Alcohol abuse Unknown        addiction   . Arthritis Unknown   . Colon cancer Unknown        1st degree relative <60  . Diabetes Unknown        1st degree relative  . Psychosis Unknown     Social History Social History  Substance Use Topics  . Smoking status: Never Smoker  . Smokeless tobacco: Never Used  . Alcohol use No     Allergies   Patient has no known allergies.   Review of Systems Review of Systems  Unable to perform ROS: Dementia  Respiratory: Negative for shortness of breath.   Musculoskeletal: Negative for arthralgias and myalgias.  Skin:  Positive for wound.  Neurological: Positive for headaches.     Physical Exam Updated Vital Signs BP 116/76 (BP Location: Left Arm)   Pulse (!) 103   Temp 98 F (36.7 C) (Oral)   Resp 16   Ht 5\' 4"  (1.626 m)   Wt 59 kg (130 lb)   SpO2 95%   BMI 22.31 kg/m   Physical Exam  Constitutional: She appears well-developed and well-nourished. No distress.  HENT:  Head: Normocephalic. Head is without raccoon's eyes and without Battle's sign.    Right Ear: No  hemotympanum.  Left Ear: No hemotympanum.  Nose: Nose normal.  Cardiovascular: Regular rhythm and normal heart sounds.   No murmur heard. Pulmonary/Chest: Effort normal and breath sounds normal. No respiratory distress. She has no wheezes. She has no rales. She exhibits no tenderness.  Abdominal: Soft. She exhibits no distension. There is no tenderness.  Musculoskeletal:  No tenderness to upper or lower extremities. No C/T/L spine tenderness. Moves all extremities well with full strength.  Neurological: She is alert.  Speech clear and goal oriented. CN 2-12 grossly intact. Sensation intact.  Skin: Skin is warm and dry.  Nursing note and vitals reviewed.    ED Treatments / Results  Labs (all labs ordered are listed, but only abnormal results are displayed) Labs Reviewed - No data to display  EKG  EKG Interpretation None       Radiology Ct Head Wo Contrast  Result Date: 08/08/2016 CLINICAL DATA:  Pain following fall EXAM: CT HEAD WITHOUT CONTRAST CT CERVICAL SPINE WITHOUT CONTRAST TECHNIQUE: Multidetector CT imaging of the head and cervical spine was performed following the standard protocol without intravenous contrast. Multiplanar CT image reconstructions of the cervical spine were also generated. COMPARISON:  Head CT Jul 05, 2016; cervical spine CT October 03, 2014 FINDINGS: CT HEAD FINDINGS Brain: There is mild diffuse atrophy. There is no intracranial mass, hemorrhage, extra-axial fluid collection, or midline shift.  There is patchy small vessel disease in the centra semiovale bilaterally. There is evidence of a prior small infarct in a portion of the head of the caudate nucleus on the left, stable. There is small vessel disease in each anterior lentiform nucleus region, slightly more on the left than on the right, stable. There is no new gray-white compartment lesion. No acute infarct evident. Vascular: There is no appreciable hyperdense vessel. There is mild calcification in each carotid siphon. Skull: The bony calvarium appears intact. Sinuses/Orbits: There is a small air-fluid level in the lateral right sphenoid region. There is mucosal thickening in several ethmoid air cells bilaterally. Frontal sinuses are aplastic. Visualized paranasal sinuses elsewhere clear. Visualized orbits appear symmetric bilaterally. Other: Mastoid air cells are clear. CT CERVICAL SPINE FINDINGS Alignment: There is 2 mm of anterolisthesis of C4 on C5. There is 2 mm of anterolisthesis of C5 on C6. There is 2 mm of C7 on T1. These changes are essentially stable compared to prior study and felt to be due to underlying spondylosis. Skull base and vertebrae: There is moderate pannus posterior to the odontoid, not causing significant impression on the craniocervical junction. Skull base and craniocervical junction regions appear unremarkable. There is no evident fracture. There are no blastic or lytic bone lesions. There is a prominent cyst in the midportion in the odontoid without impending fracture, stable. Soft tissues and spinal canal: Prevertebral soft tissues and predental space regions are normal. No paraspinous lesions are evident. There is no cord or canal hematoma appreciable. Disc levels: There is marked disc space narrowing at C4-5, C5-6, C6-7, and C7-T1. There is moderate disc space narrowing at C3-4. There is multilevel facet hypertrophy at multiple levels with exit foraminal narrowing most notable at C5-6 on the left at C6-7 on the right.  No frank disc extrusion or stenosis. Upper chest: Visualized upper lung regions appear clear. Other: There is atherosclerotic calcification in each carotid artery. There is also calcification in the proximal left subclavian artery. IMPRESSION: CT head: Stable atrophy with supratentorial small vessel disease. No intracranial mass, hemorrhage, or extra-axial fluid collection. No evident acute  infarct. There is slight arteriovascular calcification. There are areas of paranasal sinus disease. Cervical spine: No evident fracture. Stable areas of mild spondylolisthesis at multiple levels are felt to be due to underlying spondylosis. There is multilevel osteoarthritic change. No disc extrusion or high-grade stenosis. There are foci of vascular calcification including calcification in both carotid arteries. Electronically Signed   By: Lowella Grip III M.D.   On: 08/08/2016 09:11   Ct Cervical Spine Wo Contrast  Result Date: 08/08/2016 CLINICAL DATA:  Pain following fall EXAM: CT HEAD WITHOUT CONTRAST CT CERVICAL SPINE WITHOUT CONTRAST TECHNIQUE: Multidetector CT imaging of the head and cervical spine was performed following the standard protocol without intravenous contrast. Multiplanar CT image reconstructions of the cervical spine were also generated. COMPARISON:  Head CT Jul 05, 2016; cervical spine CT October 03, 2014 FINDINGS: CT HEAD FINDINGS Brain: There is mild diffuse atrophy. There is no intracranial mass, hemorrhage, extra-axial fluid collection, or midline shift. There is patchy small vessel disease in the centra semiovale bilaterally. There is evidence of a prior small infarct in a portion of the head of the caudate nucleus on the left, stable. There is small vessel disease in each anterior lentiform nucleus region, slightly more on the left than on the right, stable. There is no new gray-white compartment lesion. No acute infarct evident. Vascular: There is no appreciable hyperdense vessel. There is mild  calcification in each carotid siphon. Skull: The bony calvarium appears intact. Sinuses/Orbits: There is a small air-fluid level in the lateral right sphenoid region. There is mucosal thickening in several ethmoid air cells bilaterally. Frontal sinuses are aplastic. Visualized paranasal sinuses elsewhere clear. Visualized orbits appear symmetric bilaterally. Other: Mastoid air cells are clear. CT CERVICAL SPINE FINDINGS Alignment: There is 2 mm of anterolisthesis of C4 on C5. There is 2 mm of anterolisthesis of C5 on C6. There is 2 mm of C7 on T1. These changes are essentially stable compared to prior study and felt to be due to underlying spondylosis. Skull base and vertebrae: There is moderate pannus posterior to the odontoid, not causing significant impression on the craniocervical junction. Skull base and craniocervical junction regions appear unremarkable. There is no evident fracture. There are no blastic or lytic bone lesions. There is a prominent cyst in the midportion in the odontoid without impending fracture, stable. Soft tissues and spinal canal: Prevertebral soft tissues and predental space regions are normal. No paraspinous lesions are evident. There is no cord or canal hematoma appreciable. Disc levels: There is marked disc space narrowing at C4-5, C5-6, C6-7, and C7-T1. There is moderate disc space narrowing at C3-4. There is multilevel facet hypertrophy at multiple levels with exit foraminal narrowing most notable at C5-6 on the left at C6-7 on the right. No frank disc extrusion or stenosis. Upper chest: Visualized upper lung regions appear clear. Other: There is atherosclerotic calcification in each carotid artery. There is also calcification in the proximal left subclavian artery. IMPRESSION: CT head: Stable atrophy with supratentorial small vessel disease. No intracranial mass, hemorrhage, or extra-axial fluid collection. No evident acute infarct. There is slight arteriovascular calcification.  There are areas of paranasal sinus disease. Cervical spine: No evident fracture. Stable areas of mild spondylolisthesis at multiple levels are felt to be due to underlying spondylosis. There is multilevel osteoarthritic change. No disc extrusion or high-grade stenosis. There are foci of vascular calcification including calcification in both carotid arteries. Electronically Signed   By: Lowella Grip III M.D.   On: 08/08/2016 09:11  Procedures Procedures (including critical care time)  LACERATION REPAIR Performed by: Ozella Almond Ward Consent: Verbal consent obtained. Risks and benefits: risks, benefits and alternatives were discussed Patient identity confirmed: provided demographic data Time out performed prior to procedure Prepped and Draped in normal sterile fashion Wound explored Laceration Location: Posterior head Laceration Length: 3.5 cm No Foreign Bodies seen or palpated on wound exploration Anesthesia: none Irrigation method: syringe Amount of cleaning: standard Skin closure: Staples Number of sutures or staples: 3 Patient tolerance: Patient tolerated the procedure well with no immediate complications.   Medications Ordered in ED Medications  Tdap (BOOSTRIX) injection 0.5 mL (0.5 mLs Intramuscular Given 08/08/16 0802)     Initial Impression / Assessment and Plan / ED Course  I have reviewed the triage vital signs and the nursing notes.  Pertinent labs & imaging results that were available during my care of the patient were reviewed by me and considered in my medical decision making (see chart for details).    JERA HEADINGS is a 83 y.o. female who presents to ED for evaluation following unwitnessed fall. She has a scalp laceration which was repaired with staples as dictated above. Tetanus updated. No other signs of injury from fall. CT head and cervical spine with no acute abnormalities. Home care and return precautions discussed with patient, but also included in  discharge paperwork for facility. All questions answered.   Patient seen by and discussed with Dr. Ashok Cordia who agrees with treatment plan.    Final Clinical Impressions(s) / ED Diagnoses   Final diagnoses:  Fall  Injury of head, initial encounter  Laceration of scalp without foreign body, initial encounter    New Prescriptions New Prescriptions   No medications on file     Ward, Ozella Almond, PA-C 08/08/16 1022    Lajean Saver, MD 08/08/16 1406

## 2016-08-08 NOTE — ED Notes (Signed)
Bed: WG66 Expected date:  Expected time:  Means of arrival:  Comments: 83 yo F  Fall

## 2017-05-07 ENCOUNTER — Encounter (HOSPITAL_COMMUNITY): Payer: Self-pay

## 2017-05-07 ENCOUNTER — Emergency Department (HOSPITAL_COMMUNITY)
Admission: EM | Admit: 2017-05-07 | Discharge: 2017-05-07 | Disposition: A | Discharge status: Home / Self Care | Payer: Medicare Other | Attending: Emergency Medicine | Admitting: Emergency Medicine

## 2017-05-07 DIAGNOSIS — R220 Localized swelling, mass and lump, head: Secondary | ICD-10-CM | POA: Insufficient documentation

## 2017-05-07 DIAGNOSIS — Z7982 Long term (current) use of aspirin: Secondary | ICD-10-CM | POA: Insufficient documentation

## 2017-05-07 DIAGNOSIS — F0390 Unspecified dementia without behavioral disturbance: Secondary | ICD-10-CM | POA: Diagnosis not present

## 2017-05-07 DIAGNOSIS — Z79899 Other long term (current) drug therapy: Secondary | ICD-10-CM | POA: Diagnosis not present

## 2017-05-07 DIAGNOSIS — E785 Hyperlipidemia, unspecified: Secondary | ICD-10-CM | POA: Insufficient documentation

## 2017-05-07 MED ORDER — AMOXICILLIN-POT CLAVULANATE 875-125 MG PO TABS
1.0000 | ORAL_TABLET | Freq: Once | ORAL | Status: AC
  Administered 2017-05-07: 1 via ORAL
  Filled 2017-05-07: qty 1

## 2017-05-07 MED ORDER — AMOXICILLIN-POT CLAVULANATE 875-125 MG PO TABS: 1.0000 | ORAL_TABLET | Freq: Two times a day (BID) | ORAL | 0 refills | Status: DC

## 2017-05-07 NOTE — ED Notes (Signed)
Bed: DP82 Expected date:  Expected time:  Means of arrival:  Comments: 84 yo f, possible allergic reaction

## 2017-05-07 NOTE — ED Triage Notes (Addendum)
Poor historian. No new meds today per assisted living. Pt did not eat today per EMS. Oral swelling (under tongue). Pt given 0.3 epi IM, 50 benadryl IV and 125 solumedrol IV PTA

## 2017-05-07 NOTE — ED Provider Notes (Signed)
[facility] DEPT Provider Note   CSN: [id number] Arrival date & time: 05/07/17  1308     History   Chief Complaint Chief Complaint  Patient presents with  . Allergic Reaction    HPI RUNELL Becky [name] is a 84 y.o. female.  HPI  Patient presents with concern of possible allergic reaction. Patient has dementia, but does note that she felt differently today, feels some fullness in her mouth, denies swallowing, speaking, breathing difficulty. She arrives from her nursing facility with concern of new swelling underneath her tongue. No reported syncope, breathing difficulty, change in behavior. Patient received epinephrine, Benadryl, Solu-Medrol and was transported here for evaluation.    Past Medical History:  Diagnosis Date  . Arthritis   . Dementia   . Depression    and anxiety  . Fibromyalgia   . Fuchs' corneal dystrophy    L eye  . Glaucoma    R eye  . Hyperlipidemia   . Migraines   . Osteoporosis   . Polio    PPS  (age 63 yr)  . Ulcer    small- stomach    Patient Active Problem List   Diagnosis Date Noted  . Osteoporosis 05/03/2015  . MDD (major depressive disorder) 05/03/2015  . Glaucoma 05/03/2015  . Fuchs' corneal dystrophy 05/03/2015  . Dementia arising in the senium and presenium 05/25/2014  . 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 inguinal herniorrhaphy    . TUBAL LIGATION      OB History    No data available       Home Medications    Prior to Admission medications   Medication Sig Start Date End Date Taking? Authorizing Provider  aspirin 81 MG chewable tablet Chew 81 mg by mouth daily with breakfast.   Yes [provider]  brimonidine (ALPHAGAN) 0.2 % ophthalmic solution Place 1 drop into the right eye 2 (two) times daily.   Yes [provider]  buPROPion (WELLBUTRIN XL) 150 MG 24 hr tablet Take 150 mg by mouth daily after breakfast.     Yes [provider]  ibandronate (BONIVA) 150 MG tablet Take 1 tablet (150 mg total) by mouth every 30 (thirty) days. Take in the morning with a full glass of water, on an empty stomach, and do not take anything else by mouth or lie down for the next 30 min. Patient taking differently: Take 150 mg by mouth every 30 (thirty) days. Take in the morning with a full glass of water, on an empty stomach, and do not take anything else by mouth or lie down for the next 30 min. *Takes on the first Sunday of month* 08/05/13  Yes Dutch Quint B, FNP  Memantine HCl-Donepezil HCl (NAMZARIC) 28-10 MG CP24 Take 1 capsule by mouth daily with breakfast.   Yes [provider]  Polyvinyl Alcohol (LIQUITEARS OP) Place 1 drop into both eyes every 4 (four) hours while awake.   Yes [provider]  sertraline (ZOLOFT) 100 MG tablet Take 150 mg by mouth daily after breakfast.    Yes [provider]  simvastatin (ZOCOR) 10 MG tablet Take 1 tablet (10 mg total) by mouth daily at 6 PM. Patient taking differently: Take 10 mg by mouth at bedtime.  04/23/14  Yes Dutch Quint B, FNP  sodium chloride (ALTACHLORE) 5 % ophthalmic ointment Place 1 application into the left eye 4 (four) times daily.   Yes [provider]  Travoprost, BAK Free, (TRAVATAN) 0.004 % SOLN ophthalmic solution Place 1 drop into the right eye at bedtime.   Yes [provider]  UNABLE TO FIND MED PASS-4 OZ TWICE DAILY FOR NUTRITIONAL SUPPORT   Yes [provider]  acetaminophen (MAPAP) 500 MG tablet Take 500 mg by mouth every 6 (six) hours as needed (for pain).    [provider]  Cholecalciferol (VITAMIN D3) 5000 UNITS CAPS Take 10,000 Units by mouth every Tuesday.     [provider]  clonazePAM (KLONOPIN) 0.5 MG tablet Take 0.5 tablets (0.25 mg total) by mouth 2 (two) times daily. 05/03/15   Lucretia Kern, DO  donepezil (ARICEPT) 10 MG tablet TAKE 1 TABLET (10 MG TOTAL) BY MOUTH  DAILY. Patient not taking: Reported on 05/07/2017 06/16/14   Dohmeier, Asencion Partridge, MD  ipratropium (ATROVENT) 0.06 % nasal spray Place 2 sprays into both nostrils daily as needed (for clearing her throat due to post nasal drip).    [provider]  Memantine HCl ER (NAMENDA XR) 28 MG CP24 Take 28 mg by mouth daily. Patient not taking: Reported on 05/07/2017 12/16/13   Dennie Bible, NP  Polyethyl Glycol-Propyl Glycol (SYSTANE) 0.4-0.3 % GEL ophthalmic gel Place 1 application into the left eye as needed (for fuchs' dystrophy).    [provider]    Family History Family History  Problem Relation Age of Onset  . Arthritis Brother   . Cancer Brother   . Alcohol abuse Unknown        addiction   . Arthritis Unknown   . Colon cancer Unknown        1st degree relative <60  . Diabetes Unknown        1st degree relative  . Psychosis Unknown     Social History Social History   Tobacco Use  . Smoking status: Never Smoker  . Smokeless tobacco: Never Used  Substance Use Topics  . Alcohol use: No  . Drug use: No     Allergies   Patient has no known allergies.   Review of Systems Review of Systems  Unable to perform ROS: Dementia     Physical Exam Updated Vital Signs BP 130/84   Pulse 100   Temp (!) 97.4 F (36.3 C) (Oral)   Resp 15   Ht 5\' 5"  (1.651 m)   Wt 59 kg (130 lb)   SpO2 94%   BMI 21.63 kg/m   Physical Exam  Constitutional: She appears well-developed and well-nourished. No distress.  HENT:  Head: Normocephalic and atraumatic.  Mouth/Throat:    Eyes: Conjunctivae and EOM are normal.  Cardiovascular: Normal rate and regular rhythm.  Pulmonary/Chest: Effort normal and breath sounds normal. No stridor. No respiratory distress.  Abdominal: She exhibits no distension.  Musculoskeletal: She exhibits no edema.  Neurological: She is alert. She displays atrophy. No cranial nerve deficit.  Skin: Skin is warm and dry.  Psychiatric: She is  slowed and withdrawn. Cognition and memory are impaired.  Nursing note and vitals reviewed.    ED Treatments / Results       Procedures Procedures (including critical care time)  Medications Ordered in ED Medications  amoxicillin-clavulanate (AUGMENTIN) 875-125 MG per tablet 1 tablet (not administered)     Initial Impression / Assessment and Plan / ED Course  I have reviewed the triage vital signs and the nursing notes.  Pertinent labs & imaging results that were available during my care of the patient were reviewed by me  and considered in my medical decision making (see chart for details).    4:11 PM Subglottic swelling has diminished. Patient states that she feels well, has no complaints, she actually states that she feels better, with no difficulty breathing, speaking, swallowing. She has no company by her daughter, who is aware of the patient's presentation today. This would be an unusual event for an allergic reaction, pathology is inconsistent with Ludwig's angina, and the patient has no ongoing posterior oropharyngeal discomfort. Some suspicion for infection given the patient's poor dentition, and the patient was started on antibiotics.  given the absence of any ongoing complaints, unremarkable vital signs, low suspicion for progressive allergic reaction or bacteremia, sepsis if indeed the etiology is infectious, the patient will be returned to her nursing facility. Return precautions discussed with the family.  Final Clinical Impressions(s) / ED Diagnoses  Tongue swelling   Carmin Muskrat, MD 05/07/17 [phone]

## 2017-05-07 NOTE — Discharge Instructions (Signed)
As discussed, today's evaluation has been generally reassuring, though the actual cause of the swelling is not definite, it is more consistent with an infection. Please monitor her condition carefully, and do not hesitate to return for concerning changes.  Otherwise, take all medication as directed and be sure to follow-up with your physician.

## 2019-04-20 ENCOUNTER — Other Ambulatory Visit: Payer: Self-pay

## 2019-04-20 ENCOUNTER — Encounter (HOSPITAL_COMMUNITY): Payer: Self-pay | Admitting: Pharmacy Technician

## 2019-04-20 ENCOUNTER — Emergency Department (HOSPITAL_COMMUNITY): Payer: Medicare Other

## 2019-04-20 ENCOUNTER — Emergency Department (HOSPITAL_COMMUNITY)
Admission: EM | Admit: 2019-04-20 | Discharge: 2019-04-20 | Disposition: A | Discharge status: Home / Self Care | Payer: Medicare Other | Attending: Emergency Medicine | Admitting: Emergency Medicine

## 2019-04-20 DIAGNOSIS — F0390 Unspecified dementia without behavioral disturbance: Secondary | ICD-10-CM | POA: Diagnosis not present

## 2019-04-20 DIAGNOSIS — N6310 Unspecified lump in the right breast, unspecified quadrant: Secondary | ICD-10-CM | POA: Insufficient documentation

## 2019-04-20 DIAGNOSIS — R0789 Other chest pain: Secondary | ICD-10-CM

## 2019-04-20 DIAGNOSIS — Z79899 Other long term (current) drug therapy: Secondary | ICD-10-CM | POA: Diagnosis not present

## 2019-04-20 LAB — BASIC METABOLIC PANEL
Anion gap: 11 (ref 5–15)
BUN: 10 mg/dL (ref 8–23)
CO2: 25 mmol/L (ref 22–32)
Calcium: 9.2 mg/dL (ref 8.9–10.3)
Chloride: 104 mmol/L (ref 98–111)
Creatinine, Ser: 0.74 mg/dL (ref 0.44–1.00)
GFR calc Af Amer: >60 mL/min (ref >60)
GFR calc non Af Amer: >60 mL/min (ref >60)
Glucose, Bld: 102 mg/dL — ABNORMAL HIGH (ref 70–99)
Potassium: 3.8 mmol/L (ref 3.5–5.1)
Sodium: 140 mmol/L (ref 135–145)

## 2019-04-20 LAB — CBC
HCT: 45.8 % (ref 36.0–46.0)
Hemoglobin: 15.4 g/dL — ABNORMAL HIGH (ref 12.0–15.0)
MCH: 32.7 pg (ref 26.0–34.0)
MCHC: 33.6 g/dL (ref 30.0–36.0)
MCV: 97.2 fL (ref 80.0–100.0)
Platelets: 235 10*3/uL (ref 150–400)
RBC: 4.71 MIL/uL (ref 3.87–5.11)
RDW: 11.5 % (ref 11.5–15.5)
WBC: 7.3 10*3/uL (ref 4.0–10.5)
nRBC: 0 % (ref 0.0–0.2)

## 2019-04-20 LAB — TROPONIN I (HIGH SENSITIVITY)
Troponin I (High Sensitivity): 3 ng/L (ref <18)
Troponin I (High Sensitivity): 3 ng/L (ref <18)

## 2019-04-20 MED ORDER — IOHEXOL 350 MG/ML SOLN
80.0000 mL | Freq: Once | INTRAVENOUS | Status: AC | PRN
  Administered 2019-04-20: 18:00:00 59 mL via INTRAVENOUS

## 2019-04-20 MED ORDER — SODIUM CHLORIDE 0.9% FLUSH
3.0000 mL | Freq: Once | INTRAVENOUS | Status: AC
  Administered 2019-04-20: 3 mL via INTRAVENOUS

## 2019-04-20 NOTE — ED Provider Notes (Signed)
Becky [name]   CSN: [id number] Arrival date & time: 04/20/19  1339     History Chief Complaint  Patient presents with  . Chest Pain    Becky [name] is a 86 y.o. female.  Pt presents to the ED today with cp.  Pt has a hx of dementia and is a poor historian.  EMS was called out to the SNF due to right sided cp.  Pt unable to describe the CP and does not have any complaints now.  Pt was given an asa pta by SNF.        Past Medical History:  Diagnosis Date  . Arthritis   . Dementia ([place])   . Depression    and anxiety  . Fibromyalgia   . Fuchs' corneal dystrophy    L eye  . Glaucoma    R eye  . Hyperlipidemia   . Migraines   . Osteoporosis   . Polio    PPS  (age 49 yr)  . Ulcer    small- stomach    Patient Active Problem List   Diagnosis Date Noted  . Osteoporosis 05/03/2015  . MDD (major depressive disorder) 05/03/2015  . Glaucoma 05/03/2015  . Fuchs' corneal dystrophy 05/03/2015  . Dementia arising in the senium and presenium ([place]) 05/25/2014  . 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 inguinal herniorrhaphy    . TUBAL LIGATION       OB History   No obstetric history on file.     Family History  Problem Relation Age of Onset  . Arthritis Brother   . Cancer Brother   . Alcohol abuse Other        addiction   . Arthritis Other   . Colon cancer Other        1st degree relative <60  . Diabetes Other        1st degree relative  . Psychosis Other     Social History   Tobacco Use  . Smoking status: Never Smoker  . Smokeless tobacco: Never Used  Substance Use Topics  . Alcohol use: No  . Drug use: No    Home Medications Prior to Admission medications   Medication Sig Start Date End Date Taking? Authorizing Provider  acetaminophen (MAPAP) 500 MG tablet Take 500 mg by mouth every 6 (six) hours as needed (for pain).    [provider]  amoxicillin-clavulanate (AUGMENTIN) 875-125 MG tablet Take 1 tablet by mouth every 12 (twelve) hours. 05/07/17   Carmin Muskrat, MD  aspirin 81 MG chewable tablet Chew 81 mg by mouth daily with breakfast.    [provider]  brimonidine (ALPHAGAN) 0.2 % ophthalmic solution Place 1 drop into the right eye 2 (two) times daily.    [provider]  buPROPion (WELLBUTRIN XL) 150 MG 24 hr tablet Take 150 mg by mouth daily after breakfast.     [provider]  Cholecalciferol (VITAMIN D3) 5000 UNITS CAPS Take 10,000 Units by mouth every Tuesday.     [provider]  clonazePAM (KLONOPIN) 0.5 MG tablet Take 0.5 tablets (0.25 mg total) by mouth 2 (two) times daily. 05/03/15   Lucretia Kern, DO  donepezil (ARICEPT) 10 MG tablet TAKE 1 TABLET (10 MG TOTAL) BY MOUTH DAILY. Patient not taking: Reported on 05/07/2017 06/16/14   Dohmeier, Asencion Partridge, MD  ibandronate (BONIVA) 150 MG tablet Take 1 tablet (150 mg total)  by mouth every 30 (thirty) days. Take in the morning with a full glass of water, on an empty stomach, and do not take anything else by mouth or lie down for the next 30 min. Patient taking differently: Take 150 mg by mouth every 30 (thirty) days. Take in the morning with a full glass of water, on an empty stomach, and do not take anything else by mouth or lie down for the next 30 min. *Takes on the first Sunday of month* 08/05/13   Dutch Quint B, FNP  ipratropium (ATROVENT) 0.06 % nasal spray Place 2 sprays into both nostrils daily as needed (for clearing her throat due to post nasal drip).    [provider]  Memantine HCl ER (NAMENDA XR) 28 MG CP24 Take 28 mg by mouth daily. Patient not taking: Reported on 05/07/2017 12/16/13   Dennie Bible, NP  Memantine HCl-Donepezil HCl [facility]) 28-10 MG CP24 Take 1 capsule by mouth daily with breakfast.    [provider]  Polyethyl Glycol-Propyl Glycol (SYSTANE) 0.4-0.3 % GEL ophthalmic gel  Place 1 application into the left eye as needed (for fuchs' dystrophy).    [provider]  Polyvinyl Alcohol (LIQUITEARS OP) Place 1 drop into both eyes every 4 (four) hours while awake.    [provider]  sertraline (ZOLOFT) 100 MG tablet Take 150 mg by mouth daily after breakfast.     [provider]  simvastatin (ZOCOR) 10 MG tablet Take 1 tablet (10 mg total) by mouth daily at 6 PM. Patient taking differently: Take 10 mg by mouth at bedtime.  04/23/14   Dutch Quint B, FNP  sodium chloride (ALTACHLORE) 5 % ophthalmic ointment Place 1 application into the left eye 4 (four) times daily.    [provider]  Travoprost, BAK Free, (TRAVATAN) 0.004 % SOLN ophthalmic solution Place 1 drop into the right eye at bedtime.    [provider]  UNABLE TO FIND MED PASS-4 OZ TWICE DAILY FOR NUTRITIONAL SUPPORT    [provider]    Allergies    Patient has no known allergies.  Review of Systems   Review of Systems  Cardiovascular: Positive for chest pain.  All other systems reviewed and are negative.   Physical Exam Updated Vital Signs BP 101/71   Pulse 82   Temp 98.3 F (36.8 C) (Oral)   Resp 17   SpO2 94%   Physical Exam Vitals and nursing [name] reviewed.  Constitutional:      Appearance: She is well-developed.  HENT:     Head: Normocephalic and atraumatic.  Eyes:     Extraocular Movements: Extraocular movements intact.     Pupils: Pupils are equal, round, and reactive to light.  Cardiovascular:     Rate and Rhythm: Normal rate and regular rhythm.     Heart sounds: Normal heart sounds.  Pulmonary:     Effort: Pulmonary effort is normal.     Breath sounds: Normal breath sounds.  Abdominal:     General: Bowel sounds are normal.     Palpations: Abdomen is soft.  Musculoskeletal:        General: Normal range of motion.     Cervical back: Normal range of motion and neck supple.  Skin:    General: Skin is warm.     Capillary  Refill: Capillary refill takes less than 2 seconds.  Neurological:     General: No focal deficit present.     Mental Status: She is alert.  Mental status is at baseline. She is disoriented.  Psychiatric:        Mood and Affect: Mood normal.        Behavior: Behavior normal.     ED Results / Procedures / Treatments   Labs (all labs ordered are listed, but only abnormal results are displayed) Labs Reviewed  BASIC METABOLIC PANEL - Abnormal; Notable for the following components:      Result Value   Glucose, Bld 102 (*)    All other components within normal limits  CBC - Abnormal; Notable for the following components:   Hemoglobin 15.4 (*)    All other components within normal limits  URINALYSIS, ROUTINE W REFLEX MICROSCOPIC  TROPONIN I (HIGH SENSITIVITY)  TROPONIN I (HIGH SENSITIVITY)    EKG EKG Interpretation  Date/Time:  Sunday April 20 2019 13:56:05 EST Ventricular Rate:  91 PR Interval:    QRS Duration: 141 QT Interval:  425 QTC Calculation: 523 R Axis:   -66 Text Interpretation: Sinus rhythm Prolonged PR interval Left bundle branch block pvs noted in II and III Confirmed by Pattricia Boss [phone]) on 04/20/2019 2:29:35 PM   Radiology DG Chest 2 View  Result Date: 04/20/2019 CLINICAL DATA:  Chest pain EXAM: CHEST - 2 VIEW COMPARISON:  Chest radiograph dated 05/14/2013 FINDINGS: The heart is normal in size. Vascular calcifications are seen in the aortic arch. The lungs are hyperinflated. No focal consolidation, pleural effusion, or pneumothorax is identified. IMPRESSION: No acute cardiopulmonary disease. Electronically Signed   By: Zerita Boers M.D.   On: 04/20/2019 15:01   CT Angio Chest PE W and/or Wo Contrast  Result Date: 04/20/2019 CLINICAL DATA:  Shortness of breath and chest pain EXAM: CT ANGIOGRAPHY CHEST WITH CONTRAST TECHNIQUE: Multidetector CT imaging of the chest was performed using the standard protocol during bolus administration of intravenous contrast.  Multiplanar CT image reconstructions and MIPs were obtained to evaluate the vascular anatomy. CONTRAST:  95mL OMNIPAQUE IOHEXOL 350 MG/ML SOLN COMPARISON:  Chest radiograph performed the same day. FINDINGS: Cardiovascular: Satisfactory opacification of the pulmonary arteries to the segmental level. No evidence of pulmonary embolism. Vascular calcifications are seen in the coronary arteries and aortic arch. The ascending aorta measures 3.4 cm in diameter. The heart is mildly enlarged. No pericardial effusion. Mediastinum/Nodes: No enlarged mediastinal, hilar, or axillary lymph nodes. Thyroid gland, trachea, and esophagus demonstrate no significant findings. A 2.2 cm mass is seen in the medial aspect of the right breast. Lungs/Pleura: There is mild-to-moderate bilateral lower lobe atelectasis. There is no pleural effusion or pneumothorax. Upper Abdomen: There is a moderate hiatal hernia. A gallstone is seen in the gallbladder. Musculoskeletal: No chest wall abnormality. Degenerative changes are seen in the spine. Review of the MIP images confirms the above findings. IMPRESSION: 1. No evidence of pulmonary embolism. 2. Mild-to-moderate bilateral lower lobe atelectasis. 3. A 2.2 cm mass is seen in the medial aspect of the right breast. Recommend non emergent diagnostic mammogram and ultrasound for further evaluation. Aortic Atherosclerosis (ICD10-I70.0). Electronically Signed   By: Zerita Boers M.D.   On: 04/20/2019 18:57    Procedures Procedures (including critical care time)  Medications Ordered in ED Medications  sodium chloride flush (NS) 0.9 % injection 3 mL (3 mLs Intravenous Given 04/20/19 1734)  iohexol (OMNIPAQUE) 350 MG/ML injection 80 mL (59 mLs Intravenous Contrast Given 04/20/19 1819)    ED Course  I have reviewed the triage vital signs and the nursing notes.  Pertinent labs & imaging results that were  available during my care of the patient were reviewed by me and considered in my medical  decision making (see chart for details).    MDM Rules/Calculators/A&P                      I spoke with pt's son regarding the breast mass.  He thinks he's been told this and they have decided not to work it up due to age.   He will talk with his wife and sister and make sure.  He knows that if they decide to work it up, she will need a mammogram.     Pt is able to ambulate with O2 sats staying in the mid-90s.  Pt's cardiac work up is unremarkable.  She is stable for d/c.    Final Clinical Impression(s) / ED Diagnoses Final diagnoses:  Atypical chest pain  Breast mass, right    Rx / DC Orders ED Discharge Orders    None       Isla Pence, MD 04/20/19 [phone]

## 2019-04-20 NOTE — ED Notes (Signed)
Pt ambulated in hallway. o2 Sat stayed above 95% on RA.

## 2019-04-20 NOTE — Discharge Instructions (Signed)
A breast mass was seen on CT of the chest.  It is possibly breast cancer.  If you want this to be evaluated further, she will need a mammogram and to see ObGyn.

## 2019-04-20 NOTE — ED Notes (Signed)
Pt refused Ambulance transport to [facility]. RN called pt mother, who is coming to the ER to pick her up. RN called [employer] and allowed Staff to know of the plan.

## 2019-04-20 NOTE — ED Provider Notes (Signed)
MSE was initiated and I personally evaluated the patient and placed orders (if any) at  2:32 PM on April 20, 2019.  The patient appears stable so that the remainder of the MSE may be completed by another provider. 86 year old female presents today complaining of chest pain that occurred this morning.  Patient is unable to tell me how long it lasted.  She is currently not having chest pain. EKG reviewed and left bundle branch noted. Vitals:   04/20/19 1350  BP: 126/79  Pulse: 85  Resp: 16  Temp: 98.3 F (36.8 C)  SpO2: 95%      Pattricia Boss, MD 04/20/19 1432

## 2019-04-20 NOTE — ED Notes (Signed)
Patient verbalizes understanding of discharge instructions. Opportunity for questioning and answers were provided. Armband removed by staff, pt discharged from ED. Pt. ambulatory and discharged home.  

## 2019-04-20 NOTE — ED Notes (Signed)
Pt noted to be hypoxic at 89-90% with a good pleth. MD made aware.

## 2019-04-20 NOTE — ED Triage Notes (Signed)
Pt bib ems from [facility] with sudden onset R side CP. Non radiating, no NV shob. Pt was pain free before EMS arrived on scene. Nursing facility administered 324mg  ASA prior to EMS arrival. Pt has been pain free for ems.  130/80 HR 80 LBBB 95% RA

## 2019-06-02 ENCOUNTER — Other Ambulatory Visit: Payer: Self-pay | Admitting: Radiology

## 2019-06-05 ENCOUNTER — Encounter: Payer: Self-pay | Admitting: *Deleted

## 2019-06-05 DIAGNOSIS — Z170 Estrogen receptor positive status [ER+]: Secondary | ICD-10-CM | POA: Insufficient documentation

## 2019-06-05 DIAGNOSIS — C50411 Malignant neoplasm of upper-outer quadrant of right female breast: Secondary | ICD-10-CM
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
## 2019-06-11 ENCOUNTER — Ambulatory Visit
Admission: RE | Admit: 2019-06-11 | Discharge: 2019-06-11 | Disposition: A | Discharge status: Home / Self Care | Payer: Medicare Other | Source: Ambulatory Visit | Attending: Radiation Oncology | Admitting: Radiation Oncology

## 2019-06-11 ENCOUNTER — Other Ambulatory Visit: Payer: Self-pay

## 2019-06-11 ENCOUNTER — Other Ambulatory Visit: Payer: Self-pay | Admitting: *Deleted

## 2019-06-11 ENCOUNTER — Inpatient Hospital Stay: Payer: Medicare Other | Attending: Oncology | Admitting: Oncology

## 2019-06-11 ENCOUNTER — Encounter: Payer: Self-pay | Admitting: *Deleted

## 2019-06-11 ENCOUNTER — Ambulatory Visit: Payer: Self-pay | Admitting: Surgery

## 2019-06-11 ENCOUNTER — Inpatient Hospital Stay: Payer: Medicare Other

## 2019-06-11 VITALS — BP 144/72 | HR 89 | Temp 98.7°F | Resp 18 | Ht 60.05 in | Wt 130.2 lb

## 2019-06-11 DIAGNOSIS — Z170 Estrogen receptor positive status [ER+]: Secondary | ICD-10-CM | POA: Diagnosis not present

## 2019-06-11 DIAGNOSIS — F419 Anxiety disorder, unspecified: Secondary | ICD-10-CM | POA: Diagnosis not present

## 2019-06-11 DIAGNOSIS — M818 Other osteoporosis without current pathological fracture: Secondary | ICD-10-CM | POA: Insufficient documentation

## 2019-06-11 DIAGNOSIS — C50411 Malignant neoplasm of upper-outer quadrant of right female breast: Secondary | ICD-10-CM

## 2019-06-11 DIAGNOSIS — F329 Major depressive disorder, single episode, unspecified: Secondary | ICD-10-CM | POA: Insufficient documentation

## 2019-06-11 DIAGNOSIS — Z79899 Other long term (current) drug therapy: Secondary | ICD-10-CM | POA: Insufficient documentation

## 2019-06-11 DIAGNOSIS — J449 Chronic obstructive pulmonary disease, unspecified: Secondary | ICD-10-CM | POA: Insufficient documentation

## 2019-06-11 DIAGNOSIS — C50911 Malignant neoplasm of unspecified site of right female breast: Secondary | ICD-10-CM

## 2019-06-11 LAB — CMP (CANCER CENTER ONLY)
ALT: 14 U/L (ref 0–44)
AST: 16 U/L (ref 15–41)
Albumin: 3.6 g/dL (ref 3.5–5.0)
Alkaline Phosphatase: 68 U/L (ref 38–126)
Anion gap: 8 (ref 5–15)
BUN: 15 mg/dL (ref 8–23)
CO2: 25 mmol/L (ref 22–32)
Calcium: 9.1 mg/dL (ref 8.9–10.3)
Chloride: 107 mmol/L (ref 98–111)
Creatinine: 0.71 mg/dL (ref 0.44–1.00)
GFR, Est AFR Am: >60 mL/min (ref >60)
GFR, Estimated: >60 mL/min (ref >60)
Glucose, Bld: 116 mg/dL — ABNORMAL HIGH (ref 70–99)
Potassium: 3.9 mmol/L (ref 3.5–5.1)
Sodium: 140 mmol/L (ref 135–145)
Total Bilirubin: 1.1 mg/dL (ref 0.3–1.2)
Total Protein: 7 g/dL (ref 6.5–8.1)

## 2019-06-11 LAB — CBC WITH DIFFERENTIAL (CANCER CENTER ONLY)
Abs Immature Granulocytes: 0.02 10*3/uL (ref 0.00–0.07)
Basophils Absolute: 0.1 10*3/uL (ref 0.0–0.1)
Basophils Relative: 1 %
Eosinophils Absolute: 0.2 10*3/uL (ref 0.0–0.5)
Eosinophils Relative: 3 %
HCT: 44.6 % (ref 36.0–46.0)
Hemoglobin: 15.1 g/dL — ABNORMAL HIGH (ref 12.0–15.0)
Immature Granulocytes: 0 %
Lymphocytes Relative: 24 %
Lymphs Abs: 1.4 10*3/uL (ref 0.7–4.0)
MCH: 31.9 pg (ref 26.0–34.0)
MCHC: 33.9 g/dL (ref 30.0–36.0)
MCV: 94.3 fL (ref 80.0–100.0)
Monocytes Absolute: 0.4 10*3/uL (ref 0.1–1.0)
Monocytes Relative: 7 %
Neutro Abs: 3.9 10*3/uL (ref 1.7–7.7)
Neutrophils Relative %: 65 %
Platelet Count: 211 10*3/uL (ref 150–400)
RBC: 4.73 MIL/uL (ref 3.87–5.11)
RDW: 11.6 % (ref 11.5–15.5)
WBC Count: 5.9 10*3/uL (ref 4.0–10.5)
nRBC: 0 % (ref 0.0–0.2)

## 2019-06-11 LAB — GENETIC SCREENING ORDER
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
## 2019-06-11 NOTE — H&P (Signed)
[name] Documented: 06/11/2019 8:17 AM Location: [facility] Patient #: [id number] DOB: [birth year] Undefined / Language: Becky [name] / Race: White Female  History of Present Illness Becky Moores A. Marlo Arriola MD; 06/11/2019 11:19 AM) Patient words: Pt sent at the request of Dr Sondra Come for a right breast mass detected on CT. 3 CM CORE BX IDC ER / PR POS HER 2 NEU - She has low grade dementia but is functional and cognition is good Daughter is present. Denies pain but had a skin graft right breast since childhood.  The patient is a 86 year old female.   Medication History Conni Slipper, RN; 06/11/2019 8:17 AM) Medications Reconciled     Review of Systems Becky Moores A. Kanitra Purifoy MD; 06/11/2019 11:24 AM) All other systems negative   Physical Exam (Kaylene Dawn A. Kaelem Brach MD; 06/11/2019 11:21 AM)  General Mental Status-Alert. General Appearance-Consistent with stated age. Hydration-Well hydrated. Voice-Normal.  Eye Eyeball - Bilateral-Extraocular movements intact. Sclera/Conjunctiva - Bilateral-No scleral icterus.  Breast Note: 3 cm right breast mass no nipple STSG left breast normal  Cardiovascular Cardiovascular examination reveals -normal heart sounds, regular rate and rhythm with no murmurs and normal pedal pulses bilaterally.  Neurologic Neurologic evaluation reveals -alert and oriented x 3 with no impairment of recent or remote memory. Mental Status-Normal.  Musculoskeletal Normal Exam - Left-Upper Extremity Strength Normal and Lower Extremity Strength Normal. Normal Exam - Right-Upper Extremity Strength Normal and Lower Extremity Strength Normal.  Lymphatic Head & Neck  General Head & Neck Lymphatics: Bilateral - Description - Normal. Axillary  General Axillary Region: Bilateral - Description - Normal. Tenderness - Non Tender.    Assessment & Plan (Makalya Nave A. Taivon Haroon MD; 06/11/2019 11:24 AM)  BREAST CANCER, RIGHT (C50.911) Impression: she  is fit for right breast seed lumpectomy best option for local control Risk of lumpectomy include bleeding, infection, seroma, more surgery, use of seed/wire, wound care, cosmetic deformity and the need for other treatments, death , blood clots, death. Pt agrees to proceed  45 min for counseling, face to face exam discussion of surgery and complications and non operative options.  Current Plans Pt Education - [facility] Free Text Education/Instructions: discussed with patient and provided information. Pt Education - [facility] Breast Biopsy HCI: discussed with patient and provided information.

## 2019-06-12 ENCOUNTER — Telehealth: Payer: Self-pay | Admitting: *Deleted

## 2019-06-12 ENCOUNTER — Encounter: Payer: Self-pay | Admitting: *Deleted

## 2019-06-12 ENCOUNTER — Telehealth: Payer: Self-pay | Admitting: Oncology

## 2019-06-12 NOTE — Telephone Encounter (Signed)
Spoke to pt daughter Lyla regarding [place] from 4.14.21. Denies questions or concerns regarding dx or treatment care plan. Pt daughter concerned about her mother having xrt. Informed daughter that xrt will be determined after final pathology is review by Dr.Kinard. Received verbal understanding. Encourage pt and daughter to call with needs. Contact information provided.

## 2019-06-12 NOTE — Telephone Encounter (Signed)
Scheduled appt per 4/14 los. Pt's daughter confirmed appt date and time.

## 2019-06-18 ENCOUNTER — Encounter: Payer: Self-pay | Admitting: *Deleted

## 2019-06-19 ENCOUNTER — Encounter: Payer: Self-pay | Admitting: *Deleted

## 2019-06-19 DIAGNOSIS — Z170 Estrogen receptor positive status [ER+]: Secondary | ICD-10-CM

## 2019-06-19 DIAGNOSIS — C50411 Malignant neoplasm of upper-outer quadrant of right female breast: Secondary | ICD-10-CM

## 2019-06-19 NOTE — Progress Notes (Signed)
Spoke with patient's daughter [name] to follow up from [facility] and to assess navigation needs.  Patient has dementia.  Confirmed appointments and answered her questions.  Encouraged her to call if anything else arises

## 2019-06-25 ENCOUNTER — Telehealth: Payer: Self-pay

## 2019-06-25 NOTE — Telephone Encounter (Signed)
Nutrition Assessment  Reason for Assessment:  Pt attended [facility] Clinic on 4/14 and was given nutrition packet by nurse navigator.   ASSESSMENT:  86 year old female with new diagnosis of breast cancer.  Patient lives at [place] home.  Past medical history reviewed.  Planning surgery, possible radiation and antiestrogens.   Spoke with daughter Becky [name] via phone.  She reports patient's appetite is fair.    Medications:  reviewed  Labs: reviewed  Anthropometrics:   Height: 65 inches Weight: 130 lb BMI: 25   NUTRITION DIAGNOSIS: Food and nutrition related knowledge deficit related to new diagnosis of breast cancer as evidenced by no prior need for nutrition related information.  INTERVENTION:   Discussed briefly packet of information regarding nutritional tips for breast cancer patients.  Questions answered.  Teachback method used.  Contact information provided and patient knows to contact me with questions/concerns.    MONITORING, EVALUATION, and GOAL: Pt will consume a healthy plant based diet to maintain lean body mass throughout treatment.   Becky [name], [place], [place] Registered Dietitian [phone] (pager)

## 2019-07-09 ENCOUNTER — Encounter: Payer: Self-pay | Admitting: *Deleted

## 2019-07-10 NOTE — Progress Notes (Signed)
[place], [place] [address] Phone: [phone] Fax: [phone]      Your procedure is scheduled on Jul 15, 2019.  Report to [facility] Main Entrance "A" at 5:30A.M., and check in at the Admitting office.  Call this number if you have problems the morning of surgery:  [phone]  Call [phone] if you have any questions prior to your surgery date Monday-Friday 8am-4pm    Remember:  Do not eat after midnight the night before your surgery  You may drink clear liquids until 4:30 the morning of your surgery.   Clear liquids allowed are: Water, Non-Citrus Juices (without pulp), Carbonated Beverages, Clear Tea, Black Coffee Only, and Gatorade    Take these medicines the morning of surgery with A SIP OF WATER: Memantine HCl-Donepezil HCl (NAMZARIC) Polyvinyl Alcohol-Povidone (REFRESH OP) - eye drops sertraline (ZOLOFT)  sodium chloride (ALTACHLORE) - eye drop to left eye  acetaminophen (Tylenol) - as needed for pain  As of today, STOP taking any Aspirin (unless otherwise instructed by your surgeon) and Aspirin containing products, Aleve, Naproxen, Ibuprofen, Motrin, Advil, Goody's, BC's, all herbal medications, fish oil, and all vitamins.                      Do not wear jewelry, make up, or nail polish            Do not wear lotions, powders, perfumes or deodorant.            Do not shave 48 hours prior to surgery.              Do not bring valuables to the hospital.            [facility] is not responsible for any belongings or valuables.  Do NOT Smoke (Tobacco/Vapping) or drink Alcohol 24 hours prior to your procedure If you use a CPAP at night, you may bring all equipment for your overnight stay.   Contacts, glasses, dentures or bridgework may not be worn into surgery.      For patients admitted to the hospital, discharge time will be determined by your treatment  team.   Patients discharged the day of surgery will not be allowed to drive home, and someone needs to stay with them for 24 hours.    Special instructions:   [place]- Preparing For Surgery  Before surgery, you can play an important role. Because skin is not sterile, your skin needs to be as free of germs as possible. You can reduce the number of germs on your skin by washing with CHG (chlorahexidine gluconate) Soap before surgery.  CHG is an antiseptic cleaner which kills germs and bonds with the skin to continue killing germs even after washing.    Oral Hygiene is also important to reduce your risk of infection.  Remember - BRUSH YOUR TEETH THE MORNING OF SURGERY WITH YOUR REGULAR TOOTHPASTE  Please do not use if you have an allergy to CHG or antibacterial soaps. If your skin becomes reddened/irritated stop using the CHG.  Do not shave (including legs and underarms) for at least 48 hours prior to first CHG shower. It is OK to shave your face.  Please follow these instructions carefully.   1. Shower the NIGHT BEFORE SURGERY and the MORNING OF SURGERY with CHG Soap.   2. If you chose to wash your hair, wash your hair first as usual  with your normal shampoo.  3. After you shampoo, rinse your hair and body thoroughly to remove the shampoo.  4. Use CHG as you would any other liquid soap. You can apply CHG directly to the skin and wash gently with a scrungie or a clean washcloth.   5. Apply the CHG Soap to your body ONLY FROM THE NECK DOWN.  Do not use on open wounds or open sores. Avoid contact with your eyes, ears, mouth and genitals (private parts). Wash Face and genitals (private parts)  with your normal soap.   6. Wash thoroughly, paying special attention to the area where your surgery will be performed.  7. Thoroughly rinse your body with warm water from the neck down.  8. DO NOT shower/wash with your normal soap after using and rinsing off the CHG Soap.  9. Pat yourself dry  with a CLEAN TOWEL.  10. Wear CLEAN PAJAMAS to bed the night before surgery, wear comfortable clothes the morning of surgery  11. Place CLEAN SHEETS on your bed the night of your first shower and DO NOT SLEEP WITH PETS.   Day of Surgery:   Do not apply any deodorants/lotions.  Please wear clean clothes to the hospital/surgery center.   Remember to brush your teeth WITH YOUR REGULAR TOOTHPASTE.   Please read over the following fact sheets that you were given.

## 2019-07-11 ENCOUNTER — Encounter (HOSPITAL_COMMUNITY): Payer: Self-pay

## 2019-07-11 ENCOUNTER — Inpatient Hospital Stay (HOSPITAL_COMMUNITY): Admission: RE | Admit: 2019-07-11 | Payer: Medicare Other | Source: Ambulatory Visit

## 2019-07-11 ENCOUNTER — Other Ambulatory Visit: Payer: Self-pay

## 2019-07-11 ENCOUNTER — Encounter (HOSPITAL_COMMUNITY)
Admission: RE | Admit: 2019-07-11 | Discharge: 2019-07-11 | Disposition: A | Discharge status: Home / Self Care | Payer: Medicare Other | Source: Ambulatory Visit | Attending: Surgery | Admitting: Surgery

## 2019-07-11 DIAGNOSIS — Z01812 Encounter for preprocedural laboratory examination: Secondary | ICD-10-CM | POA: Insufficient documentation

## 2019-07-11 LAB — CBC WITH DIFFERENTIAL/PLATELET
Abs Immature Granulocytes: 0.02 10*3/uL (ref 0.00–0.07)
Basophils Absolute: 0.1 10*3/uL (ref 0.0–0.1)
Basophils Relative: 1 %
Eosinophils Absolute: 0.2 10*3/uL (ref 0.0–0.5)
Eosinophils Relative: 3 %
HCT: 46.3 % — ABNORMAL HIGH (ref 36.0–46.0)
Hemoglobin: 15.3 g/dL — ABNORMAL HIGH (ref 12.0–15.0)
Immature Granulocytes: 0 %
Lymphocytes Relative: 24 %
Lymphs Abs: 1.5 10*3/uL (ref 0.7–4.0)
MCH: 32.2 pg (ref 26.0–34.0)
MCHC: 33 g/dL (ref 30.0–36.0)
MCV: 97.5 fL (ref 80.0–100.0)
Monocytes Absolute: 0.6 10*3/uL (ref 0.1–1.0)
Monocytes Relative: 9 %
Neutro Abs: 3.9 10*3/uL (ref 1.7–7.7)
Neutrophils Relative %: 63 %
Platelets: 235 10*3/uL (ref 150–400)
RBC: 4.75 MIL/uL (ref 3.87–5.11)
RDW: 11.9 % (ref 11.5–15.5)
WBC: 6.2 10*3/uL (ref 4.0–10.5)
nRBC: 0 % (ref 0.0–0.2)

## 2019-07-11 LAB — COMPREHENSIVE METABOLIC PANEL
ALT: 16 U/L (ref 0–44)
AST: 21 U/L (ref 15–41)
Albumin: 3.4 g/dL — ABNORMAL LOW (ref 3.5–5.0)
Alkaline Phosphatase: 55 U/L (ref 38–126)
Anion gap: 10 (ref 5–15)
BUN: 10 mg/dL (ref 8–23)
CO2: 24 mmol/L (ref 22–32)
Calcium: 9.3 mg/dL (ref 8.9–10.3)
Chloride: 106 mmol/L (ref 98–111)
Creatinine, Ser: 0.64 mg/dL (ref 0.44–1.00)
GFR calc Af Amer: >60 mL/min (ref >60)
GFR calc non Af Amer: >60 mL/min (ref >60)
Glucose, Bld: 144 mg/dL — ABNORMAL HIGH (ref 70–99)
Potassium: 4 mmol/L (ref 3.5–5.1)
Sodium: 140 mmol/L (ref 135–145)
Total Bilirubin: 1 mg/dL (ref 0.3–1.2)
Total Protein: 6.6 g/dL (ref 6.5–8.1)

## 2019-07-11 NOTE — Progress Notes (Signed)
PCP:  Leonard Downing, MD Cardiologist:  Denies  EKG:  04/20/19 CXR:  04/20/19 ECHO:  Denies Stress Test:  Denies Cardiac Cath:  Denies  covid test 07/11/19  Patient denies shortness of breath, fever, cough, and chest pain at PAT appointment.  Patient verbalized understanding of instructions provided today at the PAT appointment.  Patient asked to review instructions at home and day of surgery.

## 2019-07-11 NOTE — Progress Notes (Signed)
Patient is unable to quarantine so the patient will need a RAPID COVID test DOS

## 2019-07-15 ENCOUNTER — Ambulatory Visit (HOSPITAL_COMMUNITY): Payer: Medicare Other | Admitting: Anesthesiology

## 2019-07-15 ENCOUNTER — Encounter (HOSPITAL_COMMUNITY): Payer: Self-pay | Admitting: Surgery

## 2019-07-15 ENCOUNTER — Encounter (HOSPITAL_COMMUNITY)
Admission: RE | Disposition: A | Discharge status: Skilled Nursing Facility | Payer: Self-pay | Source: Home / Self Care | Attending: Surgery

## 2019-07-15 ENCOUNTER — Other Ambulatory Visit: Payer: Self-pay

## 2019-07-15 ENCOUNTER — Ambulatory Visit (HOSPITAL_COMMUNITY)
Admission: RE | Admit: 2019-07-15 | Discharge: 2019-07-15 | Disposition: A | Discharge status: Skilled Nursing Facility | Payer: Medicare Other | Attending: Surgery | Admitting: Surgery

## 2019-07-15 DIAGNOSIS — C50911 Malignant neoplasm of unspecified site of right female breast: Secondary | ICD-10-CM

## 2019-07-15 DIAGNOSIS — C50411 Malignant neoplasm of upper-outer quadrant of right female breast: Secondary | ICD-10-CM | POA: Diagnosis present

## 2019-07-15 DIAGNOSIS — F0390 Unspecified dementia without behavioral disturbance: Secondary | ICD-10-CM | POA: Insufficient documentation

## 2019-07-15 DIAGNOSIS — Z20822 Contact with and (suspected) exposure to covid-19: Secondary | ICD-10-CM | POA: Diagnosis not present

## 2019-07-15 HISTORY — PX: BREAST LUMPECTOMY WITH RADIOACTIVE SEED LOCALIZATION: SHX6424

## 2019-07-15 LAB — SARS CORONAVIRUS 2 BY RT PCR (HOSPITAL ORDER, PERFORMED IN ~~LOC~~ HOSPITAL LAB): SARS Coronavirus 2: NEGATIVE

## 2019-07-15 SURGERY — BREAST LUMPECTOMY WITH RADIOACTIVE SEED LOCALIZATION
Anesthesia: General | Site: Breast | Laterality: Right

## 2019-07-15 MED ORDER — ONDANSETRON HCL 4 MG/2ML IJ SOLN
INTRAMUSCULAR | Status: AC
  Filled 2019-07-15: qty 2

## 2019-07-15 MED ORDER — ACETAMINOPHEN 500 MG PO TABS
1000.0000 mg | ORAL_TABLET | ORAL | Status: AC
  Administered 2019-07-15: 1000 mg via ORAL
  Filled 2019-07-15: qty 2

## 2019-07-15 MED ORDER — STERILE WATER FOR IRRIGATION IR SOLN
Status: DC | PRN
  Administered 2019-07-15: 1000 mL

## 2019-07-15 MED ORDER — LACTATED RINGERS IV SOLN: INTRAVENOUS | Status: DC | PRN

## 2019-07-15 MED ORDER — LIDOCAINE 2% (20 MG/ML) 5 ML SYRINGE
INTRAMUSCULAR | Status: AC
  Filled 2019-07-15: qty 5

## 2019-07-15 MED ORDER — PROPOFOL 10 MG/ML IV BOLUS
INTRAVENOUS | Status: DC | PRN
  Administered 2019-07-15: 110 mg via INTRAVENOUS

## 2019-07-15 MED ORDER — FENTANYL CITRATE (PF) 100 MCG/2ML IJ SOLN
INTRAMUSCULAR | Status: DC | PRN
  Administered 2019-07-15: 50 ug via INTRAVENOUS

## 2019-07-15 MED ORDER — PROPOFOL 10 MG/ML IV BOLUS
INTRAVENOUS | Status: AC
  Filled 2019-07-15: qty 40

## 2019-07-15 MED ORDER — ONDANSETRON HCL 4 MG/2ML IJ SOLN: 4.0000 mg | Freq: Once | INTRAMUSCULAR | Status: DC | PRN

## 2019-07-15 MED ORDER — LIDOCAINE 2% (20 MG/ML) 5 ML SYRINGE
INTRAMUSCULAR | Status: DC | PRN
  Administered 2019-07-15: 60 mg via INTRAVENOUS

## 2019-07-15 MED ORDER — 0.9 % SODIUM CHLORIDE (POUR BTL) OPTIME
TOPICAL | Status: DC | PRN
  Administered 2019-07-15: 1000 mL

## 2019-07-15 MED ORDER — CEFAZOLIN SODIUM-DEXTROSE 2-4 GM/100ML-% IV SOLN
2.0000 g | INTRAVENOUS | Status: AC
  Administered 2019-07-15: 2 g via INTRAVENOUS
  Filled 2019-07-15: qty 100

## 2019-07-15 MED ORDER — DEXAMETHASONE SODIUM PHOSPHATE 4 MG/ML IJ SOLN
INTRAMUSCULAR | Status: DC | PRN
  Administered 2019-07-15: 4 mg via INTRAVENOUS

## 2019-07-15 MED ORDER — ONDANSETRON HCL 4 MG/2ML IJ SOLN
INTRAMUSCULAR | Status: DC | PRN
  Administered 2019-07-15: 4 mg via INTRAVENOUS

## 2019-07-15 MED ORDER — PHENYLEPHRINE 40 MCG/ML (10ML) SYRINGE FOR IV PUSH (FOR BLOOD PRESSURE SUPPORT)
PREFILLED_SYRINGE | INTRAVENOUS | Status: AC
  Filled 2019-07-15: qty 10

## 2019-07-15 MED ORDER — CHLORHEXIDINE GLUCONATE CLOTH 2 % EX PADS: 6.0000 | MEDICATED_PAD | Freq: Once | CUTANEOUS | Status: DC

## 2019-07-15 MED ORDER — FENTANYL CITRATE (PF) 250 MCG/5ML IJ SOLN
INTRAMUSCULAR | Status: AC
  Filled 2019-07-15: qty 5

## 2019-07-15 MED ORDER — PHENYLEPHRINE 40 MCG/ML (10ML) SYRINGE FOR IV PUSH (FOR BLOOD PRESSURE SUPPORT)
PREFILLED_SYRINGE | INTRAVENOUS | Status: DC | PRN
  Administered 2019-07-15: 120 ug via INTRAVENOUS
  Administered 2019-07-15: 80 ug via INTRAVENOUS

## 2019-07-15 MED ORDER — BUPIVACAINE HCL (PF) 0.25 % IJ SOLN
INTRAMUSCULAR | Status: AC
  Filled 2019-07-15: qty 30

## 2019-07-15 MED ORDER — BUPIVACAINE HCL 0.25 % IJ SOLN
INTRAMUSCULAR | Status: DC | PRN
  Administered 2019-07-15: 20 mL

## 2019-07-15 MED ORDER — FENTANYL CITRATE (PF) 100 MCG/2ML IJ SOLN: 25.0000 ug | INTRAMUSCULAR | Status: DC | PRN

## 2019-07-15 MED ORDER — DEXAMETHASONE SODIUM PHOSPHATE 10 MG/ML IJ SOLN
INTRAMUSCULAR | Status: AC
  Filled 2019-07-15: qty 1

## 2019-07-15 SURGICAL SUPPLY — 41 items
ADH SKN CLS APL DERMABOND .7 (GAUZE/BANDAGES/DRESSINGS) ×1
APL PRP STRL LF DISP 70% ISPRP (MISCELLANEOUS) ×1
APPLIER CLIP 9.375 MED OPEN (MISCELLANEOUS) ×3
APR CLP MED 9.3 20 MLT OPN (MISCELLANEOUS) ×1
BINDER BREAST LRG (GAUZE/BANDAGES/DRESSINGS) IMPLANT
BINDER BREAST XLRG (GAUZE/BANDAGES/DRESSINGS) ×2 IMPLANT
CANISTER SUCT 3000ML PPV (MISCELLANEOUS) IMPLANT
CHLORAPREP W/TINT 26 (MISCELLANEOUS) ×3 IMPLANT
CLIP APPLIE 9.375 MED OPEN (MISCELLANEOUS) IMPLANT
COVER PROBE W GEL 5X96 (DRAPES) ×3 IMPLANT
COVER SURGICAL LIGHT HANDLE (MISCELLANEOUS) ×3 IMPLANT
COVER WAND RF STERILE (DRAPES) ×3 IMPLANT
DERMABOND ADVANCED (GAUZE/BANDAGES/DRESSINGS) ×2
DERMABOND ADVANCED .7 DNX12 (GAUZE/BANDAGES/DRESSINGS) ×1 IMPLANT
DEVICE DUBIN SPECIMEN MAMMOGRA (MISCELLANEOUS) ×3 IMPLANT
DRAPE CHEST BREAST 15X10 FENES (DRAPES) ×3 IMPLANT
ELECT CAUTERY BLADE 6.4 (BLADE) ×3 IMPLANT
ELECT REM PT RETURN 9FT ADLT (ELECTROSURGICAL) ×3
ELECTRODE REM PT RTRN 9FT ADLT (ELECTROSURGICAL) ×1 IMPLANT
GLOVE BIO SURGEON STRL SZ8 (GLOVE) ×3 IMPLANT
GLOVE BIOGEL PI IND STRL 8 (GLOVE) ×1 IMPLANT
GLOVE BIOGEL PI INDICATOR 8 (GLOVE) ×2
GOWN STRL REUS W/ TWL LRG LVL3 (GOWN DISPOSABLE) ×1 IMPLANT
GOWN STRL REUS W/ TWL XL LVL3 (GOWN DISPOSABLE) ×1 IMPLANT
GOWN STRL REUS W/TWL LRG LVL3 (GOWN DISPOSABLE) ×3
GOWN STRL REUS W/TWL XL LVL3 (GOWN DISPOSABLE) ×3
KIT BASIN OR (CUSTOM PROCEDURE TRAY) ×3 IMPLANT
KIT MARKER MARGIN INK (KITS) IMPLANT
LIGHT WAVEGUIDE WIDE FLAT (MISCELLANEOUS) IMPLANT
NDL HYPO 25GX1X1/2 BEV (NEEDLE) IMPLANT
NEEDLE HYPO 25GX1X1/2 BEV (NEEDLE) IMPLANT
NS IRRIG 1000ML POUR BTL (IV SOLUTION) IMPLANT
PACK GENERAL/GYN (CUSTOM PROCEDURE TRAY) ×3 IMPLANT
SUT MNCRL AB 4-0 PS2 18 (SUTURE) ×3 IMPLANT
SUT SILK 2 0 SH (SUTURE) IMPLANT
SUT VIC AB 2-0 SH 27 (SUTURE) ×3
SUT VIC AB 2-0 SH 27XBRD (SUTURE) ×1 IMPLANT
SUT VIC AB 3-0 SH 27 (SUTURE) ×3
SUT VIC AB 3-0 SH 27X BRD (SUTURE) ×1 IMPLANT
SYR CONTROL 10ML LL (SYRINGE) IMPLANT
TOWEL GREEN STERILE FF (TOWEL DISPOSABLE) IMPLANT

## 2019-07-15 NOTE — Transfer of Care (Signed)
Immediate Anesthesia Transfer of Care Note  Patient: Becky [name]  Procedure(s) Performed: RADIOACTIVE SEED GUIDED RIGHT BREAST LUMPECTOMY (Right Breast)  Patient Location: PACU  Anesthesia Type:General  Level of Consciousness: awake and alert   Airway & Oxygen Therapy: Patient Spontanous Breathing and Patient connected to face mask oxygen  Post-op Assessment: Report given to RN and Post -op Vital signs reviewed and stable  Post vital signs: Reviewed and stable  Last Vitals:  Vitals Value Taken Time  BP 129/71 07/15/19 0836  Temp    Pulse 68 07/15/19 0838  Resp 13 07/15/19 0838  SpO2 98 % 07/15/19 0838  Vitals shown include unvalidated device data.  Last Pain:  Vitals:   07/15/19 0627  TempSrc: Oral  PainSc:          Complications: No apparent anesthesia complications

## 2019-07-15 NOTE — Op Note (Signed)
Preoperative diagnosis: Stage II right breast cancer upper outer quadrant  Postoperative diagnosis: Same  Procedure: Right breast seed localized lumpectomy  Surgeon: Erroll Luna, MD  Anesthesia: LMA with 0.25% Marcaine plain  EBL: Minimal  Specimen: Right breast tissue with seed and clip verified by Faxitron.  Of note the seed was removed separately.  6 additional margins were sent and oriented with ink  Drains: None  Indications for procedure: The patient is an 86 year old female with right breast cancer.  She was offered medical management versus lumpectomy for better local control.  She was functional at 37 and offered lumpectomy since this would be her best option of local control.  We discussed medical management as well her case.  Risks and benefits were discussed of surgery especially at advanced age.The procedure has been discussed with the patient. Alternatives to surgery have been discussed with the patient.  Risks of surgery include bleeding,  Infection,  Seroma formation, death,  and the need for further surgery.   The patient understands and wishes to proceed.   Description of procedure: The patient was met in the holding area and questions were answered.  Neoprobe used to verify seed location right breast upper outer quadrant.  She was then taken back to the operative room.  She was placed supine on the OR table.  After induction of general stage the right breast was prepped and draped in sterile fashion timeout performed.  Neoprobe used to verify seed in the right upper outer quadrant.  Transverse incision was made.  Dissection was carried down all tissue around the seed and clip were excised.  During the process the seed extruded from the tissue since it was quite friable.  This was sent separately.  I removed the specimen and Faxitron revealed the clip and the mass to be present.  I then reexcised all margins to ensure a negative margin in this case.  Gross margins were  negative.  Hemostasis achieved.  Local anesthetic infiltrated throughout the cavity and clips placed to mark the cavity.  The wound was then closed with 3-0 Vicryl and 4 Monocryl after ensuring hemostasis.  Dermabond applied.  All counts found to be correct and breast binder placed.  The patient was awoke extubated taken recovery in satisfactory condition.

## 2019-07-15 NOTE — H&P (Signed)
Becky [name]: [facility] Patient #: [id number] DOB: [birth year] Undefined / Language: Becky [name] / Race: White Female  History of Present Illness Patient words: Pt sent at the request of Dr Sondra Come for a right breast mass detected on CT. 3 CM CORE BX IDC ER / PR POS HER 2 NEU - She has low grade dementia but is functional and cognition is good Daughter is present. Denies pain but had a skin graft right breast since childhood.  The patient is a 86 year old female.   Medication History Conni Slipper, RN;  Medications Reconciled     Review of Systems Marcello Moores A. Kourtnie Sachs MD;M) All other systems negative   Physical Exam (Falyn Rubel A. Kalayla Shadden MD  General Mental Status-Alert. General Appearance-Consistent with stated age. Hydration-Well hydrated. Voice-Normal.  Eye Eyeball - Bilateral-Extraocular movements intact. Sclera/Conjunctiva - Bilateral-No scleral icterus.  Breast Note: 3 cm right breast mass no nipple STSG left breast normal  Cardiovascular Cardiovascular examination reveals -normal heart sounds, regular rate and rhythm with no murmurs and normal pedal pulses bilaterally.  Neurologic Neurologic evaluation reveals -alert and oriented x 3 with no impairment of recent or remote memory. Mental Status-Normal.  Musculoskeletal Normal Exam - Left-Upper Extremity Strength Normal and Lower Extremity Strength Normal. Normal Exam - Right-Upper Extremity Strength Normal and Lower Extremity Strength Normal.  Lymphatic Head & Neck  General Head & Neck Lymphatics: Bilateral - Description - Normal. Axillary  General Axillary Region: Bilateral - Description - Normal. Tenderness - Becky [name] Tender.    Assessment & Plan  BREAST CANCER, RIGHT (C50.911) Impression: she is fit for right breast seed lumpectomy best option for local control Risk of lumpectomy include bleeding, infection, seroma, more surgery, use of  seed/wire, wound care, cosmetic deformity and the need for other treatments, death , blood clots, death. Pt agrees to proceed  45 min for counseling, face to face exam discussion of surgery and complications and Becky [name] operative options.  Current Plans Pt Education - [facility] Free Text Education/Instructions: discussed with patient and provided information. Pt Education - [facility] Breast Biopsy HCI: discussed with patient and provided information.        Electronically signed by Erroll Luna, MD at 06/11/2019 11:24 AM

## 2019-07-15 NOTE — Anesthesia Preprocedure Evaluation (Signed)
Anesthesia Evaluation  Patient identified by MRN, date of birth, ID band Patient awake    Reviewed: Allergy & Precautions, NPO status , Patient's Chart, lab work & pertinent test results  Airway Mallampati: II  TM Distance: >3 FB Neck ROM: Full    Dental  (+) Teeth Intact, Dental Advisory Given   Pulmonary    breath sounds clear to auscultation       Cardiovascular  Rhythm:Regular Rate:Normal     Neuro/Psych    GI/Hepatic   Endo/Other    Renal/GU      Musculoskeletal   Abdominal   Peds  Hematology   Anesthesia Other Findings   Reproductive/Obstetrics                             Anesthesia Physical Anesthesia Plan  ASA: III  Anesthesia Plan: General   Post-op Pain Management:    Induction: Intravenous  PONV Risk Score and Plan:   Airway Management Planned: LMA  Additional Equipment:   Intra-op Plan:   Post-operative Plan:   Informed Consent: I have reviewed the patients History and Physical, chart, labs and discussed the procedure including the risks, benefits and alternatives for the proposed anesthesia with the patient or authorized representative who has indicated his/her understanding and acceptance.     Dental advisory given  Plan Discussed with: CRNA and Anesthesiologist  Anesthesia Plan Comments:         Anesthesia Quick Evaluation

## 2019-07-15 NOTE — Interval H&P Note (Signed)
History and Physical Interval Note:  07/15/2019 7:23 AM  Becky [name]  has presented today for surgery, with the diagnosis of RIGHT BREAST CANCER.  The various methods of treatment have been discussed with the patient and family. After consideration of risks, benefits and other options for treatment, the patient has consented to  Procedure(s): RIGHT BREAST LUMPECTOMY WITH RADIOACTIVE SEED LOCALIZATION (Right) as a surgical intervention.  The patient's history has been reviewed, patient examined, no change in status, stable for surgery.  I have reviewed the patient's chart and labs.  Questions were answered to the patient's satisfaction.     [place]

## 2019-07-15 NOTE — Anesthesia Postprocedure Evaluation (Signed)
Anesthesia Post Note  Patient: Becky [name]  Procedure(s) Performed: RADIOACTIVE SEED GUIDED RIGHT BREAST LUMPECTOMY (Right Breast)     Patient location during evaluation: PACU Anesthesia Type: General Level of consciousness: awake and alert Pain management: pain level controlled Vital Signs Assessment: post-procedure vital signs reviewed and stable Respiratory status: spontaneous breathing, nonlabored ventilation, respiratory function stable and patient connected to nasal cannula oxygen Cardiovascular status: blood pressure returned to baseline and stable Postop Assessment: no apparent nausea or vomiting Anesthetic complications: no    Last Vitals:  Vitals:   07/15/19 0920 07/15/19 0921  BP:  (!) 142/66  Pulse: 64   Resp: 15   Temp: 36.6 C   SpO2: 94%     Last Pain:  Vitals:   07/15/19 0920  TempSrc:   PainSc: 0-No pain                 Lauryn Lizardi COKER

## 2019-07-15 NOTE — Discharge Instructions (Signed)
[facility] °Office Phone Number [phone] ° °BREAST BIOPSY/ PARTIAL MASTECTOMY: POST OP INSTRUCTIONS ° °Always review your discharge instruction sheet given to you by the facility where your surgery was performed. ° °IF YOU HAVE DISABILITY OR FAMILY LEAVE FORMS, YOU MUST BRING THEM TO THE OFFICE FOR PROCESSING.  DO NOT GIVE THEM TO YOUR DOCTOR. ° °1. A prescription for pain medication may be given to you upon discharge.  Take your pain medication as prescribed, if needed.  If narcotic pain medicine is not needed, then you may take acetaminophen (Tylenol) or ibuprofen (Advil) as needed. °2. Take your usually prescribed medications unless otherwise directed °3. If you need a refill on your pain medication, please contact your pharmacy.  They will contact our office to request authorization.  Prescriptions will not be filled after 5pm or on week-ends. °4. You should eat very light the first 24 hours after surgery, such as soup, crackers, pudding, etc.  Resume your normal diet the day after surgery. °5. Most patients will experience some swelling and bruising in the breast.  Ice packs and a good support bra will help.  Swelling and bruising can take several days to resolve.  °6. It is common to experience some constipation if taking pain medication after surgery.  Increasing fluid intake and taking a stool softener will usually help or prevent this problem from occurring.  A mild laxative (Milk of Magnesia or Miralax) should be taken according to package directions if there are no bowel movements after 48 hours. °7. Unless discharge instructions indicate otherwise, you may remove your bandages 24-48 hours after surgery, and you may shower at that time.  You may have steri-strips (small skin tapes) in place directly over the incision.  These strips should be left on the skin for 7-10 days.  If your surgeon used skin glue on the incision, you may shower in 24 hours.  The glue will flake off over the  next 2-3 weeks.  Any sutures or staples will be removed at the office during your follow-up visit. °8. ACTIVITIES:  You may resume regular daily activities (gradually increasing) beginning the next day.  Wearing a good support bra or sports bra minimizes pain and swelling.  You may have sexual intercourse when it is comfortable. °a. You may drive when you no longer are taking prescription pain medication, you can comfortably wear a seatbelt, and you can safely maneuver your car and apply brakes. °b. RETURN TO WORK:  ______________________________________________________________________________________ °9. You should see your doctor in the office for a follow-up appointment approximately two weeks after your surgery.  Your doctor’s nurse will typically make your follow-up appointment when she calls you with your pathology report.  Expect your pathology report 2-3 business days after your surgery.  You may call to check if you do not hear from us after three days. °10. OTHER INSTRUCTIONS: _______________________________________________________________________________________________ _____________________________________________________________________________________________________________________________________ °_____________________________________________________________________________________________________________________________________ °_____________________________________________________________________________________________________________________________________ ° °WHEN TO CALL YOUR DOCTOR: °1. Fever over 101.0 °2. Nausea and/or vomiting. °3. Extreme swelling or bruising. °4. Continued bleeding from incision. °5. Increased pain, redness, or drainage from the incision. ° °The clinic staff is available to answer your questions during regular business hours.  Please don’t hesitate to call and ask to speak to one of the nurses for clinical concerns.  If you have a medical emergency, go to the nearest  emergency room or call 911.  A surgeon from [facility] is always on call at the hospital. ° °For further questions, please visit [email or web address]  °

## 2019-07-15 NOTE — Anesthesia Procedure Notes (Signed)
Procedure Name: LMA Insertion Date/Time: 07/15/2019 7:37 AM Performed by: Lieutenant Diego, CRNA Pre-anesthesia Checklist: Patient identified, Emergency Drugs available, Suction available and Patient being monitored Patient Re-evaluated:Patient Re-evaluated prior to induction Oxygen Delivery Method: Circle system utilized Preoxygenation: Pre-oxygenation with 100% oxygen Induction Type: IV induction Ventilation: Mask ventilation without difficulty LMA: LMA inserted LMA Size: 3.0 Number of attempts: 1 Airway Equipment and Method: Bite block Placement Confirmation: positive ETCO2 and breath sounds checked- equal and bilateral Tube secured with: Tape Dental Injury: Teeth and Oropharynx as per pre-operative assessment

## 2019-07-17 LAB — SURGICAL PATHOLOGY

## 2019-07-21 ENCOUNTER — Encounter: Payer: Self-pay | Admitting: *Deleted

## 2019-07-30 ENCOUNTER — Telehealth: Payer: Self-pay

## 2019-07-30 ENCOUNTER — Telehealth: Payer: Self-pay | Admitting: *Deleted

## 2019-07-30 NOTE — Telephone Encounter (Signed)
Telephone call received from pt's daughter, Edmund Hilda this morning. She called to cancel the consult scheduled with Dr. Sondra Come on 08/06/19. She reports due to her mother's advanced dementia the family is not going to pursue radiation. Dr. Sondra Come notified as well as the scheduler [facility].

## 2019-07-30 NOTE — Telephone Encounter (Signed)
Per Barbra Sarks, Patient has advanced dementia and family has chosen not to pursue radiation. Appointment was canceled with Dr. Sondra Come.

## 2019-08-04 ENCOUNTER — Telehealth: Payer: Self-pay

## 2019-08-04 NOTE — Telephone Encounter (Signed)
Pt's daughter, Edmund Hilda called and states pt had lumpectomy 3 weeks ago and is supposed to have radiation next, but they have decided not to go through with radiation d/t pt's age and dementia. Per Dr Jana Hakim, he prefers to see pt before originally scheduled 7/14 visit. Pt's daughter was notified to change appt to 08/15/19 at 1:30 with Dr Jana Hakim (no labs). Pt's daughter agreed to this appt. Message sent to scheduling to reschedule this.

## 2019-08-06 ENCOUNTER — Ambulatory Visit: Payer: Medicare Other | Admitting: Radiation Oncology

## 2019-08-06 ENCOUNTER — Ambulatory Visit: Payer: Medicare Other

## 2019-08-07 ENCOUNTER — Encounter: Payer: Self-pay | Admitting: *Deleted

## 2019-08-15 ENCOUNTER — Inpatient Hospital Stay (HOSPITAL_BASED_OUTPATIENT_CLINIC_OR_DEPARTMENT_OTHER): Payer: Medicare Other | Admitting: Oncology

## 2019-08-15 ENCOUNTER — Other Ambulatory Visit: Payer: Self-pay

## 2019-08-15 ENCOUNTER — Inpatient Hospital Stay: Payer: Medicare Other | Attending: Oncology

## 2019-08-15 VITALS — BP 104/67 | HR 110 | Temp 97.8°F | Resp 18 | Ht 60.0 in | Wt 125.8 lb

## 2019-08-15 DIAGNOSIS — M818 Other osteoporosis without current pathological fracture: Secondary | ICD-10-CM

## 2019-08-15 DIAGNOSIS — C50411 Malignant neoplasm of upper-outer quadrant of right female breast: Secondary | ICD-10-CM | POA: Insufficient documentation

## 2019-08-15 DIAGNOSIS — M810 Age-related osteoporosis without current pathological fracture: Secondary | ICD-10-CM | POA: Insufficient documentation

## 2019-08-15 DIAGNOSIS — Z170 Estrogen receptor positive status [ER+]: Secondary | ICD-10-CM

## 2019-08-15 DIAGNOSIS — F0390 Unspecified dementia without behavioral disturbance: Secondary | ICD-10-CM | POA: Diagnosis not present

## 2019-08-15 LAB — CBC WITH DIFFERENTIAL/PLATELET
Abs Immature Granulocytes: 0.03 10*3/uL (ref 0.00–0.07)
Basophils Absolute: 0.1 10*3/uL (ref 0.0–0.1)
Basophils Relative: 1 %
Eosinophils Absolute: 0.4 10*3/uL (ref 0.0–0.5)
Eosinophils Relative: 6 %
HCT: 46.9 % — ABNORMAL HIGH (ref 36.0–46.0)
Hemoglobin: 15.6 g/dL — ABNORMAL HIGH (ref 12.0–15.0)
Immature Granulocytes: 0 %
Lymphocytes Relative: 21 %
Lymphs Abs: 1.6 10*3/uL (ref 0.7–4.0)
MCH: 31.8 pg (ref 26.0–34.0)
MCHC: 33.3 g/dL (ref 30.0–36.0)
MCV: 95.7 fL (ref 80.0–100.0)
Monocytes Absolute: 0.7 10*3/uL (ref 0.1–1.0)
Monocytes Relative: 10 %
Neutro Abs: 4.8 10*3/uL (ref 1.7–7.7)
Neutrophils Relative %: 62 %
Platelets: 246 10*3/uL (ref 150–400)
RBC: 4.9 MIL/uL (ref 3.87–5.11)
RDW: 11.9 % (ref 11.5–15.5)
WBC: 7.7 10*3/uL (ref 4.0–10.5)
nRBC: 0 % (ref 0.0–0.2)

## 2019-08-15 LAB — COMPREHENSIVE METABOLIC PANEL
ALT: 13 U/L (ref 0–44)
AST: 16 U/L (ref 15–41)
Albumin: 3.4 g/dL — ABNORMAL LOW (ref 3.5–5.0)
Alkaline Phosphatase: 81 U/L (ref 38–126)
Anion gap: 11 (ref 5–15)
BUN: 13 mg/dL (ref 8–23)
CO2: 23 mmol/L (ref 22–32)
Calcium: 9.5 mg/dL (ref 8.9–10.3)
Chloride: 107 mmol/L (ref 98–111)
Creatinine, Ser: 0.79 mg/dL (ref 0.44–1.00)
GFR calc Af Amer: >60 mL/min (ref >60)
GFR calc non Af Amer: >60 mL/min (ref >60)
Glucose, Bld: 143 mg/dL — ABNORMAL HIGH (ref 70–99)
Potassium: 3.9 mmol/L (ref 3.5–5.1)
Sodium: 141 mmol/L (ref 135–145)
Total Bilirubin: 0.8 mg/dL (ref 0.3–1.2)
Total Protein: 7.1 g/dL (ref 6.5–8.1)

## 2019-08-15 MED ORDER — ANASTROZOLE 1 MG PO TABS
1.0000 mg | ORAL_TABLET | Freq: Every day | ORAL | 4 refills | Status: DC
Start: 2019-08-15 — End: 2022-08-22

## 2019-08-15 NOTE — Progress Notes (Signed)
[facility]  [phone] [phone]) [phone]     ID: Becky [name] DOB: [birth year]  MR#: [id number]  UUV#:[id number]  Patient Care Team: Leonard Downing, MD as PCP - General (Family Medicine) [name] Harvie Heck, MD as Referring Physician (Ophthalmology) Mauro Kaufmann, RN as Oncology Nurse Navigator Rockwell Germany, RN as Oncology Nurse Navigator Gery Pray, MD as Consulting Physician (Radiation Oncology) Demitrus Francisco, Virgie Dad, MD as Consulting Physician (Oncology) Erroll Luna, MD as Consulting Physician (General Surgery) Chauncey Cruel, MD OTHER MD:  CHIEF COMPLAINT: Estrogen receptor positive breast cancer  CURRENT TREATMENT: Anastrozole   INTERVAL HISTORY: Becky [name] returns today for follow up of her estrogen receptor positive breast cancer. She was evaluated in the multidisciplinary breast cancer clinic on 06/11/2019.  She is accompanied by her daughter Becky [name]  The patient underwent right lumpectomy on 07/15/2019 under Dr. Brantley Stage. Pathology from the procedure ([id number]) showed: invasive ductal carcinoma, grade 2, measuring 2.7 cm; final margins negative.  The family has discussed adjuvant radiation and has decided against it.  REVIEW OF SYSTEMS: Becky [name] did well with her surgery, with no unusual pain, fever, bleeding, or other complications.  She denies pain, fever, rash, headaches, nausea, vomiting, cough, phlegm production, or pleurisy.  There has been no constipation, diarrhea or change in bowel or bladder habits.  All this was to the best of the patient's ability to tell me given her mild dementia and with the participation of her daughter.   HISTORY OF CURRENT ILLNESS: From the original intake note:  Becky [name] presented to the ED on 04/20/2019 with chest pain and shortness of breath. Angio chest CT performed at that time revealed a 2.2 cm mass in the medial aspect of the right breast. She underwent bilateral diagnostic mammography  with tomography and right breast ultrasonography at [facility] on 05/20/2019 showing: breast density category B; 2.9 cm lobulated mass in right breast at 12 o'clock; no significant abnormalities in right axilla.  Accordingly on 06/02/2019 she proceeded to biopsy of the right breast area in question. The pathology from this procedure ([id number]) showed: invasive ductal carcinoma with calcifications, grade 3. Prognostic indicators significant for: estrogen receptor, 100% positive and progesterone receptor, 80% positive, both with strong staining intensity. Proliferation marker Ki67 at 30%. HER2 negative by immunohistochemistry (1+).  The patient's subsequent history is as detailed below.   PAST MEDICAL HISTORY: Past Medical History:  Diagnosis Date  . Arthritis   . COPD (chronic obstructive pulmonary disease) ([place])   . Dementia ([place])   . Depression    and anxiety  . Fibromyalgia   . Fuchs' corneal dystrophy    L eye  . Glaucoma    R eye  . Hyperlipidemia   . Migraines   . Osteoporosis   . Polio    PPS  (age 45 yr)  . Scoliosis   . Ulcer    small- stomach    PAST SURGICAL HISTORY: Past Surgical History:  Procedure Laterality Date  . BREAST LUMPECTOMY WITH RADIOACTIVE SEED LOCALIZATION Right 07/15/2019   Procedure: RADIOACTIVE SEED GUIDED RIGHT BREAST LUMPECTOMY;  Surgeon: Erroll Luna, MD;  Location: [facility];  Service: General;  Laterality: Right;  . inguinal herniorrhaphy    . TUBAL LIGATION      FAMILY HISTORY: Family History  Problem Relation Age of Onset  . Arthritis Brother   . Cancer Brother        esophageal  . Alcohol abuse Other        addiction   .  Arthritis Other   . Colon cancer Other        1st degree relative <60  . Diabetes Other        1st degree relative  . Psychosis Other    Her mother died in her 68's and her father at age 67. She had 3 brothers and 2 sisters. She reports esophageal cancer in her brother around age 18.  There is no other cancer history in  the family to her or her daughter's knowledge   GYNECOLOGIC HISTORY:  No LMP recorded. Patient is postmenopausal. Menarche: age unsure Age at first live birth: 86 years old [place] P 3 LMP unsure Contraceptive: never used HRT never used  Hysterectomy? no BSO? 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 She graduated from [employer]. She is divorced. She lives at [facility] in the assisted living section.. Daughter Becky [name], age 21, is a retired Marine scientist living in [place]. Becky Becky [name], age 12, works in Manufacturing systems engineer for [employer] has one grandchild. She attends a [employer].    ADVANCED DIRECTIVES: Becky [name] is her HCPOA. He can be reached at [phone].   HEALTH MAINTENANCE: Social History   Tobacco Use  . Smoking status: Never Smoker  . Smokeless tobacco: Never Used  Vaping Use  . Vaping Use: Never used  Substance Use Topics  . Alcohol use: No  . Drug use: No     Colonoscopy: date unsure  PAP: date unsure  Bone density: date unsure   Allergies  Allergen Reactions  . Brimonidine     Unknown    Current Outpatient Medications  Medication Sig Dispense Refill  . acetaminophen (MAPAP) 500 MG tablet Take 500 mg by mouth every 6 (six) hours as needed (for pain).    Marland Kitchen anastrozole (ARIMIDEX) 1 MG tablet Take 1 tablet (1 mg total) by mouth daily. 90 tablet 4  . Cholecalciferol (VITAMIN D3) 5000 UNITS CAPS Take 10,000 Units by mouth every Tuesday.     . diclofenac Sodium (VOLTAREN) 1 % GEL Apply 2-4 g topically See admin instructions. Apply 4 grams to left knee daily as needed for pain & apply 2 grams to right shoulder twice daily as needed for pain.    . Eyelid Cleansers (OCUSOFT LID SCRUB EX) Place 1 application into both eyes daily.    Marland Kitchen ipratropium (ATROVENT) 0.06 % nasal spray Place 2 sprays into both nostrils daily as needed (for clearing her throat due to post nasal drip).    . Memantine  HCl-Donepezil HCl (NAMZARIC) 28-10 MG CP24 Take 1 capsule by mouth daily with breakfast. (0800)    . Multiple Vitamin (MULTIVITAMIN WITH MINERALS) TABS tablet Take 1 tablet by mouth daily. (0800)    . Nutritional Supplements (RA NUTRITIONAL SUPPORT PO) Take 120 mLs by mouth in the morning and at bedtime. Medpass    . Polyvinyl Alcohol-Povidone (REFRESH OP) Place 1 drop into both eyes in the morning, at noon, in the evening, and at bedtime.     . sertraline (ZOLOFT) 100 MG tablet Take 100 mg by mouth daily after breakfast. (0800)    . sodium chloride (ALTACHLORE) 5 % ophthalmic ointment Place 1 application into the left eye 4 (four) times daily.    . Travoprost, BAK Free, (TRAVATAN) 0.004 % SOLN ophthalmic solution Place 1 drop into the right eye at bedtime.    . White Petrolatum-Mineral Oil (GENTEAL TEARS NIGHT-TIME) OINT Place 1 application into  the left eye as needed (fuchs' dystrophy).    . Zinc Oxide (SECURA PROTECTIVE EX) Apply 1 application topically 2 (two) times daily as needed (irritation.).    Marland Kitchen zinc oxide 20 % ointment Apply 1 application topically 2 (two) times daily as needed for irritation.     No current facility-administered medications for this visit.    OBJECTIVE: White woman examined in a wheelchair  Vitals:   08/15/19 1338  BP: 104/67  Pulse: (!) 110  Resp: 18  Temp: 97.8 F (36.6 C)  SpO2: 93%     Body mass index is 24.57 kg/m.   Wt Readings from Last 3 Encounters:  08/15/19 125 lb 12.8 oz (57.1 kg)  07/15/19 130 lb (59 kg)  07/11/19 126 lb 14.4 oz (57.6 kg)      ECOG FS:2 - Symptomatic, <50% confined to bed  Sclerae unicteric, EOMs intact Wearing a mask No cervical or supraclavicular adenopathy Lungs no rales or rhonchi Heart regular rate and rhythm Abd soft, nontender, positive bowel sounds MSK no focal spinal tenderness, no upper extremity lymphedema Neuro: nonfocal, well oriented, appropriate affect Breasts: The right breast is status post recent  lumpectomy.  The incision is healing nicely without dehiscence erythema or swelling.  The left breast is benign.  Both axillae are benign.   LAB RESULTS:  CMP     Component Value Date/Time   NA 141 08/15/2019 1321   K 3.9 08/15/2019 1321   CL 107 08/15/2019 1321   CO2 23 08/15/2019 1321   GLUCOSE 143 (H) 08/15/2019 1321   BUN 13 08/15/2019 1321   CREATININE 0.79 08/15/2019 1321   CREATININE 0.71 06/11/2019 0841   CALCIUM 9.5 08/15/2019 1321   PROT 7.1 08/15/2019 1321   ALBUMIN 3.4 (L) 08/15/2019 1321   AST 16 08/15/2019 1321   AST 16 06/11/2019 0841   ALT 13 08/15/2019 1321   ALT 14 06/11/2019 0841   ALKPHOS 81 08/15/2019 1321   BILITOT 0.8 08/15/2019 1321   BILITOT 1.1 06/11/2019 0841   GFRNONAA >60 08/15/2019 1321   GFRNONAA >60 06/11/2019 0841   GFRAA >60 08/15/2019 1321   GFRAA >60 06/11/2019 0841    No results found for: TOTALPROTELP, ALBUMINELP, A1GS, A2GS, BETS, BETA2SER, GAMS, MSPIKE, SPEI  Lab Results  Component Value Date   WBC 7.7 08/15/2019   NEUTROABS 4.8 08/15/2019   HGB 15.6 (H) 08/15/2019   HCT 46.9 (H) 08/15/2019   MCV 95.7 08/15/2019   PLT 246 08/15/2019    No results found for: LABCA2  No components found for: QPRFFM384  No results for input(s): INR in the last 168 hours.  No results found for: LABCA2  No results found for: YKZ993  No results found for: TTS177  No results found for: LTJ030  No results found for: CA2729  No components found for: HGQUANT  No results found for: CEA1 / No results found for: CEA1   No results found for: AFPTUMOR  No results found for: CHROMOGRNA  No results found for: KPAFRELGTCHN, LAMBDASER, KAPLAMBRATIO (kappa/lambda light chains)  No results found for: HGBA, HGBA2QUANT, HGBFQUANT, HGBSQUAN (Hemoglobinopathy evaluation)   No results found for: LDH  No results found for: IRON, TIBC, IRONPCTSAT (Iron and TIBC)  No results found for: FERRITIN  Urinalysis    Component Value Date/Time    COLORURINE YELLOW 04/17/2010 1448   APPEARANCEUR CLOUDY (A) 04/17/2010 1448   LABSPEC 1.008 04/17/2010 1448   PHURINE 6.0 04/17/2010 1448   HGBUR NEGATIVE 04/17/2010 1448   HGBUR  negative 08/19/2008 0000   BILIRUBINUR n 08/12/2013 1700   KETONESUR NEGATIVE 04/17/2010 1448   PROTEINUR n 08/12/2013 1700   PROTEINUR NEGATIVE 04/17/2010 1448   UROBILINOGEN 0.2 08/12/2013 1700   UROBILINOGEN 1.0 04/17/2010 1448   NITRITE n 08/12/2013 1700   NITRITE NEGATIVE 04/17/2010 1448   LEUKOCYTESUR moderate (2+) 08/12/2013 1700     STUDIES: No results found.   ELIGIBLE FOR AVAILABLE RESEARCH PROTOCOL: AET  ASSESSMENT: 86 y.o. Becky [name], Becky [name] woman status post right breast upper outer quadrant biopsy 06/02/2019 for a clinical T2 N0, stage IIA invasive ductal carcinoma, grade 3, estrogen and progesterone receptor positive, HER-2 not amplified, with an MIB-1 of 30%  (1) status post right lumpectomy without sentinel lymph node sampling 07/15/2019 for a pT2 cN0, stage IB invasive ductal carcinoma, grade 2, with negative margins.  (2) patient and family opted against adjuvant radiation  (3) anastrozole started 08/15/2019  (a) bone density 06/25/2013 at [facility] shows a T score of -2.5   PLAN: I reviewed the pathology report with the patient and her daughter today.  They understand that she had early stage breast cancer and that the tumor was removed with clear margins.  We frequently do adjuvant radiation to reduce local recurrence, but we have good data that in women over 70 with small estrogen receptor positive tumors like Becky [name]' radiation can be omitted so as the patient takes antiestrogens for 5years and there is no survival difference as compared to receiving radiation and antiestrogens.  The family has decided against radiation and I am comfortable with that.  The patient will start antiestrogens and I specifically recommended anastrozole today.  He understands this may cause some vaginal  dryness and hot flashes.  Rarely in about 20% of patients it may cause some achiness in which case of course we would stop the medication and consider alternatives.  The main concern regarding anastrozole is worsening of a bone density.  This patient does have osteoporosis per a bone density obtained 6 years ago.  I think it would be useful to repeat a bone density assuming the patient is able to tolerate anastrozole.  The plan accordingly will be to do a virtual visit in about 24month.  If the patient is tolerating anastrozole well we will set her up for a repeat bone density at her next mammography, which would be March of next year  The patient and daughter have a good understanding of this.  The prescription for anastrozole has been placed to [facility] pharmacy that [facility] [place].  They know to call for any other issues that may develop before the next visit here  Total encounter time 35 minutes.*Sarajane JewsC. , MD 08/16/2019 9:14 AM Medical On[facility] H[facility] [id number]. [phone]   Fax. [phone]  This document serves as a record of services personally performed by GLurline Del MD. It was created on his behalf by KWilburn Mylar a trained medical scribe. The creation of this record is based on the scribe's personal observations and the provider's statements to them.   I, GLurline DelMD, have reviewed the above documentation for accuracy and completeness, and I agree with the above.   *Total Encounter Time as defined by the Centers for Medicare and Medicaid Services includes, in addition to the face-to-face time of a patient visit (documented in the note above) non-face-to-face time: obtaining and reviewing outside history, ordering and reviewing medications, tests or procedures,  care coordination (communications with other health care professionals or caregivers) and documentation in the medical  record.

## 2019-08-18 ENCOUNTER — Telehealth: Payer: Self-pay | Admitting: Oncology

## 2019-08-18 NOTE — Telephone Encounter (Signed)
Scheduled appts per 6/21 los. Pt's daughter confirmed appt date and time.

## 2019-09-10 ENCOUNTER — Ambulatory Visit: Payer: Medicare Other | Admitting: Oncology

## 2019-09-10 ENCOUNTER — Other Ambulatory Visit: Payer: Medicare Other

## 2019-10-22 NOTE — Progress Notes (Signed)
[facility]  [phone] [phone]     ID: Becky [name] DOB: Sep 11, 1929  MR#: [id number]  VZD#:[id number]  Patient Care Team: Becky Downing, MD as PCP - General (Family Medicine) Becky Paradise Harvie Heck, MD as Referring Physician (Ophthalmology) Becky Kaufmann, RN as Oncology Nurse Navigator Becky Germany, RN as Oncology Nurse Navigator Becky Pray, MD as Consulting Physician (Radiation Oncology) Becky [name], Virgie Dad, MD as Consulting Physician (Oncology) Becky Luna, MD as Consulting Physician (General Surgery) Becky Cruel, MD OTHER MD:  I connected with Becky [name] on 10/23/19 at 12:00 PM EDT by telephone visit and verified that I am speaking with the correct person using two identifiers.   I discussed the limitations, risks, security and privacy concerns of performing an evaluation and management service by telemedicine and the availability of in-person appointments. I also discussed with the patient that there may be a patient responsible charge related to this service. The patient expressed understanding and agreed to proceed.   Other persons participating in the visit and their role in the encounter: Patient 's daughter  Patient's location: [facility] Provider's location: [facility]    CHIEF COMPLAINT: Estrogen receptor positive breast cancer  CURRENT TREATMENT: Anastrozole   INTERVAL HISTORY: Becky [name] was contacted today for follow up of her estrogen receptor positive breast cancer.  The patient's daughter answered the phone and she was the primary informant as the patient herself is demented.  Unfortunately we were not able to connect to video so I was not able to see the patient directly.  Becky [name] was started on anastrozole at her last visit on 08/16/2019.  According to her daughter she is tolerating it well, with no unusual hot flashes or any arthralgias or myalgias   REVIEW OF SYSTEMS: Becky [name]  is stable at [facility], which is her permanent residence now.  She has significant dementia.  I have changed the telephones so that for any visits and contact the daughter Becky [name] is they want to be contacted   HISTORY OF CURRENT ILLNESS: From the original intake note:  Becky [name] presented to the ED on 04/20/2019 with chest pain and shortness of breath. Angio chest CT performed at that time revealed a 2.2 cm mass in the medial aspect of the right breast. She underwent bilateral diagnostic mammography with tomography and right breast ultrasonography at [facility] on 05/20/2019 showing: breast density category B; 2.9 cm lobulated mass in right breast at 12 o'clock; no significant abnormalities in right axilla.  Accordingly on 06/02/2019 she proceeded to biopsy of the right breast area in question. The pathology from this procedure ([id number]) showed: invasive ductal carcinoma with calcifications, grade 3. Prognostic indicators significant for: estrogen receptor, 100% positive and progesterone receptor, 80% positive, both with strong staining intensity. Proliferation marker Ki67 at 30%. HER2 negative by immunohistochemistry (1+).  The patient's subsequent history is as detailed below.   PAST MEDICAL HISTORY: Past Medical History:  Diagnosis Date  . Arthritis   . COPD (chronic obstructive pulmonary disease) ([place])   . Dementia ([place])   . Depression    and anxiety  . Fibromyalgia   . Fuchs' corneal dystrophy    L eye  . Glaucoma    R eye  . Hyperlipidemia   . Migraines   . Osteoporosis   . Polio    PPS  (age 6 yr)  . Scoliosis   . Ulcer    small- stomach    PAST SURGICAL  HISTORY: Past Surgical History:  Procedure Laterality Date  . BREAST LUMPECTOMY WITH RADIOACTIVE SEED LOCALIZATION Right 07/15/2019   Procedure: RADIOACTIVE SEED GUIDED RIGHT BREAST LUMPECTOMY;  Surgeon: Becky Luna, MD;  Location: [facility];  Service: General;  Laterality: Right;  . inguinal herniorrhaphy    .  TUBAL LIGATION      FAMILY HISTORY: Family History  Problem Relation Age of Onset  . Arthritis Brother   . Cancer Brother        esophageal  . Alcohol abuse Other        addiction   . Arthritis Other   . Colon cancer Other        1st degree relative <60  . Diabetes Other        1st degree relative  . Psychosis Other    Her mother died in her 74's and her father at age 39. She had 3 brothers and 2 sisters. She reports esophageal cancer in her brother around age 33.  There is no other cancer history in the family to her or her daughter's knowledge   GYNECOLOGIC HISTORY:  No LMP recorded. Patient is postmenopausal. Menarche: age unsure Age at first live birth: 86 years old [place] P 3 LMP unsure Contraceptive: never used HRT never used  Hysterectomy? no BSO? 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 She graduated from [employer]. She is divorced. She lives at [facility] in the assisted living section.. Daughter Becky [name], age 85, is a retired Marine scientist living in [place]. Son Becky [name], age 62, works in Manufacturing systems engineer for [employer] has one grandchild. She attends a [employer].    ADVANCED DIRECTIVES: Son Becky [name] is her HCPOA. He can be reached at [phone].   HEALTH MAINTENANCE: Social History   Tobacco Use  . Smoking status: Never Smoker  . Smokeless tobacco: Never Used  Vaping Use  . Vaping Use: Never used  Substance Use Topics  . Alcohol use: No  . Drug use: No     Colonoscopy: date unsure  PAP: date unsure  Bone density: date unsure   Allergies  Allergen Reactions  . Brimonidine     Unknown    Current Outpatient Medications  Medication Sig Dispense Refill  . acetaminophen (MAPAP) 500 MG tablet Take 500 mg by mouth every 6 (six) hours as needed (for pain).    Marland Kitchen anastrozole (ARIMIDEX) 1 MG tablet Take 1 tablet (1 mg total) by mouth daily. 90 tablet 4  . Cholecalciferol (VITAMIN D3) 5000 UNITS CAPS Take  10,000 Units by mouth every Tuesday.     . diclofenac Sodium (VOLTAREN) 1 % GEL Apply 2-4 g topically See admin instructions. Apply 4 grams to left knee daily as needed for pain & apply 2 grams to right shoulder twice daily as needed for pain.    . Eyelid Cleansers (OCUSOFT LID SCRUB EX) Place 1 application into both eyes daily.    Marland Kitchen ipratropium (ATROVENT) 0.06 % nasal spray Place 2 sprays into both nostrils daily as needed (for clearing her throat due to post nasal drip).    . Memantine HCl-Donepezil HCl (NAMZARIC) 28-10 MG CP24 Take 1 capsule by mouth daily with breakfast. (0800)    . Multiple Vitamin (MULTIVITAMIN WITH MINERALS) TABS tablet Take 1 tablet by mouth daily. (0800)    . Nutritional Supplements (RA NUTRITIONAL SUPPORT PO) Take 120 mLs by mouth in the morning and at bedtime. Medpass    . Polyvinyl  Alcohol-Povidone (REFRESH OP) Place 1 drop into both eyes in the morning, at noon, in the evening, and at bedtime.     . sertraline (ZOLOFT) 100 MG tablet Take 100 mg by mouth daily after breakfast. (0800)    . sodium chloride (ALTACHLORE) 5 % ophthalmic ointment Place 1 application into the left eye 4 (four) times daily.    . Travoprost, BAK Free, (TRAVATAN) 0.004 % SOLN ophthalmic solution Place 1 drop into the right eye at bedtime.    . White Petrolatum-Mineral Oil (GENTEAL TEARS NIGHT-TIME) OINT Place 1 application into the left eye as needed (fuchs' dystrophy).    . Zinc Oxide (SECURA PROTECTIVE EX) Apply 1 application topically 2 (two) times daily as needed (irritation.).    Marland Kitchen zinc oxide 20 % ointment Apply 1 application topically 2 (two) times daily as needed for irritation.     No current facility-administered medications for this visit.    OBJECTIVE: White woman   There were no vitals filed for this visit.   There is no height or weight on file to calculate BMI.   Wt Readings from Last 3 Encounters:  08/15/19 125 lb 12.8 oz (57.1 kg)  07/15/19 130 lb (59 kg)  07/11/19 126 lb  14.4 oz (57.6 kg)      ECOG FS:2 - Symptomatic, <50% confined to bed  Telemedicine visit 10/23/2019   LAB RESULTS:  CMP     Component Value Date/Time   NA 141 08/15/2019 1321   K 3.9 08/15/2019 1321   CL 107 08/15/2019 1321   CO2 23 08/15/2019 1321   GLUCOSE 143 (H) 08/15/2019 1321   BUN 13 08/15/2019 1321   CREATININE 0.79 08/15/2019 1321   CREATININE 0.71 06/11/2019 0841   CALCIUM 9.5 08/15/2019 1321   PROT 7.1 08/15/2019 1321   ALBUMIN 3.4 (L) 08/15/2019 1321   AST 16 08/15/2019 1321   AST 16 06/11/2019 0841   ALT 13 08/15/2019 1321   ALT 14 06/11/2019 0841   ALKPHOS 81 08/15/2019 1321   BILITOT 0.8 08/15/2019 1321   BILITOT 1.1 06/11/2019 0841   GFRNONAA >60 08/15/2019 1321   GFRNONAA >60 06/11/2019 0841   GFRAA >60 08/15/2019 1321   GFRAA >60 06/11/2019 0841    No results found for: TOTALPROTELP, ALBUMINELP, A1GS, A2GS, BETS, BETA2SER, GAMS, MSPIKE, SPEI  Lab Results  Component Value Date   WBC 7.7 08/15/2019   NEUTROABS 4.8 08/15/2019   HGB 15.6 (H) 08/15/2019   HCT 46.9 (H) 08/15/2019   MCV 95.7 08/15/2019   PLT 246 08/15/2019    No results found for: LABCA2  No components found for: WNUUVO536  No results for input(s): INR in the last 168 hours.  No results found for: LABCA2  No results found for: UYQ034  No results found for: VQQ595  No results found for: GLO756  No results found for: CA2729  No components found for: HGQUANT  No results found for: CEA1 / No results found for: CEA1   No results found for: AFPTUMOR  No results found for: CHROMOGRNA  No results found for: KPAFRELGTCHN, LAMBDASER, KAPLAMBRATIO (kappa/lambda light chains)  No results found for: HGBA, HGBA2QUANT, HGBFQUANT, HGBSQUAN (Hemoglobinopathy evaluation)   No results found for: LDH  No results found for: IRON, TIBC, IRONPCTSAT (Iron and TIBC)  No results found for: FERRITIN  Urinalysis    Component Value Date/Time   COLORURINE YELLOW 04/17/2010 1448    APPEARANCEUR CLOUDY (A) 04/17/2010 1448   LABSPEC 1.008 04/17/2010 1448   PHURINE 6.0  04/17/2010 1448   HGBUR NEGATIVE 04/17/2010 1448   HGBUR negative 08/19/2008 0000   BILIRUBINUR n 08/12/2013 1700   KETONESUR NEGATIVE 04/17/2010 1448   PROTEINUR n 08/12/2013 1700   PROTEINUR NEGATIVE 04/17/2010 1448   UROBILINOGEN 0.2 08/12/2013 1700   UROBILINOGEN 1.0 04/17/2010 1448   NITRITE n 08/12/2013 1700   NITRITE NEGATIVE 04/17/2010 1448   LEUKOCYTESUR moderate (2+) 08/12/2013 1700     STUDIES: No results found.   ELIGIBLE FOR AVAILABLE RESEARCH PROTOCOL: AET  ASSESSMENT: 86 y.o. [place], [place] woman status post right breast upper outer quadrant biopsy 06/02/2019 for a clinical T2 N0, stage IIA invasive ductal carcinoma, grade 3, estrogen and progesterone receptor positive, HER-2 not amplified, with an MIB-1 of 30%  (1) status post right lumpectomy without sentinel lymph node sampling 07/15/2019 for a pT2 cN0, stage IB invasive ductal carcinoma, grade 2, with negative margins.  (2) patient and family opted against adjuvant radiation  (3) anastrozole started 08/15/2019  (a) bone density 06/25/2013 at [facility] shows a T score of -2.5   PLAN: Catalia is tolerating anastrozole well.  Very likely that is the only intervention she will need for her breast cancer and that should be sufficient to take care of that diagnosis given her advanced age and other comorbidities and dementia.  Tentatively I am making her a return appointment here for April of next year, after her next mammogram.  We will obtain a bone density at the same time  I encouraged him to call us with any other issues that may develop.    Virgie Dad. Halil Rentz, MD 10/23/2019 12:24 PM Medical Oncology and Hematology [facility] [place], [place] [address] Tel. [phone]    Fax. [phone]   This document serves as a record of services personally performed by Lurline Del, MD. It was  created on his behalf by Wilburn Mylar, a trained medical scribe. The creation of this record is based on the scribe's personal observations and the provider's statements to them.   I, Lurline Del MD, have reviewed the above documentation for accuracy and completeness, and I agree with the above.   *Total Encounter Time as defined by the Centers for Medicare and Medicaid Services includes, in addition to the face-to-face time of a patient visit (documented in the note above) non-face-to-face time: obtaining and reviewing outside history, ordering and reviewing medications, tests or procedures, care coordination (communications with other health care professionals or caregivers) and documentation in the medical record.

## 2019-10-23 ENCOUNTER — Inpatient Hospital Stay: Payer: Medicare Other | Attending: Oncology | Admitting: Oncology

## 2019-10-23 DIAGNOSIS — M818 Other osteoporosis without current pathological fracture: Secondary | ICD-10-CM | POA: Diagnosis not present

## 2019-10-23 DIAGNOSIS — Z170 Estrogen receptor positive status [ER+]: Secondary | ICD-10-CM | POA: Insufficient documentation

## 2019-10-23 DIAGNOSIS — C50411 Malignant neoplasm of upper-outer quadrant of right female breast: Secondary | ICD-10-CM | POA: Insufficient documentation

## 2020-02-24 ENCOUNTER — Telehealth: Payer: Self-pay | Admitting: *Deleted

## 2020-02-24 NOTE — Telephone Encounter (Signed)
This RN spoke with the patient's daughterRea College- per her call stating pt starting having " incontinent diarrhea 1 x day "  Above started approximately 2 weeks ago.  Becky [name] is inquiring if above could be a side effect of the anastrazole.  Pt is staying at an Assisted Living facility ( [facility] in [place] ).  This RN informed her above is not likely related to the anastrazole- concern is more related to possible C Difficile infection that can be common in care facilities.  This RN asked about attending medical at [facility] and need for Becky [name] to communicate with them regarding issue for appropriate testing.  Becky [name] asked if Dr Darnelle Catalan would want a fecal sample - with this RN explaining need for her to communicate issue with attending at the patient's current facility for best outcome.  Becky [name] verbalized understanding.

## 2020-05-13 ENCOUNTER — Other Ambulatory Visit: Payer: Self-pay

## 2020-05-13 ENCOUNTER — Emergency Department (HOSPITAL_COMMUNITY): Payer: Medicare Other

## 2020-05-13 ENCOUNTER — Encounter (HOSPITAL_COMMUNITY): Payer: Self-pay

## 2020-05-13 ENCOUNTER — Inpatient Hospital Stay (HOSPITAL_COMMUNITY)
Admission: EM | Admit: 2020-05-13 | Discharge: 2020-05-19 | DRG: 183 | Disposition: A | Discharge status: Skilled Nursing Facility | Payer: Medicare Other | Source: Skilled Nursing Facility | Attending: Internal Medicine | Admitting: Internal Medicine

## 2020-05-13 ENCOUNTER — Observation Stay (HOSPITAL_COMMUNITY): Payer: Medicare Other

## 2020-05-13 DIAGNOSIS — F3342 Major depressive disorder, recurrent, in full remission: Secondary | ICD-10-CM | POA: Diagnosis not present

## 2020-05-13 DIAGNOSIS — I513 Intracardiac thrombosis, not elsewhere classified: Secondary | ICD-10-CM | POA: Diagnosis not present

## 2020-05-13 DIAGNOSIS — L8990 Pressure ulcer of unspecified site, unspecified stage: Secondary | ICD-10-CM | POA: Diagnosis not present

## 2020-05-13 DIAGNOSIS — L89321 Pressure ulcer of left buttock, stage 1: Secondary | ICD-10-CM | POA: Diagnosis present

## 2020-05-13 DIAGNOSIS — S2242XD Multiple fractures of ribs, left side, subsequent encounter for fracture with routine healing: Secondary | ICD-10-CM | POA: Diagnosis not present

## 2020-05-13 DIAGNOSIS — I71019 Dissection of thoracic aorta, unspecified: Secondary | ICD-10-CM

## 2020-05-13 DIAGNOSIS — I7410 Embolism and thrombosis of unspecified parts of aorta: Secondary | ICD-10-CM | POA: Diagnosis present

## 2020-05-13 DIAGNOSIS — S2232XA Fracture of one rib, left side, initial encounter for closed fracture: Secondary | ICD-10-CM

## 2020-05-13 DIAGNOSIS — I7101 Dissection of thoracic aorta: Secondary | ICD-10-CM

## 2020-05-13 DIAGNOSIS — N390 Urinary tract infection, site not specified: Secondary | ICD-10-CM | POA: Diagnosis not present

## 2020-05-13 DIAGNOSIS — S2242XA Multiple fractures of ribs, left side, initial encounter for closed fracture: Principal | ICD-10-CM | POA: Diagnosis present

## 2020-05-13 DIAGNOSIS — H409 Unspecified glaucoma: Secondary | ICD-10-CM | POA: Diagnosis not present

## 2020-05-13 DIAGNOSIS — F419 Anxiety disorder, unspecified: Secondary | ICD-10-CM | POA: Diagnosis present

## 2020-05-13 DIAGNOSIS — M810 Age-related osteoporosis without current pathological fracture: Secondary | ICD-10-CM | POA: Diagnosis present

## 2020-05-13 DIAGNOSIS — I5023 Acute on chronic systolic (congestive) heart failure: Secondary | ICD-10-CM | POA: Diagnosis present

## 2020-05-13 DIAGNOSIS — S2239XA Fracture of one rib, unspecified side, initial encounter for closed fracture: Secondary | ICD-10-CM

## 2020-05-13 DIAGNOSIS — Z170 Estrogen receptor positive status [ER+]: Secondary | ICD-10-CM

## 2020-05-13 DIAGNOSIS — E785 Hyperlipidemia, unspecified: Secondary | ICD-10-CM | POA: Diagnosis present

## 2020-05-13 DIAGNOSIS — W19XXXA Unspecified fall, initial encounter: Secondary | ICD-10-CM | POA: Diagnosis present

## 2020-05-13 DIAGNOSIS — R55 Syncope and collapse: Secondary | ICD-10-CM | POA: Diagnosis not present

## 2020-05-13 DIAGNOSIS — F329 Major depressive disorder, single episode, unspecified: Secondary | ICD-10-CM | POA: Diagnosis present

## 2020-05-13 DIAGNOSIS — E876 Hypokalemia: Secondary | ICD-10-CM

## 2020-05-13 DIAGNOSIS — C50411 Malignant neoplasm of upper-outer quadrant of right female breast: Secondary | ICD-10-CM

## 2020-05-13 DIAGNOSIS — J449 Chronic obstructive pulmonary disease, unspecified: Secondary | ICD-10-CM | POA: Diagnosis present

## 2020-05-13 DIAGNOSIS — I5022 Chronic systolic (congestive) heart failure: Secondary | ICD-10-CM | POA: Diagnosis not present

## 2020-05-13 DIAGNOSIS — Z66 Do not resuscitate: Secondary | ICD-10-CM | POA: Diagnosis present

## 2020-05-13 DIAGNOSIS — Z888 Allergy status to other drugs, medicaments and biological substances status: Secondary | ICD-10-CM

## 2020-05-13 DIAGNOSIS — Z79899 Other long term (current) drug therapy: Secondary | ICD-10-CM | POA: Diagnosis not present

## 2020-05-13 DIAGNOSIS — I2699 Other pulmonary embolism without acute cor pulmonale: Secondary | ICD-10-CM

## 2020-05-13 DIAGNOSIS — W19XXXD Unspecified fall, subsequent encounter: Secondary | ICD-10-CM

## 2020-05-13 DIAGNOSIS — M797 Fibromyalgia: Secondary | ICD-10-CM | POA: Diagnosis not present

## 2020-05-13 DIAGNOSIS — M818 Other osteoporosis without current pathological fracture: Secondary | ICD-10-CM | POA: Diagnosis not present

## 2020-05-13 DIAGNOSIS — F0390 Unspecified dementia without behavioral disturbance: Secondary | ICD-10-CM | POA: Diagnosis present

## 2020-05-13 DIAGNOSIS — Z79811 Long term (current) use of aromatase inhibitors: Secondary | ICD-10-CM | POA: Diagnosis not present

## 2020-05-13 DIAGNOSIS — Z20822 Contact with and (suspected) exposure to covid-19: Secondary | ICD-10-CM | POA: Diagnosis present

## 2020-05-13 DIAGNOSIS — I97638 Postprocedural hematoma of a circulatory system organ or structure following other circulatory system procedure: Secondary | ICD-10-CM | POA: Diagnosis not present

## 2020-05-13 DIAGNOSIS — R609 Edema, unspecified: Secondary | ICD-10-CM | POA: Diagnosis not present

## 2020-05-13 LAB — CBC WITH DIFFERENTIAL/PLATELET
Abs Immature Granulocytes: 0.04 10*3/uL (ref 0.00–0.07)
Basophils Absolute: 0.1 10*3/uL (ref 0.0–0.1)
Basophils Relative: 1 %
Eosinophils Absolute: 0.1 10*3/uL (ref 0.0–0.5)
Eosinophils Relative: 1 %
HCT: 43.2 % (ref 36.0–46.0)
Hemoglobin: 14.6 g/dL (ref 12.0–15.0)
Immature Granulocytes: 0 %
Lymphocytes Relative: 17 %
Lymphs Abs: 1.6 10*3/uL (ref 0.7–4.0)
MCH: 33 pg (ref 26.0–34.0)
MCHC: 33.8 g/dL (ref 30.0–36.0)
MCV: 97.5 fL (ref 80.0–100.0)
Monocytes Absolute: 0.7 10*3/uL (ref 0.1–1.0)
Monocytes Relative: 7 %
Neutro Abs: 6.7 10*3/uL (ref 1.7–7.7)
Neutrophils Relative %: 74 %
Platelets: 220 10*3/uL (ref 150–400)
RBC: 4.43 MIL/uL (ref 3.87–5.11)
RDW: 13 % (ref 11.5–15.5)
WBC: 9.1 10*3/uL (ref 4.0–10.5)
nRBC: 0 % (ref 0.0–0.2)

## 2020-05-13 LAB — URINALYSIS, ROUTINE W REFLEX MICROSCOPIC
Bilirubin Urine: NEGATIVE
Glucose, UA: NEGATIVE mg/dL
Hgb urine dipstick: NEGATIVE
Ketones, ur: NEGATIVE mg/dL
Nitrite: POSITIVE — AB
Protein, ur: NEGATIVE mg/dL
Specific Gravity, Urine: 1.008 (ref 1.005–1.030)
pH: 6 (ref 5.0–8.0)

## 2020-05-13 LAB — COMPREHENSIVE METABOLIC PANEL
ALT: 30 U/L (ref 0–44)
AST: 30 U/L (ref 15–41)
Albumin: 2.5 g/dL — ABNORMAL LOW (ref 3.5–5.0)
Alkaline Phosphatase: 69 U/L (ref 38–126)
Anion gap: 13 (ref 5–15)
BUN: 9 mg/dL (ref 8–23)
CO2: 26 mmol/L (ref 22–32)
Calcium: 8.3 mg/dL — ABNORMAL LOW (ref 8.9–10.3)
Chloride: 104 mmol/L (ref 98–111)
Creatinine, Ser: 0.45 mg/dL (ref 0.44–1.00)
GFR, Estimated: >60 mL/min (ref >60)
Glucose, Bld: 132 mg/dL — ABNORMAL HIGH (ref 70–99)
Potassium: 2.8 mmol/L — ABNORMAL LOW (ref 3.5–5.1)
Sodium: 143 mmol/L (ref 135–145)
Total Bilirubin: 1.1 mg/dL (ref 0.3–1.2)
Total Protein: 5.1 g/dL — ABNORMAL LOW (ref 6.5–8.1)

## 2020-05-13 LAB — BRAIN NATRIURETIC PEPTIDE: B Natriuretic Peptide: 127.7 pg/mL — ABNORMAL HIGH (ref 0.0–100.0)

## 2020-05-13 LAB — CK: Total CK: 32 U/L — ABNORMAL LOW (ref 38–234)

## 2020-05-13 LAB — TROPONIN I (HIGH SENSITIVITY): Troponin I (High Sensitivity): 7 ng/L (ref <18)

## 2020-05-13 LAB — MAGNESIUM: Magnesium: 1.9 mg/dL (ref 1.7–2.4)

## 2020-05-13 LAB — RESP PANEL BY RT-PCR (FLU A&B, COVID) ARPGX2
Influenza A by PCR: NEGATIVE
Influenza B by PCR: NEGATIVE
SARS Coronavirus 2 by RT PCR: NEGATIVE

## 2020-05-13 MED ORDER — MORPHINE SULFATE (PF) 2 MG/ML IV SOLN
2.0000 mg | Freq: Once | INTRAVENOUS | Status: AC
  Administered 2020-05-13: 2 mg via INTRAVENOUS
  Filled 2020-05-13: qty 1

## 2020-05-13 MED ORDER — ACETAMINOPHEN 650 MG RE SUPP: 650.0000 mg | Freq: Four times a day (QID) | RECTAL | Status: DC | PRN

## 2020-05-13 MED ORDER — MORPHINE SULFATE (PF) 2 MG/ML IV SOLN
2.0000 mg | INTRAVENOUS | Status: DC | PRN
Start: 2020-05-13 — End: 2020-05-19

## 2020-05-13 MED ORDER — TRAMADOL HCL 50 MG PO TABS: 50.0000 mg | ORAL_TABLET | Freq: Four times a day (QID) | ORAL | Status: DC | PRN

## 2020-05-13 MED ORDER — ACETAMINOPHEN 325 MG PO TABS
650.0000 mg | ORAL_TABLET | Freq: Four times a day (QID) | ORAL | Status: DC | PRN
  Administered 2020-05-15: 650 mg via ORAL

## 2020-05-13 MED ORDER — ACETAMINOPHEN 325 MG PO TABS
650.0000 mg | ORAL_TABLET | Freq: Four times a day (QID) | ORAL | Status: DC
  Administered 2020-05-13 – 2020-05-19 (×19): 650 mg via ORAL
  Filled 2020-05-13 (×22): qty 2

## 2020-05-13 MED ORDER — ACETAMINOPHEN 325 MG PO TABS
650.0000 mg | ORAL_TABLET | Freq: Once | ORAL | Status: AC
  Administered 2020-05-13: 650 mg via ORAL
  Filled 2020-05-13: qty 2

## 2020-05-13 MED ORDER — POTASSIUM CHLORIDE CRYS ER 20 MEQ PO TBCR
20.0000 meq | EXTENDED_RELEASE_TABLET | Freq: Two times a day (BID) | ORAL | Status: DC
  Administered 2020-05-14: 20 meq via ORAL
  Filled 2020-05-13: qty 1

## 2020-05-13 MED ORDER — HYDROCODONE-ACETAMINOPHEN 5-325 MG PO TABS
1.0000 | ORAL_TABLET | ORAL | Status: DC | PRN
  Administered 2020-05-14 (×3): 1 via ORAL
  Filled 2020-05-13 (×3): qty 1

## 2020-05-13 MED ORDER — POTASSIUM CHLORIDE 10 MEQ/100ML IV SOLN
10.0000 meq | INTRAVENOUS | Status: AC
Start: 2020-05-13 — End: 2020-05-13
  Administered 2020-05-13 (×2): 10 meq via INTRAVENOUS
  Filled 2020-05-13 (×2): qty 100

## 2020-05-13 MED ORDER — ONDANSETRON HCL 4 MG/2ML IJ SOLN: 4.0000 mg | Freq: Four times a day (QID) | INTRAMUSCULAR | Status: DC | PRN

## 2020-05-13 MED ORDER — ONDANSETRON HCL 4 MG PO TABS: 4.0000 mg | ORAL_TABLET | Freq: Four times a day (QID) | ORAL | Status: DC | PRN

## 2020-05-13 MED ORDER — SODIUM CHLORIDE 0.9 % IV SOLN
1.0000 g | INTRAVENOUS | Status: DC
  Administered 2020-05-13 – 2020-05-15 (×3): 1 g via INTRAVENOUS
  Filled 2020-05-13 (×3): qty 10

## 2020-05-13 MED ORDER — IOHEXOL 300 MG/ML  SOLN
75.0000 mL | Freq: Once | INTRAMUSCULAR | Status: AC | PRN
  Administered 2020-05-13: 75 mL via INTRAVENOUS

## 2020-05-13 MED ORDER — DOCUSATE SODIUM 100 MG PO CAPS
100.0000 mg | ORAL_CAPSULE | Freq: Two times a day (BID) | ORAL | Status: DC
  Administered 2020-05-14 – 2020-05-17 (×7): 100 mg via ORAL
  Filled 2020-05-13 (×10): qty 1

## 2020-05-13 MED ORDER — BACITRACIN ZINC 500 UNIT/GM EX OINT
TOPICAL_OINTMENT | CUTANEOUS | Status: AC
  Filled 2020-05-13: qty 0.9

## 2020-05-13 NOTE — H&P (Addendum)
History and Physical    ANECIA NUSBAUM [name]:[id number] DOB: 1930/01/11 DOA: 05/13/2020  PCP: System, Provider Not In  Patient coming from: [facility]  Chief Complaint: Fall  HPI: Becky [name] is a 87 y.o. female with medical history significant of dementia, anxiety/depression. Presenting after an unwitnessed fall. Patient with dementia, thus is a poor historian. She tells me she doesn't know why she fell and is unable to remember the fall at all. She is aware that she hit her head and that he left side pain started after her fall. She was found by her facility staff. They called for EMS to transport her to the ED. She denies any other aggravating or alleviating factors.    ED Course: She was found to have multiple rib fractures. She was found to have a low potassium level. Trauma was consulted. TRH was called for admission.   Review of Systems:  Denies CP, dyspnea, palpitations, N/V/D. Reports left axillary/back pain. Review of systems is otherwise negative for all not mentioned in HPI.   PMHx Past Medical History:  Diagnosis Date  . Arthritis   . COPD (chronic obstructive pulmonary disease) ([place])   . Dementia ([place])   . Depression    and anxiety  . Fibromyalgia   . Fuchs' corneal dystrophy    L eye  . Glaucoma    R eye  . Hyperlipidemia   . Migraines   . Osteoporosis   . Polio    PPS  (age 73 yr)  . Scoliosis   . Ulcer    small- stomach    PSHx Past Surgical History:  Procedure Laterality Date  . BREAST LUMPECTOMY WITH RADIOACTIVE SEED LOCALIZATION Right 07/15/2019   Procedure: RADIOACTIVE SEED GUIDED RIGHT BREAST LUMPECTOMY;  Surgeon: Erroll Luna, MD;  Location: [facility];  Service: General;  Laterality: Right;  . inguinal herniorrhaphy    . TUBAL LIGATION      SocHx  reports that she has never smoked. She has never used smokeless tobacco. She reports that she does not drink alcohol and does not use drugs.  Allergies  Allergen Reactions  . Brimonidine     Unknown     FamHx Family History  Problem Relation Age of Onset  . Arthritis Brother   . Cancer Brother        esophageal  . Alcohol abuse Other        addiction   . Arthritis Other   . Colon cancer Other        1st degree relative <60  . Diabetes Other        1st degree relative  . Psychosis Other     Prior to Admission medications   Medication Sig Start Date End Date Taking? Authorizing Provider  acetaminophen (MAPAP) 500 MG tablet Take 500 mg by mouth every 6 (six) hours as needed (for pain).    [provider]  anastrozole (ARIMIDEX) 1 MG tablet Take 1 tablet (1 mg total) by mouth daily. 08/15/19   Magrinat, Virgie Dad, MD  Cholecalciferol (VITAMIN D3) 5000 UNITS CAPS Take 10,000 Units by mouth every Tuesday.     [provider]  diclofenac Sodium (VOLTAREN) 1 % GEL Apply 2-4 g topically See admin instructions. Apply 4 grams to left knee daily as needed for pain & apply 2 grams to right shoulder twice daily as needed for pain.    [provider]  Eyelid Cleansers (OCUSOFT LID SCRUB EX) Place 1 application into both eyes daily.  [provider]  ipratropium (ATROVENT) 0.06 % nasal spray Place 2 sprays into both nostrils daily as needed (for clearing her throat due to post nasal drip).    [provider]  Memantine HCl-Donepezil HCl (NAMZARIC) 28-10 MG CP24 Take 1 capsule by mouth daily with breakfast. (0800)    [provider]  Multiple Vitamin (MULTIVITAMIN WITH MINERALS) TABS tablet Take 1 tablet by mouth daily. (0800)    [provider]  Nutritional Supplements (RA NUTRITIONAL SUPPORT PO) Take 120 mLs by mouth in the morning and at bedtime. Medpass    [provider]  Polyvinyl Alcohol-Povidone (REFRESH OP) Place 1 drop into both eyes in the morning, at noon, in the evening, and at bedtime.     [provider]  sertraline (ZOLOFT) 100 MG tablet Take 100 mg by mouth daily after breakfast. (0800)    [provider]  sodium chloride (ALTACHLORE) 5 % ophthalmic ointment Place 1 application into the left eye 4 (four) times daily.    [provider]  Travoprost, BAK Free, (TRAVATAN) 0.004 % SOLN ophthalmic solution Place 1 drop into the right eye at bedtime.    [provider]  White Petrolatum-Mineral Oil (GENTEAL TEARS NIGHT-TIME) OINT Place 1 application into the left eye as needed (fuchs' dystrophy).    [provider]  Zinc Oxide (SECURA PROTECTIVE EX) Apply 1 application topically 2 (two) times daily as needed (irritation.).    [provider]  zinc oxide 20 % ointment Apply 1 application topically 2 (two) times daily as needed for irritation.    [provider]    Physical Exam: Vitals:   05/13/20 0845 05/13/20 0900 05/13/20 0915 05/13/20 1008  BP:  125/72  137/61  Pulse: 84 85 71 75  Resp:    13  Temp:      TempSrc:      SpO2: 96% 95% 95% 96%    General: 87 y.o. female resting in bed in NAD Eyes: PERRL, normal sclera ENMT: Nares patent w/o discharge, orophaynx clear, dentition normal, ears w/o discharge/lesions/ulcers. old blood with contusion on left side of head posterior to ear  Neck: Supple, trachea midline Cardiovascular: RRR, +S1, S2, no m/g/r, equal pulses throughout Respiratory: CTABL, no w/r/r, normal WOB GI: BS+, NDNT, no masses noted, no organomegaly noted MSK: No e/c/c; tender to palpation of left lower posterior ribs Neuro: A&O x 2 (name, place), no focal deficits Psyc: Appropriate interaction and affect, calm/cooperative  Labs on Admission: I have personally reviewed following labs and imaging studies  CBC: Recent Labs  Lab 05/13/20 0719  WBC 9.1  NEUTROABS 6.7  HGB 14.6  HCT 43.2  MCV 97.5  PLT 767   Basic Metabolic Panel: Recent Labs  Lab 05/13/20 0719  NA 143  K 2.8*  CL 104  CO2 26  GLUCOSE 132*  BUN 9  CREATININE 0.45  CALCIUM 8.3*  MG 1.9   GFR: CrCl cannot be calculated (Unknown ideal  weight.). Liver Function Tests: Recent Labs  Lab 05/13/20 0719  AST 30  ALT 30  ALKPHOS 69  BILITOT 1.1  PROT 5.1*  ALBUMIN 2.5*   No results for input(s): LIPASE, AMYLASE in the last 168 hours. No results for input(s): AMMONIA in the last 168 hours. Coagulation Profile: No results for input(s): INR, PROTIME in the last 168 hours. Cardiac Enzymes: Recent Labs  Lab 05/13/20 0719  CKTOTAL 32*   BNP (last 3 results) No results for input(s): PROBNP in the last 8760  hours. HbA1C: No results for input(s): HGBA1C in the last 72 hours. CBG: No results for input(s): GLUCAP in the last 168 hours. Lipid Profile: No results for input(s): CHOL, HDL, LDLCALC, TRIG, CHOLHDL, LDLDIRECT in the last 72 hours. Thyroid Function Tests: No results for input(s): TSH, T4TOTAL, FREET4, T3FREE, THYROIDAB in the last 72 hours. Anemia Panel: No results for input(s): VITAMINB12, FOLATE, FERRITIN, TIBC, IRON, RETICCTPCT in the last 72 hours. Urine analysis:    Component Value Date/Time   COLORURINE YELLOW 04/17/2010 1448   APPEARANCEUR CLOUDY (A) 04/17/2010 1448   LABSPEC 1.008 04/17/2010 1448   PHURINE 6.0 04/17/2010 1448   HGBUR NEGATIVE 04/17/2010 1448   HGBUR negative 08/19/2008 0000   BILIRUBINUR n 08/12/2013 1700   KETONESUR NEGATIVE 04/17/2010 1448   PROTEINUR n 08/12/2013 1700   PROTEINUR NEGATIVE 04/17/2010 1448   UROBILINOGEN 0.2 08/12/2013 1700   UROBILINOGEN 1.0 04/17/2010 1448   NITRITE n 08/12/2013 1700   NITRITE NEGATIVE 04/17/2010 1448   LEUKOCYTESUR moderate (2+) 08/12/2013 1700    Radiological Exams on Admission: DG Chest 2 View  Result Date: 05/13/2020 CLINICAL DATA:  Rales. EXAM: CHEST - 2 VIEW COMPARISON:  04/20/2019 FINDINGS: Left base atelectasis or infiltrate noted with probable small left pleural effusion. Skin fold overlies the lower left hemithorax. Right lung clear. Cardiopericardial silhouette is at upper limits of normal for size. Degenerative changes noted in  both shoulders. Telemetry leads overlie the chest. IMPRESSION: Left base atelectasis or infiltrate with probable small left pleural effusion. Electronically Signed   By: Misty Stanley M.D.   On: 05/13/2020 08:06   DG Ribs Unilateral Left  Result Date: 05/13/2020 CLINICAL DATA:  Unwitnessed fall left-sided pain EXAM: LEFT RIBS - 2 VIEW COMPARISON:  Chest x-ray from earlier today FINDINGS: Left lateral seventh, eighth, ninth, tenth, and eleventh rib fractures. The ninth rib fracture is displaced. Atelectasis without pneumothorax or large pleural effusion. Thoracolumbar spine degeneration with scoliosis. IMPRESSION: Left seventh through eleventh rib fractures with up to mild displacement. Electronically Signed   By: Monte Fantasia M.D.   On: 05/13/2020 10:18   CT Head Wo Contrast  Result Date: 05/13/2020 CLINICAL DATA:  Unwitnessed fall EXAM: CT HEAD WITHOUT CONTRAST TECHNIQUE: Contiguous axial images were obtained from the base of the skull through the vertex without intravenous contrast. COMPARISON:  08/08/2016 FINDINGS: Brain: There is atrophy and chronic small vessel disease changes. No acute intracranial abnormality. Specifically, no hemorrhage, hydrocephalus, mass lesion, acute infarction, or significant intracranial injury. Vascular: No hyperdense vessel or unexpected calcification. Skull: No acute calvarial abnormality. Sinuses/Orbits: No acute findings Other: None IMPRESSION: Atrophy, chronic microvascular disease. No acute intracranial abnormality. Electronically Signed   By: Rolm Baptise M.D.   On: 05/13/2020 08:43   CT Cervical Spine Wo Contrast  Result Date: 05/13/2020 CLINICAL DATA:  Unwitnessed fall. EXAM: CT CERVICAL SPINE WITHOUT CONTRAST TECHNIQUE: Multidetector CT imaging of the cervical spine was performed without intravenous contrast. Multiplanar CT image reconstructions were also generated. COMPARISON:  08/08/2016 FINDINGS: Alignment: Slight anterolisthesis of C4 on C5 related to  facet disease. No subluxation. Skull base and vertebrae: No acute fracture. No primary bone lesion or focal pathologic process. Soft tissues and spinal canal: No prevertebral fluid or swelling. No visible canal hematoma. Disc levels: Diffuse advanced degenerative disc disease and facet disease. Upper chest: No acute findings Other: None IMPRESSION: Advanced cervical spondylosis. No acute bony abnormality. Electronically Signed   By: Rolm Baptise M.D.   On: 05/13/2020 08:45   DG Hip Unilat W  or Wo Pelvis 2-3 Views Left  Result Date: 05/13/2020 CLINICAL DATA:  Unwitnessed fall.  Pain. EXAM: DG HIP (WITH OR WITHOUT PELVIS) 2-3V LEFT COMPARISON:  None. FINDINGS: There is no evidence of hip fracture or dislocation. There is no evidence of arthropathy or other focal bone abnormality. IMPRESSION: Negative. Electronically Signed   By: Misty Stanley M.D.   On: 05/13/2020 08:04    EKG: Independently reviewed. Sinus, no st elevations; unchanged from previous  Assessment/Plan Unwitnessed Fall ?syncope Multiple rib fractures secondary to above     - place in obs, tele     - pain control     - EKG is stable, no chest pain noted     - check orthostatics     - check echo     - UA is dirty, so this is the likely culprit     - trauma team to review case, appreciate assistance     - CTH/c-spine negative for acute process  Possible UTI     - UCx pending     - cover with rocephin for now  Hypokalemia     - replace K+; Mg2+ is ok  Dementia Anxiety/depression     - continue home regimen once med list confirmed  Intramural hematoma in the aorta     - CT shows intramural hematoma in the aorta posteriorly involving the arch and proximal to mid descending aorta.      - trauma discussed case with vascular surg (Dr. Donzetta Matters); recommends transfer to [facility] for observation and rpt CT in 48 hours  DVT prophylaxis: SCDs  Code Status: FULL  Family Communication: w/ dtr at bedside  Consults called: EDP spoke with  trauma   Status is: Inpatient  Remains inpatient appropriate because:Inpatient level of care appropriate due to severity of illness   Dispo: The patient is from: SNF              Anticipated d/c is to: SNF              Patient currently is not medically stable to d/c.   Difficult to place patient No   Jonnie Finner DO [facility]  If 7PM-7AM, please contact night-coverage [email or web address]  05/13/2020, 10:36 AM

## 2020-05-13 NOTE — Consult Note (Signed)
Hospital Consult    Reason for Consult: Aortic intramural hematoma Referring Physician: Dr. Johney Maine MRN #:  [id number]  History of Present Illness: This is a 87 y.o. female history of dementia with unwitnessed fall found to have rib fractures on the left underwent CT scan which demonstrated aortic interval hematoma.  She does not have any known history of this.  At this time her pain is very well controlled.  She is accompanied by her daughter who is her historian.  Past Medical History:  Diagnosis Date  . Arthritis   . COPD (chronic obstructive pulmonary disease) ([place])   . Dementia ([place])   . Depression    and anxiety  . Fibromyalgia   . Fuchs' corneal dystrophy    L eye  . Glaucoma    R eye  . Hyperlipidemia   . Migraines   . Osteoporosis   . Polio    PPS  (age 89 yr)  . Scoliosis   . Ulcer    small- stomach    Past Surgical History:  Procedure Laterality Date  . BREAST LUMPECTOMY WITH RADIOACTIVE SEED LOCALIZATION Right 07/15/2019   Procedure: RADIOACTIVE SEED GUIDED RIGHT BREAST LUMPECTOMY;  Surgeon: Erroll Luna, MD;  Location: [facility];  Service: General;  Laterality: Right;  . inguinal herniorrhaphy    . TUBAL LIGATION      Allergies  Allergen Reactions  . Brimonidine     Unknown - Listed on MAR    Prior to Admission medications   Medication Sig Start Date End Date Taking? Authorizing Provider  acetaminophen (TYLENOL) 500 MG tablet Take 500 mg by mouth every 6 (six) hours as needed (for pain).   Yes [provider]  anastrozole (ARIMIDEX) 1 MG tablet Take 1 tablet (1 mg total) by mouth daily. 08/15/19  Yes Magrinat, Virgie Dad, MD  Cholecalciferol (VITAMIN D3) 125 MCG (5000 UT) TABS Take 10,000 Units by mouth every Tuesday.    Yes [provider]  cholestyramine (QUESTRAN) 4 g packet Take 1 packet by mouth in the morning and at bedtime. Mix with 8-oz of fluid 04/16/20  Yes [provider]  cyanocobalamin (,VITAMIN B-12,) 1000 MCG/ML  injection Inject 1,000 mcg into the muscle every 30 (thirty) days. On the 21st of each month 05/10/20  Yes [provider]  loperamide (IMODIUM A-D) 2 MG tablet Take 4 mg by mouth daily as needed for diarrhea or loose stools.   Yes [provider]  Multiple Vitamin (MULTIVITAMIN WITH MINERALS) TABS tablet Take 1 tablet by mouth daily.   Yes [provider]  Nutritional Supplements (RA NUTRITIONAL SUPPORT PO) Take 120 mLs by mouth in the morning and at bedtime. Medpass   Yes [provider]  Polyvinyl Alcohol-Povidone (REFRESH OP) Place 1 drop into both eyes in the morning, at noon, in the evening, and at bedtime.    Yes [provider]  predniSONE (DELTASONE) 2.5 MG tablet Take 2.5 mg by mouth daily. 05/05/20  Yes [provider]  sertraline (ZOLOFT) 50 MG tablet Take 50 mg by mouth daily. 04/16/20  Yes [provider]  sodium chloride (MURO 128) 5 % ophthalmic ointment Place 1 application into the left eye in the morning and at bedtime.   Yes [provider]  Travoprost, BAK Free, (TRAVATAN) 0.004 % SOLN ophthalmic solution Place 1 drop into the right eye at bedtime.   Yes [provider]    Social History   Socioeconomic History  . Marital status: Divorced  Spouse name: Not on file  . Number of children: 3  . Years of education: Nursing  . Highest education level: Not on file  Occupational History  . Occupation: Retired  Tobacco Use  . Smoking status: Never Smoker  . Smokeless tobacco: Never Used  Vaping Use  . Vaping Use: Never used  Substance and Sexual Activity  . Alcohol use: No  . Drug use: No  . Sexual activity: Never  Other Topics Concern  . Not on file  Social History Narrative   Patient lives at [facility]  Son is now [employer].   Patient has 3 children.    Patient has a nursing degree.    Patient is retired.    Patient is right handed.   Patient is divorced.    Caffeine  1 cup daily  avg. (almost)   Social Determinants of Health   Financial Resource Strain: Not on file  Food Insecurity: Not on file  Transportation Needs: Not on file  Physical Activity: Not on file  Stress: Not on file  Social Connections: Not on file  Intimate Partner Violence: Not on file     Family History  Problem Relation Age of Onset  . Arthritis Brother   . Cancer Brother        esophageal  . Alcohol abuse Other        addiction   . Arthritis Other   . Colon cancer Other        1st degree relative <60  . Diabetes Other        1st degree relative  . Psychosis Other     ROS:  Cardiovascular: []  chest pain/pressure []  palpitations []  SOB lying flat []  DOE []  pain in legs while walking []  pain in legs at rest []  pain in legs at night []  non-healing ulcers []  hx of DVT []  swelling in legs  Pulmonary: []  productive cough []  asthma/wheezing []  home O2  Neurologic: []  weakness in []  arms []  legs []  numbness in []  arms []  legs []  hx of CVA []  mini stroke [] difficulty speaking or slurred speech []  temporary loss of vision in one eye []  dizziness  Hematologic: []  hx of cancer []  bleeding problems []  problems with blood clotting easily  Endocrine:   []  diabetes []  thyroid disease  GI []  vomiting blood []  blood in stool  GU: []  CKD/renal failure []  HD--[]  M/W/F or []  T/T/S []  burning with urination []  blood in urine  Psychiatric: []  anxiety []  depression  Musculoskeletal: []  arthritis []  joint pain  Integumentary: []  rashes []  ulcers  Constitutional: []  fever []  chills   Physical Examination  Vitals:   05/13/20 1700 05/13/20 1709  BP: 112/84   Pulse: 71   Resp: (!) 22   Temp:  98.3 F (36.8 C)  SpO2: 93%    There is no height or weight on file to calculate BMI.  General: Awake and alert HENT: WNL, normocephalic Pulmonary: normal non-labored breathing, without Rales, rhonchi,  wheezing Cardiac: Palpable femoral and popliteal pulses,  regular rate and rhythm Abdomen: soft, NT/ND, no masses Extremities: Warm and well-perfused Musculoskeletal: no muscle wasting or atrophy  Neurologic: A&O X 3   CBC    Component Value Date/Time   WBC 9.1 05/13/2020 0719   RBC 4.43 05/13/2020 0719   HGB 14.6 05/13/2020 0719   HGB 15.1 (H) 06/11/2019 0841   HCT 43.2 05/13/2020 0719   PLT 220 05/13/2020 0719   PLT 211 06/11/2019 0841  MCV 97.5 05/13/2020 0719   MCH 33.0 05/13/2020 0719   MCHC 33.8 05/13/2020 0719   RDW 13.0 05/13/2020 0719   LYMPHSABS 1.6 05/13/2020 0719   MONOABS 0.7 05/13/2020 0719   EOSABS 0.1 05/13/2020 0719   BASOSABS 0.1 05/13/2020 0719    BMET    Component Value Date/Time   NA 143 05/13/2020 0719   K 2.8 (L) 05/13/2020 0719   CL 104 05/13/2020 0719   CO2 26 05/13/2020 0719   GLUCOSE 132 (H) 05/13/2020 0719   BUN 9 05/13/2020 0719   CREATININE 0.45 05/13/2020 0719   CREATININE 0.71 06/11/2019 0841   CALCIUM 8.3 (L) 05/13/2020 0719   GFRNONAA >60 05/13/2020 0719   GFRNONAA >60 06/11/2019 0841   GFRAA >60 08/15/2019 1321   GFRAA >60 06/11/2019 0841    COAGS: Lab Results  Component Value Date   INR 1.14 04/17/2010     Non-Invasive Vascular Imaging:   CT chest IMPRESSION: 1. Multiple rib fractures on the left. Small left pleural effusion but no pneumothorax.  2. Bibasilar atelectasis, more severe on the left than the right with questionable developing infiltrate left base.  3. Apparent intramural hematoma in the aorta posteriorly involving the arch and proximal to mid descending aorta. No aneurysm or dissection. No mediastinal hematoma. There is aortic atherosclerosis.  4.  No adenopathy.  5.  Paraesophageal hernia again noted.  6.  Hepatic steatosis.  7. Fairly small amount of pericardial fluid superiorly, stable compared to the February 2021 study.   ASSESSMENT/PLAN: This is a 87 y.o. female here with unwitnessed fall she does remember some of this.  She is not a very  good historian but is accompanied by family member.  She has no previous history of aortic disease but CT scan has IMH with the distal arch proximal descending thoracic aorta.  I discussed with the family the plan will be pain control as well as blood pressure and heart rate control which are currently in good range.  We will plan to rescan her with CT angio chest abdomen pelvis this weekend.  Patient and family understanding of plan.  Brandon C. Donzetta Matters, MD [facility] Office: [phone] Pager: [phone]

## 2020-05-13 NOTE — ED Provider Notes (Signed)
[facility] DEPT Provider Note   CSN: [id number] Arrival date & time: 05/13/20  0175     History Chief Complaint  Patient presents with   Becky [name] is a 87 y.o. female who presents via EMS for unwitnessed fall at her facility, she lives at [facility] facility.  Patient is not on blood thinners.  C-collar is in place, skin tears to left elbow, blood on left scalp.  Patient is unable to provide any insight into her fall, states she is not hurting anywhere.  She is not on any anticoagulation.  Per EMS patient is at her baseline cognitively.  LEVEL 5 CAVEAT due to patient's underlying dementia.   HPI     Past Medical History:  Diagnosis Date   Arthritis    COPD (chronic obstructive pulmonary disease) ([place])    Dementia ([place])    Depression    and anxiety   Fibromyalgia    Fuchs' corneal dystrophy    L eye   Glaucoma    R eye   Hyperlipidemia    Migraines    Osteoporosis    Polio    PPS  (age 60 yr)   Scoliosis    Ulcer    small- stomach    Patient Active Problem List   Diagnosis Date Noted   Fall 05/13/2020   Malignant neoplasm of upper-outer quadrant of right breast in female, estrogen receptor positive ([place]) 06/05/2019   Osteoporosis 05/03/2015   MDD (major depressive disorder) 05/03/2015   Glaucoma 05/03/2015   Fuchs' corneal dystrophy 05/03/2015   Dementia arising in the senium and presenium ([place]) 05/25/2014   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     Family History  Problem Relation Age of Onset   Arthritis Brother     Cancer Brother        esophageal   Alcohol abuse Other        addiction    Arthritis Other    Colon cancer Other        1st degree relative <60   Diabetes Other        1st degree relative   Psychosis Other     Social History   Tobacco Use   Smoking status: Never Smoker   Smokeless tobacco: Never Used  Vaping Use   Vaping Use: Never used  Substance Use Topics   Alcohol use: No   Drug use: No    Home Medications Prior to Admission medications   Medication Sig Start Date End Date Taking? Authorizing Provider  acetaminophen (MAPAP) 500 MG tablet Take 500 mg by mouth every 6 (six) hours as needed (for pain).    [provider]  anastrozole (ARIMIDEX) 1 MG tablet Take 1 tablet (1 mg total) by mouth daily. 08/15/19   Magrinat, Virgie Dad, MD  Cholecalciferol (VITAMIN D3) 5000 UNITS CAPS Take 10,000 Units by mouth every Tuesday.     [provider]  diclofenac Sodium (VOLTAREN) 1 % GEL Apply 2-4 g topically See admin instructions. Apply 4 grams to left knee daily as needed for pain & apply 2  grams to right shoulder twice daily as needed for pain.    [provider]  Eyelid Cleansers (OCUSOFT LID SCRUB EX) Place 1 application into both eyes daily.    [provider]  ipratropium (ATROVENT) 0.06 % nasal spray Place 2 sprays into both nostrils daily as needed (for clearing her throat due to post nasal drip).    [provider]  Memantine HCl-Donepezil HCl (NAMZARIC) 28-10 MG CP24 Take 1 capsule by mouth daily with breakfast. (0800)    [provider]  Multiple Vitamin (MULTIVITAMIN WITH MINERALS) TABS tablet Take 1 tablet by mouth daily. (0800)    [provider]  Nutritional Supplements (RA NUTRITIONAL SUPPORT PO) Take 120 mLs by mouth in the morning and at bedtime. Medpass    [provider]  Polyvinyl Alcohol-Povidone (REFRESH OP) Place 1 drop into both eyes in the morning, at noon, in the evening, and at  bedtime.     [provider]  sertraline (ZOLOFT) 100 MG tablet Take 100 mg by mouth daily after breakfast. (0800)    [provider]  sodium chloride (ALTACHLORE) 5 % ophthalmic ointment Place 1 application into the left eye 4 (four) times daily.    [provider]  Travoprost, BAK Free, (TRAVATAN) 0.004 % SOLN ophthalmic solution Place 1 drop into the right eye at bedtime.    [provider]  White Petrolatum-Mineral Oil (GENTEAL TEARS NIGHT-TIME) OINT Place 1 application into the left eye as needed (fuchs' dystrophy).    [provider]  Zinc Oxide (SECURA PROTECTIVE EX) Apply 1 application topically 2 (two) times daily as needed (irritation.).    [provider]  zinc oxide 20 % ointment Apply 1 application topically 2 (two) times daily as needed for irritation.    [provider]    Allergies    Brimonidine  Review of Systems   Review of Systems  Unable to perform ROS: Dementia    Physical Exam Updated Vital Signs BP 137/61 (BP Location: Right Arm)    Pulse 75    Temp 97.9 F (36.6 C) (Oral)    Resp 13    SpO2 96%   Physical Exam Vitals and nursing note reviewed.  Constitutional:      Interventions: Cervical collar in place.  HENT:     Head: Normocephalic and atraumatic.      Comments: No step-off or hematoma    Right Ear: No hemotympanum.     Left Ear: No hemotympanum.     Nose: Nose normal.     Mouth/Throat:     Mouth: Mucous membranes are moist.     Pharynx: Oropharynx is clear. Uvula midline. No oropharyngeal exudate or posterior oropharyngeal erythema.     Tonsils: No tonsillar exudate.  Eyes:     General: Lids are normal. Vision grossly intact.        Right eye: No discharge.        Left eye: No discharge.     Extraocular Movements: Extraocular movements intact.     Conjunctiva/sclera: Conjunctivae normal.     Pupils: Pupils are equal, round, and reactive to light.  Neck:     Trachea: Trachea and  phonation normal.     Comments: Normal cervical ROM after removal of collar Cardiovascular:     Rate and Rhythm: Normal rate. Rhythm irregularly irregular.     Pulses: Normal pulses.     Heart sounds: Normal heart sounds. No murmur heard.   Pulmonary:     Effort:  Pulmonary effort is normal. No respiratory distress.     Breath sounds: Examination of the left-lower field reveals decreased breath sounds and rales. Decreased breath sounds and rales present. No wheezing.    Chest:     Chest wall: No mass, lacerations, deformity, swelling, tenderness, crepitus or edema.  Abdominal:     General: Bowel sounds are normal. There is no distension.     Palpations: Abdomen is soft.     Tenderness: There is no abdominal tenderness. There is no guarding or rebound.  Musculoskeletal:        General: No deformity.     Right shoulder: Normal.     Left shoulder: Normal.     Right upper arm: Normal.     Left upper arm: Normal.     Right elbow: Normal.     Left elbow: Laceration present. Normal range of motion. Tenderness present.     Right forearm: Normal.     Left forearm: Normal.     Right wrist: Normal.     Left wrist: Normal.     Right hand: Normal.     Left hand: Normal.       Arms:     Cervical back: Normal, normal range of motion and neck supple. No tenderness, bony tenderness or crepitus. No spinous process tenderness or muscular tenderness.     Thoracic back: Spasms and tenderness present. No bony tenderness.     Lumbar back: Normal. No bony tenderness.     Right lower leg: No edema.     Left lower leg: No edema.  Lymphadenopathy:     Cervical: No cervical adenopathy.  Skin:    General: Skin is warm and dry.     Capillary Refill: Capillary refill takes less than 2 seconds.  Neurological:     Mental Status: She is alert. Mental status is at baseline.     Cranial Nerves: Cranial nerves are intact.     Sensory: Sensation is intact.     Motor: Motor function is intact.   Psychiatric:        Mood and Affect: Mood normal.     ED Results / Procedures / Treatments   Labs (all labs ordered are listed, but only abnormal results are displayed) Labs Reviewed  BRAIN NATRIURETIC PEPTIDE - Abnormal; Notable for the following components:      Result Value   B Natriuretic Peptide 127.7 (*)    All other components within normal limits  URINALYSIS, ROUTINE W REFLEX MICROSCOPIC - Abnormal; Notable for the following components:   APPearance HAZY (*)    Nitrite POSITIVE (*)    Leukocytes,Ua MODERATE (*)    Bacteria, UA MANY (*)    All other components within normal limits  CK - Abnormal; Notable for the following components:   Total CK 32 (*)    All other components within normal limits  COMPREHENSIVE METABOLIC PANEL - Abnormal; Notable for the following components:   Potassium 2.8 (*)    Glucose, Bld 132 (*)    Calcium 8.3 (*)    Total Protein 5.1 (*)    Albumin 2.5 (*)    All other components within normal limits  URINE CULTURE  RESP PANEL BY RT-PCR (FLU A&B, COVID) ARPGX2  CBC WITH DIFFERENTIAL/PLATELET  MAGNESIUM  TROPONIN I (HIGH SENSITIVITY)    EKG EKG Interpretation  Date/Time:  Thursday May 13 2020 06:23:44 EDT Ventricular Rate:  76 PR Interval:    QRS Duration: 147 QT Interval:  456 QTC Calculation: 513  R Axis:   -31 Text Interpretation: Sinus rhythm Atrial premature complex Prolonged PR interval Left bundle branch block Confirmed by Dene Gentry [phone]) on 05/13/2020 7:17:42 AM   Radiology DG Chest 2 View  Result Date: 05/13/2020 CLINICAL DATA:  Rales. EXAM: CHEST - 2 VIEW COMPARISON:  04/20/2019 FINDINGS: Left base atelectasis or infiltrate noted with probable small left pleural effusion. Skin fold overlies the lower left hemithorax. Right lung clear. Cardiopericardial silhouette is at upper limits of normal for size. Degenerative changes noted in both shoulders. Telemetry leads overlie the chest. IMPRESSION: Left base atelectasis or  infiltrate with probable small left pleural effusion. Electronically Signed   By: Misty Stanley M.D.   On: 05/13/2020 08:06   DG Ribs Unilateral Left  Result Date: 05/13/2020 CLINICAL DATA:  Unwitnessed fall left-sided pain EXAM: LEFT RIBS - 2 VIEW COMPARISON:  Chest x-ray from earlier today FINDINGS: Left lateral seventh, eighth, ninth, tenth, and eleventh rib fractures. The ninth rib fracture is displaced. Atelectasis without pneumothorax or large pleural effusion. Thoracolumbar spine degeneration with scoliosis. IMPRESSION: Left seventh through eleventh rib fractures with up to mild displacement. Electronically Signed   By: Monte Fantasia M.D.   On: 05/13/2020 10:18   CT Head Wo Contrast  Result Date: 05/13/2020 CLINICAL DATA:  Unwitnessed fall EXAM: CT HEAD WITHOUT CONTRAST TECHNIQUE: Contiguous axial images were obtained from the base of the skull through the vertex without intravenous contrast. COMPARISON:  08/08/2016 FINDINGS: Brain: There is atrophy and chronic small vessel disease changes. No acute intracranial abnormality. Specifically, no hemorrhage, hydrocephalus, mass lesion, acute infarction, or significant intracranial injury. Vascular: No hyperdense vessel or unexpected calcification. Skull: No acute calvarial abnormality. Sinuses/Orbits: No acute findings Other: None IMPRESSION: Atrophy, chronic microvascular disease. No acute intracranial abnormality. Electronically Signed   By: Rolm Baptise M.D.   On: 05/13/2020 08:43   CT Cervical Spine Wo Contrast  Result Date: 05/13/2020 CLINICAL DATA:  Unwitnessed fall. EXAM: CT CERVICAL SPINE WITHOUT CONTRAST TECHNIQUE: Multidetector CT imaging of the cervical spine was performed without intravenous contrast. Multiplanar CT image reconstructions were also generated. COMPARISON:  08/08/2016 FINDINGS: Alignment: Slight anterolisthesis of C4 on C5 related to facet disease. No subluxation. Skull base and vertebrae: No acute fracture. No primary bone  lesion or focal pathologic process. Soft tissues and spinal canal: No prevertebral fluid or swelling. No visible canal hematoma. Disc levels: Diffuse advanced degenerative disc disease and facet disease. Upper chest: No acute findings Other: None IMPRESSION: Advanced cervical spondylosis. No acute bony abnormality. Electronically Signed   By: Rolm Baptise M.D.   On: 05/13/2020 08:45   DG Hip Unilat W or Wo Pelvis 2-3 Views Left  Result Date: 05/13/2020 CLINICAL DATA:  Unwitnessed fall.  Pain. EXAM: DG HIP (WITH OR WITHOUT PELVIS) 2-3V LEFT COMPARISON:  None. FINDINGS: There is no evidence of hip fracture or dislocation. There is no evidence of arthropathy or other focal bone abnormality. IMPRESSION: Negative. Electronically Signed   By: Misty Stanley M.D.   On: 05/13/2020 08:04    Procedures Procedures   Medications Ordered in ED Medications  cefTRIAXone (ROCEPHIN) 1 g in sodium chloride 0.9 % 100 mL IVPB (1 g Intravenous New Bag/Given 05/13/20 1113)  potassium chloride 10 mEq in 100 mL IVPB (10 mEq Intravenous New Bag/Given 05/13/20 1015)  acetaminophen (TYLENOL) tablet 650 mg (650 mg Oral Given 05/13/20 1014)  morphine 2 MG/ML injection 2 mg (2 mg Intravenous Given 05/13/20 1012)  bacitracin 500 UNIT/GM ointment (  Given 05/13/20 1020)  ED Course  I have reviewed the triage vital signs and the nursing notes.  Pertinent labs & imaging results that were available during my care of the patient were reviewed by me and considered in my medical decision making (see chart for details).  Clinical Course as of 05/13/20 1116  Thu May 13, 2020  1035 Consult to hospitalist, Dr. Marylyn Ishihara, who is agreeable to seeing this patient admitting her to his service.  At his recommendation we will also consult trauma surgery to evaluate this patient.  Appreciate his collaboration of care of the patient. [RS]  1046 Consult to trauma surgery, who is agreeable to seeing this patient. Recommending CT chest with contrast  for further evaluation. I appreciate their collaboration in the care of this patient.  [RS]    Clinical Course User Index [RS] Jerl Munyan, Sharlene Dory   MDM Rules/Calculators/A&P                         87 year old female who presents after unwitnessed fall at her facility.  The differential for syncope is extensive and includes, but is not limited to: arrythmia (Vtach, SVT, SSS, sinus arrest, AV block, bradycardia), aortic stenosis, AMI, hypertrophic obstructive cardiomyopathy (HOCM), PE, atrial myxoma, pulmonary hypertension, orthostatic hypotension, hypovolemia, drug effect, GB syndrome, micturition, cough, carotid sinus sensitivity, seizure, TIA/CVA, hypoglycemia, vertigo.  Vital signs are normal on intake, cardiac exam revealed irregularly irregular rhythm, pulmonary exam is significant for very mild rales in the left lung base, tenderness palpation of the left lateral/posterior inferior ribs without bruising or crepitus.  Abdominal exam is benign.  Patient is neurovascular intact all 4 extremities, and is at her baseline cognitively. Small skin tear over the left elbow and skin tear in the left temple without step-off or hematoma.  Will proceed with evaluation for syncopal work-up given unwitnessed fall and patient was unable to contribute history.  CBC unremarkable, CMP with hypokalemia of 2.8.  EKG without STEMI.  Magnesium is normal, troponin is negative, 7, CK negative, BNP very mildly elevated.  UA pending.  CT head and cervical spine negative for acute fracture, dislocation, or intracranial abnormality.  Chest x-ray revealed left base atelectasis versus infiltrate with probable small left pleural effusion.  Plain film of the left hip and pelvis was negative for acute fracture or dislocation.  Cervical collar was removed with full range of motion of the patient's neck without difficulty.  Once collar was removed patient began to endorse left-sided rib pain.  Plain films were obtained  which revealed left-sided seventh through 11th rib fractures.  Given extent of fractures, feels patient would benefit from admission to the hospital.  Consult placed to hospitalist and trauma surgery as above.  Disposition plan discussed with the patient and her daughter, Treasa School, was at the bedside.  They voiced understanding of her medical evaluation and treatment plan.  Each of their questions was answered to their expressed satisfaction.  They are amenable to plan for admission at this time.  This chart was dictated using voice recognition software, Dragon. Despite the best efforts of this provider to proofread and correct errors, errors may still occur which can change documentation meaning.  Final Clinical Impression(s) / ED Diagnoses Final diagnoses:  Fall    Rx / DC Orders ED Discharge Orders    None       Aura Dials 05/13/20 1116    Valarie Merino, MD 05/14/20 [phone]

## 2020-05-13 NOTE — ED Triage Notes (Signed)
Pt arrived via EMS from [place], unwitnessed fall, no blood thinners. C/o pain all over.  C-collar in place.   Aox2 baseline.

## 2020-05-13 NOTE — TOC CAGE-AID Note (Signed)
Transition of Care [facility]) - CAGE-AID Screening   Patient Details  Name: Becky [name] MRN: [id number] Date of Birth: 05-Jul-1929   Clinical Narrative:  Patient with history of dementia, denies any alcohol or drug use.  CAGE-AID Screening:    Have You Ever Felt You Ought to Cut Down on Your Drinking or Drug Use?: No Have People Annoyed You By Critizing Your Drinking Or Drug Use?: No Have You Felt Bad Or Guilty About Your Drinking Or Drug Use?: No Have You Ever Had a Drink or Used Drugs First Thing In The Morning to Steady Your Nerves or to Get Rid of a Hangover?: No CAGE-AID Score: 0  Substance Abuse Education Offered: No

## 2020-05-13 NOTE — Consult Note (Signed)
Becky [name] [birth year]  [id number].    Requesting MD: Dr. Cherylann Ratel Chief Complaint/Reason for Consult: fall with rib fractures  HPI:  This is an 87 yo white female with a recent history of breast cancer, s/p lumpectomy on oral chemotherapy (Arimidex), dementia, lives in [facility], anxiety/depression, who had an unwitnessed fall this morning, presumably.  She states that she remembers falling, but does not recall what made her fall, syncope vs tripping etc.  The facility found her and called EMS per her daughter.  Upon arrival to the [facility] she was complaining of left sided chest pain.  She got an initial CXR which was normal.  She underwent a CT of her Head/neck as she did hit her head and has a small abrasion.  These were negative.  Her neck was cleared.  Because of persistent lateral posterior left chest pain a rib film was obtained which revealed rib fractures 7-11.  The medicine team was asked to admit and we have been asked to consult.  Prior to seeing her, we recommended she get a CT of her chest given her age and injuries on her CXR.  The patient denies any central chest pain, SOB, HA, neck pain, extremity pain, or anything else bothering her.  She does use a walker sometimes to mobilize, but at times walks on her own.  She recently moved to the SNF portion from ALF as the ALF side closed.  Daughter states the patient's dementia is "fair."  ROS: ROS: Please see HPI, otherwise all other systems have been reviewed and are negative.  Family History  Problem Relation Age of Onset  . Arthritis Brother   . Cancer Brother        esophageal  . Alcohol abuse Other        addiction   . Arthritis Other   . Colon cancer Other        1st degree relative <60  . Diabetes Other        1st degree relative  . Psychosis Other     Past Medical History:  Diagnosis Date  . Arthritis   . COPD (chronic obstructive pulmonary disease) ([place])   . Dementia ([place])   . Depression    and anxiety   . Fibromyalgia   . Fuchs' corneal dystrophy    L eye  . Glaucoma    R eye  . Hyperlipidemia   . Migraines   . Osteoporosis   . Polio    PPS  (age 51 yr)  . Scoliosis   . Ulcer    small- stomach    Past Surgical History:  Procedure Laterality Date  . BREAST LUMPECTOMY WITH RADIOACTIVE SEED LOCALIZATION Right 07/15/2019   Procedure: RADIOACTIVE SEED GUIDED RIGHT BREAST LUMPECTOMY;  Surgeon: Erroll Luna, MD;  Location: [place];  Service: General;  Laterality: Right;  . inguinal herniorrhaphy    . TUBAL LIGATION      Social History:  reports that she has never smoked. She has never used smokeless tobacco. She reports that she does not drink alcohol and does not use drugs.  Allergies:  Allergies  Allergen Reactions  . Brimonidine     Unknown    (Not in a hospital admission)    Physical Exam: Blood pressure 137/61, pulse 75, temperature 97.9 F (36.6 C), temperature source Oral, resp. rate 13, SpO2 96 %. General: pleasant, WD, WN white female who is laying in bed in NAD and appears stated age. HEENT: head is  normocephalic, but has a small abrasion to the left parietal scalp that is not bleeding and appears almost more like a skin tear.  Sclera are noninjected.  PERRL.  Ears and nose without any masses or lesions.  Mouth is pink and moist. Neck: normal ROM, no midline bony tenderness, trachea midline Heart: regular, rate, and rhythm with what seems like some ectopy intermittently.  Normal s1,s2. No obvious murmurs, gallops, or rubs noted.  Palpable radial and pedal pulses bilaterally Lungs/chest: CTAB, no wheezes, rhonchi, or rales noted.  Respiratory effort nonlabored on RA, sats 94%.  Chest wall tenderness on posterior left lateral chest wall.  No ecchymosis or abrasions noted. Abd: soft, NT, ND, +BS, no masses, hernias, or organomegaly MS: all 4 extremities are symmetrical with no cyanosis, clubbing, or edema.  Normal ROM of all extremities.  No bony midline tenderness  down spine.  No stepoffs or other injuries noted Skin: warm and dry with no masses, lesions, or rashes Neuro: Cranial nerves 2-12 grossly intact, sensation is normal throughout Psych: alert and cooperative.  Answers questions when asked as best as she can, but some forgetfulness noted.   Results for orders placed or performed during the hospital encounter of 05/13/20 (from the past 48 hour(s))  Brain natriuretic peptide     Status: Abnormal   Collection Time: 05/13/20  7:19 AM  Result Value Ref Range   B Natriuretic Peptide 127.7 (H) 0.0 - 100.0 pg/mL    Comment: Performed at [facility], [place] [address], [place], [place] [address]  Troponin I (High Sensitivity)     Status: None   Collection Time: 05/13/20  7:19 AM  Result Value Ref Range   Troponin I (High Sensitivity) 7 <18 ng/L    Comment: (NOTE) Elevated high sensitivity troponin I (hsTnI) values and significant  changes across serial measurements may suggest ACS but many other  chronic and acute conditions are known to elevate hsTnI results.  Refer to the Links section for chest pain algorithms and additional  guidance. Performed at [facility], [place] [address] [place], [place] [address]   CBC with Differential     Status: None   Collection Time: 05/13/20  7:19 AM  Result Value Ref Range   WBC 9.1 4.0 - 10.5 K/uL   RBC 4.43 3.87 - 5.11 MIL/uL   Hemoglobin 14.6 12.0 - 15.0 g/dL   HCT 43.2 36.0 - 46.0 %   MCV 97.5 80.0 - 100.0 fL   MCH 33.0 26.0 - 34.0 pg   MCHC 33.8 30.0 - 36.0 g/dL   RDW 13.0 11.5 - 15.5 %   Platelets 220 150 - 400 K/uL   nRBC 0.0 0.0 - 0.2 %   Neutrophils Relative % 74 %   Neutro Abs 6.7 1.7 - 7.7 K/uL   Lymphocytes Relative 17 %   Lymphs Abs 1.6 0.7 - 4.0 K/uL   Monocytes Relative 7 %   Monocytes Absolute 0.7 0.1 - 1.0 K/uL   Eosinophils Relative 1 %   Eosinophils Absolute 0.1 0.0 - 0.5 K/uL   Basophils Relative 1 %   Basophils Absolute 0.1 0.0 - 0.1 K/uL    Immature Granulocytes 0 %   Abs Immature Granulocytes 0.04 0.00 - 0.07 K/uL    Comment: Performed at [facility], [place] [address], [place], [place] [address]  CK     Status: Abnormal   Collection Time: 05/13/20  7:19 AM  Result Value Ref Range   Total CK  32 (L) 38 - 234 U/L    Comment: Performed at [facility], [place] [address], [place], [place] [address]  Comprehensive metabolic panel     Status: Abnormal   Collection Time: 05/13/20  7:19 AM  Result Value Ref Range   Sodium 143 135 - 145 mmol/L   Potassium 2.8 (L) 3.5 - 5.1 mmol/L   Chloride 104 98 - 111 mmol/L   CO2 26 22 - 32 mmol/L   Glucose, Bld 132 (H) 70 - 99 mg/dL    Comment: Glucose reference range applies only to samples taken after fasting for at least 8 hours.   BUN 9 8 - 23 mg/dL   Creatinine, Ser 0.45 0.44 - 1.00 mg/dL   Calcium 8.3 (L) 8.9 - 10.3 mg/dL   Total Protein 5.1 (L) 6.5 - 8.1 g/dL   Albumin 2.5 (L) 3.5 - 5.0 g/dL   AST 30 15 - 41 U/L   ALT 30 0 - 44 U/L   Alkaline Phosphatase 69 38 - 126 U/L   Total Bilirubin 1.1 0.3 - 1.2 mg/dL   GFR, Estimated >60 >60 mL/min    Comment: (NOTE) Calculated using the CKD-EPI Creatinine Equation (2021)    Anion gap 13 5 - 15    Comment: Performed at [facility], [place] [address] [place], [place] [address]  Magnesium     Status: None   Collection Time: 05/13/20  7:19 AM  Result Value Ref Range   Magnesium 1.9 1.7 - 2.4 mg/dL    Comment: Performed at [facility], [place] [address] [place], [place] [address]  Urinalysis, Routine w reflex microscopic Urine, Clean Catch     Status: Abnormal   Collection Time: 05/13/20 10:11 AM  Result Value Ref Range   Color, Urine YELLOW YELLOW   APPearance HAZY (A) CLEAR   Specific Gravity, Urine 1.008 1.005 - 1.030   pH 6.0 5.0 - 8.0   Glucose, UA NEGATIVE NEGATIVE mg/dL   Hgb urine dipstick NEGATIVE NEGATIVE   Bilirubin Urine NEGATIVE NEGATIVE   Ketones, ur  NEGATIVE NEGATIVE mg/dL   Protein, ur NEGATIVE NEGATIVE mg/dL   Nitrite POSITIVE (A) NEGATIVE   Leukocytes,Ua MODERATE (A) NEGATIVE   RBC / HPF 0-5 0 - 5 RBC/hpf   WBC, UA 6-10 0 - 5 WBC/hpf   Bacteria, UA MANY (A) NONE SEEN   Squamous Epithelial / LPF 0-5 0 - 5    Comment: Performed at [facility], [place] [address], [place], [place] [address]  Resp Panel by RT-PCR (Flu A&B, Covid) Nasopharyngeal Swab     Status: None   Collection Time: 05/13/20 11:31 AM   Specimen: Nasopharyngeal Swab; Nasopharyngeal(NP) swabs in vial transport medium  Result Value Ref Range   SARS Coronavirus 2 by RT PCR NEGATIVE NEGATIVE    Comment: (NOTE) SARS-CoV-2 target nucleic acids are NOT DETECTED.  The SARS-CoV-2 RNA is generally detectable in upper respiratory specimens during the acute phase of infection. The lowest concentration of SARS-CoV-2 viral copies this assay can detect is 138 copies/mL. A negative result does not preclude SARS-Cov-2 infection and should not be used as the sole basis for treatment or other patient management decisions. A negative result may occur with  improper specimen collection/handling, submission of specimen other than nasopharyngeal swab, presence of viral mutation(s) within the areas targeted by this assay, and inadequate number of viral copies(<138 copies/mL). A negative result must be combined with clinical observations, patient history, and epidemiological information. The  expected result is Negative.  Fact Sheet for Patients:  EntrepreneurPulse.com.au  Fact Sheet for Healthcare Providers:  IncredibleEmployment.be  This test is no t yet approved or cleared by the [place] FDA and  has been authorized for detection and/or diagnosis of SARS-CoV-2 by FDA under an Emergency Use Authorization (EUA). This EUA will remain  in effect (meaning this test can be used) for the duration of the COVID-19 declaration  under Section 564(b)(1) of the Act, 21 U.S.C.section 360bbb-3(b)(1), unless the authorization is terminated  or revoked sooner.       Influenza A by PCR NEGATIVE NEGATIVE   Influenza B by PCR NEGATIVE NEGATIVE    Comment: (NOTE) The Xpert Xpress SARS-CoV-2/FLU/RSV plus assay is intended as an aid in the diagnosis of influenza from Nasopharyngeal swab specimens and should not be used as a sole basis for treatment. Nasal washings and aspirates are unacceptable for Xpert Xpress SARS-CoV-2/FLU/RSV testing.  Fact Sheet for Patients: EntrepreneurPulse.com.au  Fact Sheet for Healthcare Providers: IncredibleEmployment.be  This test is not yet approved or cleared by the [place] FDA and has been authorized for detection and/or diagnosis of SARS-CoV-2 by FDA under an Emergency Use Authorization (EUA). This EUA will remain in effect (meaning this test can be used) for the duration of the COVID-19 declaration under Section 564(b)(1) of the Act, 21 U.S.C. section 360bbb-3(b)(1), unless the authorization is terminated or revoked.  Performed at [facility], [place] [address], [place], [place] [address]    DG Chest 2 View  Result Date: 05/13/2020 CLINICAL DATA:  Rales. EXAM: CHEST - 2 VIEW COMPARISON:  04/20/2019 FINDINGS: Left base atelectasis or infiltrate noted with probable small left pleural effusion. Skin fold overlies the lower left hemithorax. Right lung clear. Cardiopericardial silhouette is at upper limits of normal for size. Degenerative changes noted in both shoulders. Telemetry leads overlie the chest. IMPRESSION: Left base atelectasis or infiltrate with probable small left pleural effusion. Electronically Signed   By: Misty Stanley M.D.   On: 05/13/2020 08:06   DG Ribs Unilateral Left  Result Date: 05/13/2020 CLINICAL DATA:  Unwitnessed fall left-sided pain EXAM: LEFT RIBS - 2 VIEW COMPARISON:  Chest x-ray from earlier  today FINDINGS: Left lateral seventh, eighth, ninth, tenth, and eleventh rib fractures. The ninth rib fracture is displaced. Atelectasis without pneumothorax or large pleural effusion. Thoracolumbar spine degeneration with scoliosis. IMPRESSION: Left seventh through eleventh rib fractures with up to mild displacement. Electronically Signed   By: Monte Fantasia M.D.   On: 05/13/2020 10:18   CT Head Wo Contrast  Result Date: 05/13/2020 CLINICAL DATA:  Unwitnessed fall EXAM: CT HEAD WITHOUT CONTRAST TECHNIQUE: Contiguous axial images were obtained from the base of the skull through the vertex without intravenous contrast. COMPARISON:  08/08/2016 FINDINGS: Brain: There is atrophy and chronic small vessel disease changes. No acute intracranial abnormality. Specifically, no hemorrhage, hydrocephalus, mass lesion, acute infarction, or significant intracranial injury. Vascular: No hyperdense vessel or unexpected calcification. Skull: No acute calvarial abnormality. Sinuses/Orbits: No acute findings Other: None IMPRESSION: Atrophy, chronic microvascular disease. No acute intracranial abnormality. Electronically Signed   By: Rolm Baptise M.D.   On: 05/13/2020 08:43   CT Chest W Contrast  Result Date: 05/13/2020 CLINICAL DATA:  Pain following fall EXAM: CT CHEST WITH CONTRAST TECHNIQUE: Multidetector CT imaging of the chest was performed during intravenous contrast administration. CONTRAST:  60mL OMNIPAQUE IOHEXOL 300 MG/ML  SOLN COMPARISON:  Chest CT April 20, 2019; rib radiographs May 13, 2020 FINDINGS: Cardiovascular: No  thoracic aortic aneurysm or dissection is evident. Foci of calcification noted in visualized great vessels. There is aortic atherosclerosis. There is apparent intramural hematoma in the posterior aortic extending from the arch to the midportion causing approximately 50% diameter narrowing. No ulceration in this area. No mediastinal hematoma evident. No plaque ulceration is appreciable by CT.  There are foci of coronary artery calcification. There is no pericardial thickening. A fairly small amount of pericardial fluid is noted superiorly which is somewhat similar to prior study and may be within physiologic range. No major vessel pulmonary embolus. Mediastinum/Nodes: Thyroid appears unremarkable. No appreciable thoracic adenopathy. There is a paraesophageal hernia extending to the left, also present previously. Lungs/Pleura: There is atelectatic change in each lung base, more severe on the left than on the right with small amount of pleural fluid on the left. Mild consolidation also noted in the left base. No appreciable pneumothorax on the left. No pulmonary edema appreciable. Trachea and major bronchial structures appear patent. Upper Abdomen: There is hepatic steatosis. There is upper abdominal aortic atherosclerosis. Visualized liver and spleen appear intact. Musculoskeletal: There are fractures of the lateral left fifth, lateral seventh, lateral eighth, lateral ninth, and lateral tenth ribs on the left with displacement of lateral left ninth and tenth ribs. Questionable nondisplaced fracture lateral left sixth rib. No blastic or lytic bone lesions are evident. There is degenerative change in the thoracic spine. No appreciable chest wall lesions. Previously noted mass in the right breast no longer evident. IMPRESSION: 1. Multiple rib fractures on the left. Small left pleural effusion but no pneumothorax. 2. Bibasilar atelectasis, more severe on the left than the right with questionable developing infiltrate left base. 3. Apparent intramural hematoma in the aorta posteriorly involving the arch and proximal to mid descending aorta. No aneurysm or dissection. No mediastinal hematoma. There is aortic atherosclerosis. 4.  No adenopathy. 5.  Paraesophageal hernia again noted. 6.  Hepatic steatosis. 7. Fairly small amount of pericardial fluid superiorly, stable compared to the February 2021 study. Aortic  Atherosclerosis (ICD10-I70.0). Electronically Signed   By: Lowella Grip III M.D.   On: 05/13/2020 12:09   CT Cervical Spine Wo Contrast  Result Date: 05/13/2020 CLINICAL DATA:  Unwitnessed fall. EXAM: CT CERVICAL SPINE WITHOUT CONTRAST TECHNIQUE: Multidetector CT imaging of the cervical spine was performed without intravenous contrast. Multiplanar CT image reconstructions were also generated. COMPARISON:  08/08/2016 FINDINGS: Alignment: Slight anterolisthesis of C4 on C5 related to facet disease. No subluxation. Skull base and vertebrae: No acute fracture. No primary bone lesion or focal pathologic process. Soft tissues and spinal canal: No prevertebral fluid or swelling. No visible canal hematoma. Disc levels: Diffuse advanced degenerative disc disease and facet disease. Upper chest: No acute findings Other: None IMPRESSION: Advanced cervical spondylosis. No acute bony abnormality. Electronically Signed   By: Rolm Baptise M.D.   On: 05/13/2020 08:45   DG Hip Unilat W or Wo Pelvis 2-3 Views Left  Result Date: 05/13/2020 CLINICAL DATA:  Unwitnessed fall.  Pain. EXAM: DG HIP (WITH OR WITHOUT PELVIS) 2-3V LEFT COMPARISON:  None. FINDINGS: There is no evidence of hip fracture or dislocation. There is no evidence of arthropathy or other focal bone abnormality. IMPRESSION: Negative. Electronically Signed   By: Misty Stanley M.D.   On: 05/13/2020 08:04      Assessment/Plan Fall, unwitnessed Left 7-11 rib fxs - pain control, IS, pulm toilet, PT/OT eval for mobilization. Repeat CXR in am.  Otherwise these should heal on their own in 6-8 weeks.  k-pad/ice prn Aortic arch and proximal descending aortic intramural hematoma - this was not present on last CTA about a year ago.  It is felt to likely be secondary to her traumatic even, but this is not a definite.  I have consulted Dr. Donzetta Matters with [facility].  He will see her and recommends BP control (currently good) and likely repeat scan in next 48 hrs or so to make  sure this is stable. UTI - Rocephin, per medicine Anxiety/Depression - per medicine Dementia -  Per medicine Breast cancer - s/p lumpectomy on oral chemotherapy DNR FEN - HH diet  VTE - SCDs, hold in setting of aortic hematoma to assure stability ID - Rocephin for UTI Dispo - tx to [facility] for [facility] to follow.  Trauma service will follow at [facility] as well   Henreitta Cea, [facility] 05/13/2020, 1:30 PM Please see Amion for pager number during day hours 7:00am-4:30pm or 7:00am -11:30am on weekends

## 2020-05-14 ENCOUNTER — Inpatient Hospital Stay (HOSPITAL_COMMUNITY): Payer: Medicare Other

## 2020-05-14 DIAGNOSIS — Z170 Estrogen receptor positive status [ER+]: Secondary | ICD-10-CM

## 2020-05-14 DIAGNOSIS — C50411 Malignant neoplasm of upper-outer quadrant of right female breast: Secondary | ICD-10-CM

## 2020-05-14 DIAGNOSIS — I71019 Dissection of thoracic aorta, unspecified: Secondary | ICD-10-CM

## 2020-05-14 DIAGNOSIS — W19XXXD Unspecified fall, subsequent encounter: Secondary | ICD-10-CM

## 2020-05-14 DIAGNOSIS — M818 Other osteoporosis without current pathological fracture: Secondary | ICD-10-CM

## 2020-05-14 DIAGNOSIS — F3342 Major depressive disorder, recurrent, in full remission: Secondary | ICD-10-CM

## 2020-05-14 DIAGNOSIS — L8990 Pressure ulcer of unspecified site, unspecified stage: Secondary | ICD-10-CM | POA: Insufficient documentation

## 2020-05-14 DIAGNOSIS — S2239XA Fracture of one rib, unspecified side, initial encounter for closed fracture: Secondary | ICD-10-CM

## 2020-05-14 DIAGNOSIS — I7101 Dissection of thoracic aorta: Secondary | ICD-10-CM

## 2020-05-14 DIAGNOSIS — R55 Syncope and collapse: Secondary | ICD-10-CM

## 2020-05-14 LAB — COMPREHENSIVE METABOLIC PANEL
ALT: 30 U/L (ref 0–44)
AST: 27 U/L (ref 15–41)
Albumin: 2.2 g/dL — ABNORMAL LOW (ref 3.5–5.0)
Alkaline Phosphatase: 64 U/L (ref 38–126)
Anion gap: 7 (ref 5–15)
BUN: 8 mg/dL (ref 8–23)
CO2: 29 mmol/L (ref 22–32)
Calcium: 7.7 mg/dL — ABNORMAL LOW (ref 8.9–10.3)
Chloride: 102 mmol/L (ref 98–111)
Creatinine, Ser: 0.58 mg/dL (ref 0.44–1.00)
GFR, Estimated: >60 mL/min (ref >60)
Glucose, Bld: 114 mg/dL — ABNORMAL HIGH (ref 70–99)
Potassium: 2.7 mmol/L — CL (ref 3.5–5.1)
Sodium: 138 mmol/L (ref 135–145)
Total Bilirubin: 1.5 mg/dL — ABNORMAL HIGH (ref 0.3–1.2)
Total Protein: 4.6 g/dL — ABNORMAL LOW (ref 6.5–8.1)

## 2020-05-14 LAB — CBC
HCT: 39.4 % (ref 36.0–46.0)
Hemoglobin: 13.4 g/dL (ref 12.0–15.0)
MCH: 32.9 pg (ref 26.0–34.0)
MCHC: 34 g/dL (ref 30.0–36.0)
MCV: 96.8 fL (ref 80.0–100.0)
Platelets: 208 10*3/uL (ref 150–400)
RBC: 4.07 MIL/uL (ref 3.87–5.11)
RDW: 13.2 % (ref 11.5–15.5)
WBC: 7.9 10*3/uL (ref 4.0–10.5)
nRBC: 0 % (ref 0.0–0.2)

## 2020-05-14 LAB — CK: Total CK: 55 U/L (ref 38–234)

## 2020-05-14 LAB — BASIC METABOLIC PANEL
Anion gap: 7 (ref 5–15)
BUN: 10 mg/dL (ref 8–23)
CO2: 24 mmol/L (ref 22–32)
Calcium: 7.9 mg/dL — ABNORMAL LOW (ref 8.9–10.3)
Chloride: 104 mmol/L (ref 98–111)
Creatinine, Ser: 0.75 mg/dL (ref 0.44–1.00)
GFR, Estimated: >60 mL/min (ref >60)
Glucose, Bld: 203 mg/dL — ABNORMAL HIGH (ref 70–99)
Potassium: 4.8 mmol/L (ref 3.5–5.1)
Sodium: 135 mmol/L (ref 135–145)

## 2020-05-14 LAB — MRSA PCR SCREENING: MRSA by PCR: NEGATIVE

## 2020-05-14 LAB — ECHOCARDIOGRAM COMPLETE
Calc EF: 31.6 %
Height: 65 in
P 1/2 time: 251 msec
S' Lateral: 4 cm
Single Plane A2C EF: 37.9 %
Single Plane A4C EF: 22.2 %
Weight: 1781.32 oz

## 2020-05-14 LAB — VITAMIN D 25 HYDROXY (VIT D DEFICIENCY, FRACTURES): Vit D, 25-Hydroxy: 44.31 ng/mL (ref 30–100)

## 2020-05-14 LAB — TSH: TSH: 1.306 u[IU]/mL (ref 0.350–4.500)

## 2020-05-14 LAB — MAGNESIUM: Magnesium: 1.9 mg/dL (ref 1.7–2.4)

## 2020-05-14 MED ORDER — LIDOCAINE 5 % EX PTCH
1.0000 | MEDICATED_PATCH | CUTANEOUS | Status: DC
  Administered 2020-05-14 – 2020-05-18 (×5): 1 via TRANSDERMAL
  Filled 2020-05-14 (×7): qty 1

## 2020-05-14 MED ORDER — POTASSIUM CHLORIDE 20 MEQ PO PACK
40.0000 meq | PACK | Freq: Two times a day (BID) | ORAL | Status: DC
  Administered 2020-05-14: 40 meq via ORAL
  Filled 2020-05-14 (×2): qty 2

## 2020-05-14 MED ORDER — POTASSIUM CHLORIDE 10 MEQ/100ML IV SOLN
10.0000 meq | INTRAVENOUS | Status: DC
  Administered 2020-05-14 (×2): 10 meq via INTRAVENOUS
  Filled 2020-05-14 (×2): qty 100

## 2020-05-14 MED ORDER — CHOLESTYRAMINE 4 G PO PACK
1.0000 | PACK | Freq: Two times a day (BID) | ORAL | Status: DC
  Administered 2020-05-14 – 2020-05-19 (×11): 1 via ORAL
  Filled 2020-05-14 (×12): qty 1

## 2020-05-14 MED ORDER — DM-GUAIFENESIN ER 30-600 MG PO TB12: 1.0000 | ORAL_TABLET | Freq: Two times a day (BID) | ORAL | Status: DC | PRN

## 2020-05-14 MED ORDER — ANASTROZOLE 1 MG PO TABS
1.0000 mg | ORAL_TABLET | Freq: Every day | ORAL | Status: DC
  Administered 2020-05-14 – 2020-05-19 (×6): 1 mg via ORAL
  Filled 2020-05-14 (×6): qty 1

## 2020-05-14 MED ORDER — SERTRALINE HCL 25 MG PO TABS
50.0000 mg | ORAL_TABLET | Freq: Every day | ORAL | Status: DC
  Administered 2020-05-14 – 2020-05-19 (×6): 50 mg via ORAL
  Filled 2020-05-14 (×7): qty 2

## 2020-05-14 MED ORDER — PREDNISONE 2.5 MG PO TABS
2.5000 mg | ORAL_TABLET | Freq: Every day | ORAL | Status: DC
  Administered 2020-05-14 – 2020-05-19 (×6): 2.5 mg via ORAL
  Filled 2020-05-14 (×6): qty 1

## 2020-05-14 MED ORDER — POTASSIUM CHLORIDE 20 MEQ PO PACK
40.0000 meq | PACK | Freq: Once | ORAL | Status: AC
  Administered 2020-05-14: 40 meq via ORAL
  Filled 2020-05-14: qty 2

## 2020-05-14 MED ORDER — IPRATROPIUM-ALBUTEROL 0.5-2.5 (3) MG/3ML IN SOLN: 3.0000 mL | RESPIRATORY_TRACT | Status: DC | PRN

## 2020-05-14 NOTE — NC FL2 (Signed)
[place] LEVEL OF CARE SCREENING TOOL     IDENTIFICATION  Patient Name: Becky [name] Birthdate: 1929/12/13 Sex: female Admission Date (Current Location): 05/13/2020  [facility] and [place] Number:  Herbalist and Address:  [facility], [place] [address], [place], [place] [address]      Provider Number: [id number]  Attending Physician Name and Address:  Damita Lack, MD  Relative Name and Phone Number:  Merrily Pew (son) [phone]    Current Level of Care: Hospital Recommended Level of Care: [place] Prior Approval Number:    Date Approved/Denied:   PASRR Number:    Discharge Plan: SNF    Current Diagnoses: Patient Active Problem List   Diagnosis Date Noted  . Rib fracture 05/14/2020  . Intramural hematoma of thoracic aorta ([place]) 05/14/2020  . Fall 05/13/2020  . Malignant neoplasm of upper-outer quadrant of right breast in female, estrogen receptor positive ([place]) 06/05/2019  . Osteoporosis 05/03/2015  . MDD (major depressive disorder) 05/03/2015  . Glaucoma 05/03/2015  . Fuchs' corneal dystrophy 05/03/2015  . Dementia arising in the senium and presenium ([place]) 05/25/2014  . 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    Orientation RESPIRATION BLADDER Height & Weight     Self,Place  Normal Continent Weight: 111 lb 5.3 oz (50.5 kg) Height:  5\' 5"  (165.1 cm)  BEHAVIORAL SYMPTOMS/MOOD NEUROLOGICAL BOWEL NUTRITION STATUS      Continent Diet (See DC summary)  AMBULATORY STATUS COMMUNICATION OF NEEDS Skin   Extensive Assist Verbally Other (Comment) (Stage1 pressure injury left buttocks, skin tear left elbow, skin laceration on left hand)                       Personal Care Assistance Level of Assistance  Bathing,Feeding,Dressing Bathing Assistance: Maximum assistance Feeding assistance: Limited assistance Dressing Assistance: Maximum assistance     Functional Limitations Info  Sight,Hearing,Speech  Sight Info: Adequate Hearing Info: Adequate Speech Info: Adequate    SPECIAL CARE FACTORS FREQUENCY  PT (By licensed PT),OT (By licensed OT)     PT Frequency: 5X per week OT Frequency: 5X per week            Contractures      Additional Factors Info  Code Status,Allergies,Psychotropic Code Status Info: DNR Allergies Info: Brimonidine Psychotropic Info: Zoloft         Current Medications (05/14/2020):  This is the current hospital active medication list Current Facility-Administered Medications  Medication Dose Route Frequency Provider Last Rate Last Admin  . acetaminophen (TYLENOL) tablet 650 mg  650 mg Oral Q6H PRN Marylyn Ishihara, Tyrone A, DO       Or  . acetaminophen (TYLENOL) suppository 650 mg  650 mg Rectal Q6H PRN Marylyn Ishihara, Tyrone A, DO      . acetaminophen (TYLENOL) tablet 650 mg  650 mg Oral Q6H Saverio Danker, PA-C   650 mg at 05/14/20 0844  . anastrozole (ARIMIDEX) tablet 1 mg  1 mg Oral Daily Amin, Ankit Chirag, MD   1 mg at 05/14/20 1037  . cefTRIAXone (ROCEPHIN) 1 g in sodium chloride 0.9 % 100 mL IVPB  1 g Intravenous Q24H Kyle, Tyrone A, DO 200 mL/hr at 05/14/20 1107 1 g at 05/14/20 1107  . cholestyramine (QUESTRAN) packet 1 packet  1 packet Oral BID Damita Lack, MD   1 packet at 05/14/20 1037  . dextromethorphan-guaiFENesin (MUCINEX DM) 30-600 MG per 12 hr tablet 1 tablet  1 tablet Oral BID PRN Amin,  Ankit Chirag, MD      . docusate sodium (COLACE) capsule 100 mg  100 mg Oral BID Marylyn Ishihara, Tyrone A, DO   100 mg at 05/14/20 1036  . HYDROcodone-acetaminophen (NORCO/VICODIN) 5-325 MG per tablet 1-2 tablet  1-2 tablet Oral Q4H PRN Marylyn Ishihara, Tyrone A, DO   1 tablet at 05/14/20 1036  . ipratropium-albuterol (DUONEB) 0.5-2.5 (3) MG/3ML nebulizer solution 3 mL  3 mL Nebulization Q4H PRN Amin, Ankit Chirag, MD      . lidocaine (LIDODERM) 5 % 1 patch  1 patch Transdermal Q24H Amin, Ankit Chirag, MD      . morphine 2 MG/ML injection 2 mg  2 mg Intravenous Q4H PRN Marylyn Ishihara, Tyrone A, DO       . ondansetron (ZOFRAN) tablet 4 mg  4 mg Oral Q6H PRN Marylyn Ishihara, Tyrone A, DO       Or  . ondansetron (ZOFRAN) injection 4 mg  4 mg Intravenous Q6H PRN Marylyn Ishihara, Tyrone A, DO      . potassium chloride (KLOR-CON) packet 40 mEq  40 mEq Oral BID Zierle-Ghosh, Asia B, DO   40 mEq at 05/14/20 1037  . potassium chloride (KLOR-CON) packet 40 mEq  40 mEq Oral Once Amin, Ankit Chirag, MD      . predniSONE (DELTASONE) tablet 2.5 mg  2.5 mg Oral Daily Amin, Ankit Chirag, MD   2.5 mg at 05/14/20 1037  . sertraline (ZOLOFT) tablet 50 mg  50 mg Oral Daily Amin, Ankit Chirag, MD   50 mg at 05/14/20 1036  . traMADol (ULTRAM) tablet 50 mg  50 mg Oral Q6H PRN Saverio Danker, PA-C         Discharge Medications: Please see discharge summary for a list of discharge medications.  Relevant Imaging Results:  Relevant Lab Results:   Additional Information SSN: [id number]  Glennon Hamilton, Student-Social Work

## 2020-05-14 NOTE — Progress Notes (Signed)
  Echocardiogram 2D Echocardiogram has been performed.  Becky [name] 05/14/2020, 4:02 PM

## 2020-05-14 NOTE — Evaluation (Addendum)
Physical Therapy Evaluation Patient Details Name: Becky [name] MRN: [id number] DOB: Mar 11, 1929 Today's Date: 05/14/2020   History of Present Illness  Becky [name] is a 87 y.o. female admitted 3/17 from [facility] with fall. Pt with left 7-11 rib fx. Pt found to have aortic interval hematoma and hypokalemia. PMhx: dementia, anxiety/depression, fibromyalgia, glaucoma, breast CA, polio, scoliosis, COPD  Clinical Impression  Pt supine on arrival providing inaccurate hx that she works at [employer] but daughter present to clarify assist with ADLs and that pt walks down hall at [employer] and receives PT. Pt with decreased mobility, strength and function limited by back pain this session who will benefit from acute therapy to maximize mobility, gait and safety to decrease fall risk and burden of care. Encouraged OOB daily with nursing staff particularly toileting and to chair for meals.   HR 92 SpO2 93% on RA   Follow Up Recommendations SNF;Supervision/Assistance - 24 hour    Equipment Recommendations  None recommended by PT    Recommendations for Other Services       Precautions / Restrictions Precautions Precautions: Fall Precaution Comments: left rib fx      Mobility  Bed Mobility Overal bed mobility: Needs Assistance Bed Mobility: Supine to Sit    Supine to sit: Min assist     General bed mobility comments: min assist to pivot to right side of bed and elevate trunk    Transfers Overall transfer level: Needs assistance   Transfers: Sit to/from Stand;Stand Pivot Transfers Sit to Stand: Min assist Stand pivot transfers: Min guard       General transfer comment: min assist to rise from bed and bSC with cues for hand placement, pivot bed<>BSC with RW  Ambulation/Gait Ambulation/Gait assistance: Min assist Gait Distance (Feet): 10 Feet Assistive device: Rolling walker (2 wheeled) Gait Pattern/deviations: Step-through pattern;Decreased stride length;Trunk flexed   Gait  velocity interpretation: <1.8 ft/sec, indicate of risk for recurrent falls General Gait Details: assist to direct RW with guarding for stability  Stairs            Wheelchair Mobility    Modified Rankin (Stroke Patients Only)       Balance Overall balance assessment: History of Falls;Needs assistance   Sitting balance-Leahy Scale: Fair Sitting balance - Comments: EOB and BSC without UE support   Standing balance support: Bilateral upper extremity supported Standing balance-Leahy Scale: Poor Standing balance comment: RW for standing pericare and gait                             Pertinent Vitals/Pain Pain Assessment: Faces Pain Score: 4  Faces Pain Scale: Hurts whole lot Pain Location: pt reporting back/rib pain and no pain in LUE Pain Descriptors / Indicators: Grimacing Pain Intervention(s): Limited activity within patient's tolerance;Repositioned;Premedicated before session    Home Living Family/patient expects to be discharged to:: Skilled nursing facility                      Prior Function Level of Independence: Needs assistance   Gait / Transfers Assistance Needed: uses a rW, has PT and walks long hall way  ADL's / Homemaking Assistance Needed: assist for getting up and bathing per daughter gets a "whirlpool bath" weekly with assist  Comments: pt stating she works at [employer] and daughter providing PLOF     Hand Dominance   Dominant Hand: Right    Extremity/Trunk Assessment   Upper Extremity Assessment  Upper Extremity Assessment: Defer to OT evaluation     Lower Extremity Assessment Lower Extremity Assessment: Generalized weakness    Cervical / Trunk Assessment Cervical / Trunk Assessment: Kyphotic  Communication   Communication: No difficulties  Cognition Arousal/Alertness: Awake/alert Behavior During Therapy: WFL for tasks assessed/performed Overall Cognitive Status: History of cognitive impairments - at baseline                                       General Comments      Exercises General Exercises - Lower Extremity Long Arc Quad: AROM;Both;10 reps;Seated Hip Flexion/Marching: AROM;Both;10 reps;Seated   Assessment/Plan    PT Assessment Patient needs continued PT services  PT Problem List Decreased strength;Decreased mobility;Decreased safety awareness;Decreased balance;Decreased knowledge of use of DME;Decreased cognition;Decreased activity tolerance       PT Treatment Interventions DME instruction;Therapeutic exercise;Functional mobility training;Therapeutic activities;Patient/family education;Balance training;Gait training    PT Goals (Current goals can be found in the Care Plan section)  Acute Rehab PT Goals Patient Stated Goal: return home ([facility]) PT Goal Formulation: With patient/family Time For Goal Achievement: 05/28/20 Potential to Achieve Goals: Fair    Frequency Min 2X/week   Barriers to discharge        Co-evaluation               AM-PAC PT "6 Clicks" Mobility  Outcome Measure Help needed turning from your back to your side while in a flat bed without using bedrails?: A Little Help needed moving from lying on your back to sitting on the side of a flat bed without using bedrails?: A Little Help needed moving to and from a bed to a chair (including a wheelchair)?: A Little Help needed standing up from a chair using your arms (e.g., wheelchair or bedside chair)?: A Little Help needed to walk in hospital room?: A Little Help needed climbing 3-5 steps with a railing? : Total 6 Click Score: 16    End of Session   Activity Tolerance: Patient tolerated treatment well Patient left: in chair;with call bell/phone within reach;with chair alarm set;with family/visitor present Nurse Communication: Mobility status PT Visit Diagnosis: Other abnormalities of gait and mobility (R26.89);Difficulty in walking, not elsewhere classified (R26.2)    Time: 8527-7824 PT Time  Calculation (min) (ACUTE ONLY): 25 min   Charges:   PT Evaluation $PT Eval Moderate Complexity: 1 Mod PT Treatments $Therapeutic Activity: 8-22 mins        Becky P, PT Acute Rehabilitation Services Pager: [phone] Office: [phone]   Becky [name] 05/14/2020, 11:48 AM

## 2020-05-14 NOTE — Progress Notes (Signed)
Potassium of 2.7 called by lab. Hospitalist notified.

## 2020-05-14 NOTE — Plan of Care (Signed)
  Problem: Clinical Measurements: Goal: Ability to maintain clinical measurements within normal limits will improve Outcome: Progressing Goal: Will remain free from infection Outcome: Progressing Goal: Respiratory complications will improve Outcome: Progressing Goal: Cardiovascular complication will be avoided Outcome: Progressing   Problem: Nutrition: Goal: Adequate nutrition will be maintained Outcome: Progressing   Problem: Coping: Goal: Level of anxiety will decrease Outcome: Progressing

## 2020-05-14 NOTE — Plan of Care (Signed)

## 2020-05-14 NOTE — Evaluation (Signed)
Occupational Therapy Evaluation Patient Details Name: Becky [name] MRN: [id number] DOB: 08-27-1929 Today's Date: 05/14/2020    History of Present Illness Becky [name] is a 87 y.o. female admitted 3/17 from [facility] with fall. Pt with left 7-11 rib fx. Pt found to have aortic interval hematoma and hypokalemia. PMhx: dementia, anxiety/depression, fibromyalgia, glaucoma, breast CA, polio, scoliosis, COPD   Clinical Impression   This 87 yo female admitted with above presents to acute OT with PLOF per her report of being able to ambulate with and without an AD on her own and bath/dress herself on her own. Currently she is setup-total A for basic ADLs. She was consisently c/o pain in left UE for the entire session once BP automatically took at beginning of session. She will continue to benefit from acute OT with follow up at SNF.    Follow Up Recommendations  SNF;Supervision/Assistance - 24 hour    Equipment Recommendations  None recommended by OT       Precautions / Restrictions Precautions Precautions: Fall Precaution Comments: left rib fractures and awaiting potential xray of LUE Restrictions Weight Bearing Restrictions: Yes Other Position/Activity Restrictions: Keeping LUE NWB'ing for now pending potential xray      Mobility Bed Mobility Overal bed mobility: Needs Assistance Bed Mobility: Rolling Rolling: Mod assist         General bed mobility comments: limited rolling ot left due to ribs and LUE (c/o pain)    Transfers                 General transfer comment: Did not attempt OOB due to continued increased c/o pain in LUE        ADL either performed or assessed with clinical judgement   ADL Overall ADL's : Needs assistance/impaired Eating/Feeding: Set up;Bed level   Grooming: Moderate assistance;Bed level   Upper Body Bathing: Maximal assistance;Bed level   Lower Body Bathing: Total assistance;Bed level   Upper Body Dressing : Total assistance;Bed  level   Lower Body Dressing: Total assistance;Bed level                       Vision Patient Visual Report: No change from baseline              Pertinent Vitals/Pain Pain Assessment: Faces Faces Pain Scale: Hurts whole lot Pain Location: LUE (upper arm more than lower arm); constant reporting her LUE hurts Pain Descriptors / Indicators: Guarding;Grimacing Pain Intervention(s): Limited activity within patient's tolerance;Monitored during session;Repositioned (RN made aware, Dr. Reesa Chew sent chat text)     Hand Dominance Right   Extremity/Trunk Assessment Upper Extremity Assessment Upper Extremity Assessment: LUE deficits/detail LUE Deficits / Details: can open and close hand without pain, AROM finger to nose c/o  pain in upper arm, AAROM shoulder flexion to~30 degrees c/o pain and then back down by side she grimaced with pain in shoulder LUE Coordination: decreased gross motor           Communication Communication Communication: No difficulties   Cognition Arousal/Alertness: Awake/alert Behavior During Therapy: WFL for tasks assessed/performed Overall Cognitive Status: History of cognitive impairments - at baseline                                 General Comments: Dtr reports pt is not a complainer so that she consistently reports her LUE is hurting it must really be really bothering her  Home Living Family/patient expects to be discharged to:: Skilled nursing facility                                        Prior Functioning/Environment Level of Independence: Needs assistance  Gait / Transfers Assistance Needed: Per pt she sometimes uses a RW ADL's / Homemaking Assistance Needed: Per pt she says she baths and dresses herself   Comments: ? validty of information with pt's h/o dementia, resides at [facility]        OT Problem List: Decreased strength;Decreased range of motion;Decreased activity  tolerance;Impaired balance (sitting and/or standing);Impaired UE functional use;Pain;Decreased cognition      OT Treatment/Interventions: Self-care/ADL training;DME and/or AE instruction;Patient/family education;Balance training    OT Goals(Current goals can be found in the care plan section) Acute Rehab OT Goals Patient Stated Goal: to not have LUE hurt OT Goal Formulation: With patient Time For Goal Achievement: 05/28/20 Potential to Achieve Goals: Good  OT Frequency: Min 2X/week              AM-PAC OT "6 Clicks" Daily Activity     Outcome Measure Help from another person eating meals?: A Little Help from another person taking care of personal grooming?: A Lot Help from another person toileting, which includes using toliet, bedpan, or urinal?: Total Help from another person bathing (including washing, rinsing, drying)?: A Lot Help from another person to put on and taking off regular upper body clothing?: Total Help from another person to put on and taking off regular lower body clothing?: Total 6 Click Score: 10   End of Session Nurse Communication:  (LUE pain)  Activity Tolerance: Patient limited by pain Patient left: in bed;with call bell/phone within reach;with bed alarm set;with family/visitor present (dtr)  OT Visit Diagnosis: Unsteadiness on feet (R26.81);Other abnormalities of gait and mobility (R26.89);Muscle weakness (generalized) (M62.81);Pain;Other symptoms and signs involving cognitive function Pain - Right/Left: Left Pain - part of body: Arm                Time: 4037-5436 OT Time Calculation (min): 60 min Charges:  OT General Charges $OT Visit: 1 Visit OT Evaluation $OT Eval Moderate Complexity: 1 Mod OT Treatments $Self Care/Home Management : 38-52 mins  Golden Circle, OTR/L Acute [employer] Pager [phone] Office [phone]    Almon Register 05/14/2020, 9:52 AM

## 2020-05-14 NOTE — Progress Notes (Signed)
PROGRESS NOTE    Becky [name]  URK:[id number] DOB: 01-Jun-1929 DOA: 05/13/2020 PCP: System, Provider Not In   Brief Narrative:  87 year old with history of significant dementia, anxiety, depression admitted from SNF due to unwitnessed fall.  Unable to provide any history but there is concerned that she had hit her head and falling on her left side.  Trauma work-up showed multiple rib fractures, hypokalemia and intramural aortic thrombus.  Vascular and trauma team were consulted.  CT of the head and neck were negative.  She was also found to have UTI started on Rocephin.   Assessment & Plan:   Active Problems:   Osteoporosis   MDD (major depressive disorder)   Malignant neoplasm of upper-outer quadrant of right breast in female, estrogen receptor positive ([place])   Fall   Rib fracture   Intramural hematoma of thoracic aorta ([place])  Unwitnessed fall Left-sided rib fractures 7-11, closed -Plan will be for pain control, bowel regimen. -Seen by trauma service.  CT head and neck is negative.  No surgical intervention at this time -Supportive care.  Eventually will get PT/OT  Aortic arch proximal descending aortic intramural hematoma -Seen by vascular surgery, recommending blood pressure and pain control at this time.  Repeat scan in next 24-48 hours per vascular surgery  Hypokalemia -Replete aggressively. Not tolerating IV therefore getting PO.   Urinary tract infection -Empiric IV Rocephin. Ucx = GNR  Anxiety/depression Dementia -Supportive care.  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  On anastrozole.  Follows with outpatient Dr. Jana Hakim      DVT prophylaxis: SCDs Start: 05/13/20 2319 Code Status: DNR Family Communication:  Daugther at bedside   Status is: Inpatient  Remains inpatient appropriate because:Inpatient level of care appropriate due to severity of illness   Dispo: The patient is from: Home               Anticipated d/c is to: TBD              Patient currently is not medically stable to d/c.  Maintain hospital stay due to fall, significant electrolyte abnormality and refracture.  She also has aortic intramural hematoma requiring vascular evaluation.  Unsafe for discharge   Difficult to place patient No       Body mass index is 18.53 kg/m.  Pressure Injury 05/13/20 Buttocks Left Stage 1 -  Intact skin with non-blanchable redness of a localized area usually over a bony prominence. Intact red and non blanchable area from prior Purewick malposition (Active)  05/13/20 2310  Location: Buttocks  Location Orientation: Left  Staging: Stage 1 -  Intact skin with non-blanchable redness of a localized area usually over a bony prominence.  Wound Description (Comments): Intact red and non blanchable area from prior Purewick malposition  Present on Admission: Yes    Subjective: Patient is reporting of left arm burning with IV potassium infusion despite of slowing it down. Also has some rib pain fairly okay controlled for now.  Denies any other complaints.  Daughter is present at bedside.  Review of Systems Otherwise negative except as per HPI, including: General: Denies fever, chills, night sweats or unintended weight loss. Resp: Denies cough, wheezing, shortness of breath. Cardiac: Denies chest pain, palpitations, orthopnea, paroxysmal nocturnal dyspnea. GI: Denies abdominal pain, nausea, vomiting, diarrhea or constipation GU: Denies dysuria, frequency, hesitancy or incontinence MS: Denies muscle aches, joint pain or swelling Neuro: Denies headache, neurologic deficits (focal weakness, numbness, tingling), abnormal gait Psych:  Denies anxiety, depression, SI/HI/AVH Skin: Denies new rashes or lesions ID: Denies sick contacts, exotic exposures, travel  Examination:  General exam: Appears calm and comfortable  Respiratory system: Clear to auscultation. Respiratory effort  normal. Cardiovascular system: S1 & S2 heard, RRR. No JVD, murmurs, rubs, gallops or clicks. No pedal edema. Gastrointestinal system: Abdomen is nondistended, soft and nontender. No organomegaly or masses felt. Normal bowel sounds heard. Central nervous system: Alert and oriented. No focal neurological deficits. Extremities: Symmetric 5 x 5 power. Skin: No rashes, lesions or ulcers Psychiatry: Judgement and insight appear normal. Mood & affect appropriate.    Objective: Vitals:   05/13/20 2200 05/13/20 2304 05/14/20 0300 05/14/20 0700  BP: 116/74 139/61 103/64 110/68  Pulse: 75 69 95 76  Resp: 18 16 17 14   Temp: 97.9 F (36.6 C) 97.7 F (36.5 C) 97.9 F (36.6 C) 98 F (36.7 C)  TempSrc:  Oral Oral Oral  SpO2: 95% 92% 93%   Weight:  50.5 kg    Height:  5\' 5"  (1.651 m)      Intake/Output Summary (Last 24 hours) at 05/14/2020 0802 Last data filed at 05/13/2020 1437 Gross per 24 hour  Intake 286.36 ml  Output --  Net 286.36 ml   Filed Weights   05/13/20 2304  Weight: 50.5 kg     Data Reviewed:   CBC: Recent Labs  Lab 05/13/20 0719 05/14/20 0042  WBC 9.1 7.9  NEUTROABS 6.7  --   HGB 14.6 13.4  HCT 43.2 39.4  MCV 97.5 96.8  PLT 220 741   Basic Metabolic Panel: Recent Labs  Lab 05/13/20 0719 05/14/20 0042  NA 143 138  K 2.8* 2.7*  CL 104 102  CO2 26 29  GLUCOSE 132* 114*  BUN 9 8  CREATININE 0.45 0.58  CALCIUM 8.3* 7.7*  MG 1.9  --    GFR: Estimated Creatinine Clearance: 38 mL/min (by C-G formula based on SCr of 0.58 mg/dL). Liver Function Tests: Recent Labs  Lab 05/13/20 0719 05/14/20 0042  AST 30 27  ALT 30 30  ALKPHOS 69 64  BILITOT 1.1 1.5*  PROT 5.1* 4.6*  ALBUMIN 2.5* 2.2*   No results for input(s): LIPASE, AMYLASE in the last 168 hours. No results for input(s): AMMONIA in the last 168 hours. Coagulation Profile: No results for input(s): INR, PROTIME in the last 168 hours. Cardiac Enzymes: Recent Labs  Lab 05/13/20 0719  CKTOTAL  32*   BNP (last 3 results) No results for input(s): PROBNP in the last 8760 hours. HbA1C: No results for input(s): HGBA1C in the last 72 hours. CBG: No results for input(s): GLUCAP in the last 168 hours. Lipid Profile: No results for input(s): CHOL, HDL, LDLCALC, TRIG, CHOLHDL, LDLDIRECT in the last 72 hours. Thyroid Function Tests: No results for input(s): TSH, T4TOTAL, FREET4, T3FREE, THYROIDAB in the last 72 hours. Anemia Panel: No results for input(s): VITAMINB12, FOLATE, FERRITIN, TIBC, IRON, RETICCTPCT in the last 72 hours. Sepsis Labs: No results for input(s): PROCALCITON, LATICACIDVEN in the last 168 hours.  Recent Results (from the past 240 hour(s))  Resp Panel by RT-PCR (Flu A&B, Covid) Nasopharyngeal Swab     Status: None   Collection Time: 05/13/20 11:31 AM   Specimen: Nasopharyngeal Swab; Nasopharyngeal(NP) swabs in vial transport medium  Result Value Ref Range Status   SARS Coronavirus 2 by RT PCR NEGATIVE NEGATIVE Final    Comment: (NOTE) SARS-CoV-2 target nucleic acids are NOT DETECTED.  The SARS-CoV-2 RNA is generally detectable in  upper respiratory specimens during the acute phase of infection. The lowest concentration of SARS-CoV-2 viral copies this assay can detect is 138 copies/mL. A negative result does not preclude SARS-Cov-2 infection and should not be used as the sole basis for treatment or other patient management decisions. A negative result may occur with  improper specimen collection/handling, submission of specimen other than nasopharyngeal swab, presence of viral mutation(s) within the areas targeted by this assay, and inadequate number of viral copies(<138 copies/mL). A negative result must be combined with clinical observations, patient history, and epidemiological information. The expected result is Negative.  Fact Sheet for Patients:  EntrepreneurPulse.com.au  Fact Sheet for Healthcare Providers:   IncredibleEmployment.be  This test is no t yet approved or cleared by the [place] FDA and  has been authorized for detection and/or diagnosis of SARS-CoV-2 by FDA under an Emergency Use Authorization (EUA). This EUA will remain  in effect (meaning this test can be used) for the duration of the COVID-19 declaration under Section 564(b)(1) of the Act, 21 U.S.C.section 360bbb-3(b)(1), unless the authorization is terminated  or revoked sooner.       Influenza A by PCR NEGATIVE NEGATIVE Final   Influenza B by PCR NEGATIVE NEGATIVE Final    Comment: (NOTE) The Xpert Xpress SARS-CoV-2/FLU/RSV plus assay is intended as an aid in the diagnosis of influenza from Nasopharyngeal swab specimens and should not be used as a sole basis for treatment. Nasal washings and aspirates are unacceptable for Xpert Xpress SARS-CoV-2/FLU/RSV testing.  Fact Sheet for Patients: EntrepreneurPulse.com.au  Fact Sheet for Healthcare Providers: IncredibleEmployment.be  This test is not yet approved or cleared by the [place] FDA and has been authorized for detection and/or diagnosis of SARS-CoV-2 by FDA under an Emergency Use Authorization (EUA). This EUA will remain in effect (meaning this test can be used) for the duration of the COVID-19 declaration under Section 564(b)(1) of the Act, 21 U.S.C. section 360bbb-3(b)(1), unless the authorization is terminated or revoked.  Performed at [facility], [place] [address] [place], [place] [address]   MRSA PCR Screening     Status: None   Collection Time: 05/13/20 11:08 PM   Specimen: Nasopharyngeal  Result Value Ref Range Status   MRSA by PCR NEGATIVE NEGATIVE Final    Comment:        The GeneXpert MRSA Assay (FDA approved for NASAL specimens only), is one component of a comprehensive MRSA colonization surveillance program. It is not intended to diagnose MRSA infection nor  to guide or monitor treatment for MRSA infections. Performed at [facility], [place] [address] [place], [place] [address]          Radiology Studies: DG Chest 2 View  Result Date: 05/13/2020 CLINICAL DATA:  [place]. EXAM: CHEST - 2 VIEW COMPARISON:  04/20/2019 FINDINGS: Left base atelectasis or infiltrate noted with probable small left pleural effusion. Skin fold overlies the lower left hemithorax. Right lung clear. Cardiopericardial silhouette is at upper limits of normal for size. Degenerative changes noted in both shoulders. Telemetry leads overlie the chest. IMPRESSION: Left base atelectasis or infiltrate with probable small left pleural effusion. Electronically Signed   By: Misty Stanley M.D.   On: 05/13/2020 08:06   DG Ribs Unilateral Left  Result Date: 05/13/2020 CLINICAL DATA:  Unwitnessed fall left-sided pain EXAM: LEFT RIBS - 2 VIEW COMPARISON:  Chest x-ray from earlier today FINDINGS: Left lateral seventh, eighth, ninth, tenth, and eleventh rib fractures. The ninth rib fracture is displaced. Atelectasis  without pneumothorax or large pleural effusion. Thoracolumbar spine degeneration with scoliosis. IMPRESSION: Left seventh through eleventh rib fractures with up to mild displacement. Electronically Signed   By: Monte Fantasia M.D.   On: 05/13/2020 10:18   CT Head Wo Contrast  Result Date: 05/13/2020 CLINICAL DATA:  Unwitnessed fall EXAM: CT HEAD WITHOUT CONTRAST TECHNIQUE: Contiguous axial images were obtained from the base of the skull through the vertex without intravenous contrast. COMPARISON:  08/08/2016 FINDINGS: Brain: There is atrophy and chronic small vessel disease changes. No acute intracranial abnormality. Specifically, no hemorrhage, hydrocephalus, mass lesion, acute infarction, or significant intracranial injury. Vascular: No hyperdense vessel or unexpected calcification. Skull: No acute calvarial abnormality. Sinuses/Orbits: No acute findings Other: None IMPRESSION:  Atrophy, chronic microvascular disease. No acute intracranial abnormality. Electronically Signed   By: Rolm Baptise M.D.   On: 05/13/2020 08:43   CT Chest W Contrast  Result Date: 05/13/2020 CLINICAL DATA:  Pain following fall EXAM: CT CHEST WITH CONTRAST TECHNIQUE: Multidetector CT imaging of the chest was performed during intravenous contrast administration. CONTRAST:  75mL OMNIPAQUE IOHEXOL 300 MG/ML  SOLN COMPARISON:  Chest CT April 20, 2019; rib radiographs May 13, 2020 FINDINGS: Cardiovascular: No thoracic aortic aneurysm or dissection is evident. Foci of calcification noted in visualized great vessels. There is aortic atherosclerosis. There is apparent intramural hematoma in the posterior aortic extending from the arch to the midportion causing approximately 50% diameter narrowing. No ulceration in this area. No mediastinal hematoma evident. No plaque ulceration is appreciable by CT. There are foci of coronary artery calcification. There is no pericardial thickening. A fairly small amount of pericardial fluid is noted superiorly which is somewhat similar to prior study and may be within physiologic range. No major vessel pulmonary embolus. Mediastinum/Nodes: Thyroid appears unremarkable. No appreciable thoracic adenopathy. There is a paraesophageal hernia extending to the left, also present previously. Lungs/Pleura: There is atelectatic change in each lung base, more severe on the left than on the right with small amount of pleural fluid on the left. Mild consolidation also noted in the left base. No appreciable pneumothorax on the left. No pulmonary edema appreciable. Trachea and major bronchial structures appear patent. Upper Abdomen: There is hepatic steatosis. There is upper abdominal aortic atherosclerosis. Visualized liver and spleen appear intact. Musculoskeletal: There are fractures of the lateral left fifth, lateral seventh, lateral eighth, lateral ninth, and lateral tenth ribs on the left  with displacement of lateral left ninth and tenth ribs. Questionable nondisplaced fracture lateral left sixth rib. No blastic or lytic bone lesions are evident. There is degenerative change in the thoracic spine. No appreciable chest wall lesions. Previously noted mass in the right breast no longer evident. IMPRESSION: 1. Multiple rib fractures on the left. Small left pleural effusion but no pneumothorax. 2. Bibasilar atelectasis, more severe on the left than the right with questionable developing infiltrate left base. 3. Apparent intramural hematoma in the aorta posteriorly involving the arch and proximal to mid descending aorta. No aneurysm or dissection. No mediastinal hematoma. There is aortic atherosclerosis. 4.  No adenopathy. 5.  Paraesophageal hernia again noted. 6.  Hepatic steatosis. 7. Fairly small amount of pericardial fluid superiorly, stable compared to the February 2021 study. Aortic Atherosclerosis (ICD10-I70.0). Electronically Signed   By: Lowella Grip III M.D.   On: 05/13/2020 12:09   CT Cervical Spine Wo Contrast  Result Date: 05/13/2020 CLINICAL DATA:  Unwitnessed fall. EXAM: CT CERVICAL SPINE WITHOUT CONTRAST TECHNIQUE: Multidetector CT imaging of the cervical spine was performed  without intravenous contrast. Multiplanar CT image reconstructions were also generated. COMPARISON:  08/08/2016 FINDINGS: Alignment: Slight anterolisthesis of C4 on C5 related to facet disease. No subluxation. Skull base and vertebrae: No acute fracture. No primary bone lesion or focal pathologic process. Soft tissues and spinal canal: No prevertebral fluid or swelling. No visible canal hematoma. Disc levels: Diffuse advanced degenerative disc disease and facet disease. Upper chest: No acute findings Other: None IMPRESSION: Advanced cervical spondylosis. No acute bony abnormality. Electronically Signed   By: Rolm Baptise M.D.   On: 05/13/2020 08:45   DG Hip Unilat W or Wo Pelvis 2-3 Views Left  Result Date:  05/13/2020 CLINICAL DATA:  Unwitnessed fall.  Pain. EXAM: DG HIP (WITH OR WITHOUT PELVIS) 2-3V LEFT COMPARISON:  None. FINDINGS: There is no evidence of hip fracture or dislocation. There is no evidence of arthropathy or other focal bone abnormality. IMPRESSION: Negative. Electronically Signed   By: Misty Stanley M.D.   On: 05/13/2020 08:04        Scheduled Meds: . acetaminophen  650 mg Oral Q6H  . docusate sodium  100 mg Oral BID  . potassium chloride  40 mEq Oral BID  . potassium chloride  20 mEq Oral BID   Continuous Infusions: . cefTRIAXone (ROCEPHIN)  IV Stopped (05/13/20 1437)     LOS: 1 day   Time spent= 35 mins    Deontay Ladnier Arsenio Loader, MD [facility]  If 7PM-7AM, please contact night-coverage  05/14/2020, 8:02 AM

## 2020-05-14 NOTE — Progress Notes (Addendum)
  Progress Note    05/14/2020 7:27 AM Hospital Day 1  Subjective:  Sleeping-wakes easily.  Denies any abdominal or chest pain.    Afebrile HR 70's-80's 44'Y-185'U systolic 31% RA  Vitals:   05/13/20 2304 05/14/20 0300  BP: 139/61 103/64  Pulse: 69 95  Resp: 16 17  Temp: 97.7 F (36.5 C) 97.9 F (36.6 C)  SpO2: 92% 93%    Physical Exam: General:  No distress; denies pain Lungs:  Non labored Abdomen:  Soft, NT/ND Neuro:  In tact; moving all extremities equally and following commands  CBC    Component Value Date/Time   WBC 7.9 05/14/2020 0042   RBC 4.07 05/14/2020 0042   HGB 13.4 05/14/2020 0042   HGB 15.1 (H) 06/11/2019 0841   HCT 39.4 05/14/2020 0042   PLT 208 05/14/2020 0042   PLT 211 06/11/2019 0841   MCV 96.8 05/14/2020 0042   MCH 32.9 05/14/2020 0042   MCHC 34.0 05/14/2020 0042   RDW 13.2 05/14/2020 0042   LYMPHSABS 1.6 05/13/2020 0719   MONOABS 0.7 05/13/2020 0719   EOSABS 0.1 05/13/2020 0719   BASOSABS 0.1 05/13/2020 0719    BMET    Component Value Date/Time   NA 138 05/14/2020 0042   K 2.7 (LL) 05/14/2020 0042   CL 102 05/14/2020 0042   CO2 29 05/14/2020 0042   GLUCOSE 114 (H) 05/14/2020 0042   BUN 8 05/14/2020 0042   CREATININE 0.58 05/14/2020 0042   CREATININE 0.71 06/11/2019 0841   CALCIUM 7.7 (L) 05/14/2020 0042   GFRNONAA >60 05/14/2020 0042   GFRNONAA >60 06/11/2019 0841   GFRAA >60 08/15/2019 1321   GFRAA >60 06/11/2019 0841    INR    Component Value Date/Time   INR 1.14 04/17/2010 1735     Intake/Output Summary (Last 24 hours) at 05/14/2020 0727 Last data filed at 05/13/2020 1437 Gross per 24 hour  Intake 286.36 ml  Output --  Net 286.36 ml     Assessment/Plan:  87 y.o. female admitted wit unwitnessed fall found to have aortic [place] [facility] Day 1  -pt without chest or abdominal pain this morning.  Her BP is well controlled at this time.  Plan to repeat CTA chest abdomen pelvis this weekend.    Leontine Locket,  PA-C [facility] [phone] 05/14/2020 7:27 AM  I have independently interviewed and examined patient and agree with PA assessment and plan above.  Extremities warm and well perfused with palpable pulses repeat CTA this weekend.  I discussed plan with the patient I am unsure that she fully comprehends but I also had this conversation with her family yesterday  Erlene Quan C. Donzetta Matters, MD [facility] [place] Office: [phone] Pager: [phone]

## 2020-05-14 NOTE — Progress Notes (Signed)
Trauma/Critical Care Follow Up Note  Subjective:    Overnight Issues:   Objective:  Vital signs for last 24 hours: Temp:  [97.7 F (36.5 C)-98.3 F (36.8 C)] 97.7 F (36.5 C) (03/18 1200) Pulse Rate:  [64-95] 83 (03/18 1200) Resp:  [14-23] 20 (03/18 1200) BP: (99-139)/(56-94) 108/65 (03/18 1200) SpO2:  [90 %-97 %] 93 % (03/18 1142) Weight:  [50.5 kg] 50.5 kg (03/17 2304)  Hemodynamic parameters for last 24 hours:    Intake/Output from previous day: 03/17 0701 - 03/18 0700 In: 286.4 [IV Piggyback:286.4] Out: -   Intake/Output this shift: Total I/O In: -  Out: 1100 [Urine:1100]  Vent settings for last 24 hours:    Physical Exam:  Gen: comfortable, no distress Neuro: non-focal exam HEENT: PERRL Neck: supple CV: RRR Pulm: unlabored breathing Abd: soft, NT GU: clear yellow urine Extr: wwp, no edema   Results for orders placed or performed during the hospital encounter of 05/13/20 (from the past 24 hour(s))  MRSA PCR Screening     Status: None   Collection Time: 05/13/20 11:08 PM   Specimen: Nasopharyngeal  Result Value Ref Range   MRSA by PCR NEGATIVE NEGATIVE  Comprehensive metabolic panel     Status: Abnormal   Collection Time: 05/14/20 12:42 AM  Result Value Ref Range   Sodium 138 135 - 145 mmol/L   Potassium 2.7 (LL) 3.5 - 5.1 mmol/L   Chloride 102 98 - 111 mmol/L   CO2 29 22 - 32 mmol/L   Glucose, Bld 114 (H) 70 - 99 mg/dL   BUN 8 8 - 23 mg/dL   Creatinine, Ser 0.58 0.44 - 1.00 mg/dL   Calcium 7.7 (L) 8.9 - 10.3 mg/dL   Total Protein 4.6 (L) 6.5 - 8.1 g/dL   Albumin 2.2 (L) 3.5 - 5.0 g/dL   AST 27 15 - 41 U/L   ALT 30 0 - 44 U/L   Alkaline Phosphatase 64 38 - 126 U/L   Total Bilirubin 1.5 (H) 0.3 - 1.2 mg/dL   GFR, Estimated >60 >60 mL/min   Anion gap 7 5 - 15  CBC     Status: None   Collection Time: 05/14/20 12:42 AM  Result Value Ref Range   WBC 7.9 4.0 - 10.5 K/uL   RBC 4.07 3.87 - 5.11 MIL/uL   Hemoglobin 13.4 12.0 - 15.0 g/dL   HCT  39.4 36.0 - 46.0 %   MCV 96.8 80.0 - 100.0 fL   MCH 32.9 26.0 - 34.0 pg   MCHC 34.0 30.0 - 36.0 g/dL   RDW 13.2 11.5 - 15.5 %   Platelets 208 150 - 400 K/uL   nRBC 0.0 0.0 - 0.2 %  TSH     Status: None   Collection Time: 05/14/20  8:17 AM  Result Value Ref Range   TSH 1.306 0.350 - 4.500 uIU/mL  VITAMIN D 25 Hydroxy (Vit-D Deficiency, Fractures)     Status: None   Collection Time: 05/14/20  8:17 AM  Result Value Ref Range   Vit D, 25-Hydroxy 44.31 30 - 100 ng/mL  CK     Status: None   Collection Time: 05/14/20  8:17 AM  Result Value Ref Range   Total CK 55 38 - 234 U/L    Assessment & Plan:  Present on Admission: . Fall . MDD (major depressive disorder) . Osteoporosis    LOS: 1 day   Additional comments:I reviewed the patient's new clinical lab test results.   and  I reviewed the patients new imaging test results.    Fall, unwitnessed  Left 7-11 rib fxs - pain control, IS, pulm toilet, PT/OT eval for mobilization. Repeat CXR in am.  Otherwise these should heal on their own in 6-8 weeks.  k-pad/ice prn Aortic arch and proximal descending aortic intramural hematoma - not present on last CTA ~1y ago. Likely 2/2 trauma, [facility] c/s, Dr. Donzetta Matters. Recommend BP control and repeat scan in next 48 hrs UTI - Rocephin, per medicine Anxiety/Depression - per medicine Dementia -  per medicine Breast cancer - s/p lumpectomy on oral chemotherapy DNR FEN - HH diet  VTE - SCDs, hold in setting of aortic hematoma to assure stability ID - Rocephin for UTI Tertiary trauma survey - negative for additional injury Dispo - per primary team, trauma to sign off at this time, please re-consult for additional questions/concerns  Jesusita Oka, MD [facility] Please use [email or web address] to contact on call provider  05/14/2020  *Care during the described time interval was provided by me. I have reviewed this patient's available data, including medical history, events of note, physical  examination and test results as part of my evaluation.

## 2020-05-15 ENCOUNTER — Other Ambulatory Visit: Payer: Self-pay | Admitting: Oncology

## 2020-05-15 DIAGNOSIS — I5022 Chronic systolic (congestive) heart failure: Secondary | ICD-10-CM

## 2020-05-15 DIAGNOSIS — S2242XD Multiple fractures of ribs, left side, subsequent encounter for fracture with routine healing: Secondary | ICD-10-CM

## 2020-05-15 LAB — LIPID PANEL
Cholesterol: 169 mg/dL (ref 0–200)
HDL: 70 mg/dL (ref >40)
LDL Cholesterol: 86 mg/dL (ref 0–99)
Total CHOL/HDL Ratio: 2.4 RATIO
Triglycerides: 63 mg/dL (ref <150)
VLDL: 13 mg/dL (ref 0–40)

## 2020-05-15 LAB — CBC
HCT: 36.9 % (ref 36.0–46.0)
Hemoglobin: 12.9 g/dL (ref 12.0–15.0)
MCH: 33.8 pg (ref 26.0–34.0)
MCHC: 35 g/dL (ref 30.0–36.0)
MCV: 96.6 fL (ref 80.0–100.0)
Platelets: 199 10*3/uL (ref 150–400)
RBC: 3.82 MIL/uL — ABNORMAL LOW (ref 3.87–5.11)
RDW: 13.3 % (ref 11.5–15.5)
WBC: 6.2 10*3/uL (ref 4.0–10.5)
nRBC: 0 % (ref 0.0–0.2)

## 2020-05-15 LAB — HEMOGLOBIN A1C
Hgb A1c MFr Bld: 5.9 % — ABNORMAL HIGH (ref 4.8–5.6)
Mean Plasma Glucose: 122.63 mg/dL

## 2020-05-15 LAB — BASIC METABOLIC PANEL
Anion gap: 4 — ABNORMAL LOW (ref 5–15)
BUN: 10 mg/dL (ref 8–23)
CO2: 28 mmol/L (ref 22–32)
Calcium: 8 mg/dL — ABNORMAL LOW (ref 8.9–10.3)
Chloride: 105 mmol/L (ref 98–111)
Creatinine, Ser: 0.53 mg/dL (ref 0.44–1.00)
GFR, Estimated: >60 mL/min (ref >60)
Glucose, Bld: 110 mg/dL — ABNORMAL HIGH (ref 70–99)
Potassium: 4.9 mmol/L (ref 3.5–5.1)
Sodium: 137 mmol/L (ref 135–145)

## 2020-05-15 LAB — URINE CULTURE: Culture: >=100000 — AB

## 2020-05-15 LAB — MAGNESIUM: Magnesium: 1.8 mg/dL (ref 1.7–2.4)

## 2020-05-15 MED ORDER — LISINOPRIL 5 MG PO TABS
5.0000 mg | ORAL_TABLET | Freq: Every day | ORAL | Status: DC
  Administered 2020-05-15 – 2020-05-17 (×3): 5 mg via ORAL
  Filled 2020-05-15 (×5): qty 1

## 2020-05-15 MED ORDER — CARVEDILOL 3.125 MG PO TABS
3.1250 mg | ORAL_TABLET | Freq: Two times a day (BID) | ORAL | Status: DC
  Administered 2020-05-15 – 2020-05-19 (×7): 3.125 mg via ORAL
  Filled 2020-05-15 (×8): qty 1

## 2020-05-15 MED ORDER — ATORVASTATIN CALCIUM 10 MG PO TABS
20.0000 mg | ORAL_TABLET | Freq: Every day | ORAL | Status: DC
  Administered 2020-05-15 – 2020-05-19 (×5): 20 mg via ORAL
  Filled 2020-05-15 (×5): qty 2

## 2020-05-15 NOTE — Progress Notes (Signed)
PROGRESS NOTE    BRIHANA [name]  STM:[id number] DOB: 07-Oct-1929 DOA: 05/13/2020 PCP: System, Provider Not In   Brief Narrative:  87 year old with history of significant dementia, anxiety, depression admitted from SNF due to unwitnessed fall.  Unable to provide any history but there is concerned that she had hit her head and falling on her left side.  Trauma work-up showed multiple rib fractures, hypokalemia and intramural aortic thrombus.  Vascular and trauma team were consulted.  CT of the head and neck were negative.  She was also found to have UTI started on Rocephin.   Assessment & Plan:   Active Problems:   Osteoporosis   MDD (major depressive disorder)   Malignant neoplasm of upper-outer quadrant of right breast in female, estrogen receptor positive ([place])   Fall   Rib fracture   Intramural hematoma of thoracic aorta (HCC)   Pressure injury of skin  Unwitnessed fall Left-sided rib fractures 7-11, closed -Plan will be for pain control, bowel regimen. -Seen by trauma service, signed off.  CT head and neck is negative.  No surgical intervention at this time.  Incentive spirometer -Supportive care.  Eventually will get PT/OT -Vitamin D-normal  Aortic arch proximal descending aortic intramural hematoma -Seen by vascular surgery, recommending blood pressure and pain control at this time.  Repeat CT abdomen pelvis per their service-tentative plans for Monday.  Acute congestive heart failure with reduced ejection fraction, EF 30-35% -Global hypokinesia.  Mild to moderate aortic valve -Patient is on Anastrozole, spoke with Dr Jana Hakim. She has not received any chemo that could the culprilt.  Spoke with Dr. Lovena Le from cardiology, recommend medical management starting patient on routine cardiac medication.  Will start patient on Coreg, lisinopril and statin.  Once hematoma stable, can also add aspirin.  Their service will arrange for outpatient  follow-up.  Hypokalemia -Repleted  Urinary tract infection -Empiric IV Rocephin. Ucx = E. coli, sensitivities pending  Anxiety/depression Dementia -Supportive care.  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  On anastrozole.  Follows with outpatient Dr. Jana Hakim   PT/OT-SNF   DVT prophylaxis: SCDs Start: 05/13/20 2319 Code Status: DNR Family Communication: Daughter at bedside  Status is: Inpatient  Remains inpatient appropriate because:Inpatient level of care appropriate due to severity of illness   Dispo: The patient is from: Home              Anticipated d/c is to: TBD              Patient currently is not medically stable to d/c.  Maintain hospital stay due to fall, significant electrolyte abnormality and refracture.  She also has aortic intramural hematoma requiring vascular evaluation.  Unsafe for discharge.  Will await for vascular clearance prior to discharge.   Difficult to place patient No       Body mass index is 18.53 kg/m.  Pressure Injury 05/13/20 Buttocks Left Stage 1 -  Intact skin with non-blanchable redness of a localized area usually over a bony prominence. Intact red and non blanchable area from prior Purewick malposition (Active)  05/13/20 2310  Location: Buttocks  Location Orientation: Left  Staging: Stage 1 -  Intact skin with non-blanchable redness of a localized area usually over a bony prominence.  Wound Description (Comments): Intact red and non blanchable area from prior Purewick malposition  Present on Admission: Yes    Subjective: Patient feels better today no complaints. Review of Systems Otherwise negative except as per HPI, including:  General: Denies fever, chills, night sweats or unintended weight loss. Resp: Denies cough, wheezing, shortness of breath. Cardiac: Denies chest pain, palpitations, orthopnea, paroxysmal nocturnal dyspnea. GI: Denies abdominal pain, nausea, vomiting,  diarrhea or constipation GU: Denies dysuria, frequency, hesitancy or incontinence MS: Denies muscle aches, joint pain or swelling Neuro: Denies headache, neurologic deficits (focal weakness, numbness, tingling), abnormal gait Psych: Denies anxiety, depression, SI/HI/AVH Skin: Denies new rashes or lesions ID: Denies sick contacts, exotic exposures, travel Examination:  Constitutional: Not in acute distress, elderly frail Respiratory: Clear to auscultation bilaterally Cardiovascular: Normal sinus rhythm, no rubs Abdomen: Nontender nondistended good bowel sounds Musculoskeletal: No edema noted Skin: No rashes seen Neurologic: CN 2-12 grossly intact.  And nonfocal Psychiatric: Normal judgment and insight. Alert and oriented x 3. Normal mood.  Objective: Vitals:   05/15/20 0000 05/15/20 0300 05/15/20 0400 05/15/20 0727  BP: 124/66 117/82 124/73 (!) 132/116  Pulse: 71 78 71 80  Resp: 20 (!) 23 20 (!) 30  Temp:  98 F (36.7 C)  98 F (36.7 C)  TempSrc:  Oral  Oral  SpO2: 93% 93% 92% 91%  Weight:      Height:        Intake/Output Summary (Last 24 hours) at 05/15/2020 0804 Last data filed at 05/14/2020 1600 Gross per 24 hour  Intake 101.34 ml  Output 1100 ml  Net -998.66 ml   Filed Weights   05/13/20 2304  Weight: 50.5 kg     Data Reviewed:   CBC: Recent Labs  Lab 05/13/20 0719 05/14/20 0042 05/15/20 0025  WBC 9.1 7.9 6.2  NEUTROABS 6.7  --   --   HGB 14.6 13.4 12.9  HCT 43.2 39.4 36.9  MCV 97.5 96.8 96.6  PLT 220 208 237   Basic Metabolic Panel: Recent Labs  Lab 05/13/20 0719 05/14/20 0042 05/14/20 1606 05/15/20 0025  NA 143 138 135 137  K 2.8* 2.7* 4.8 4.9  CL 104 102 104 105  CO2 26 29 24 28   GLUCOSE 132* 114* 203* 110*  BUN 9 8 10 10   CREATININE 0.45 0.58 0.75 0.53  CALCIUM 8.3* 7.7* 7.9* 8.0*  MG 1.9  --  1.9 1.8   GFR: Estimated Creatinine Clearance: 38 mL/min (by C-G formula based on SCr of 0.53 mg/dL). Liver Function Tests: Recent Labs   Lab 05/13/20 0719 05/14/20 0042  AST 30 27  ALT 30 30  ALKPHOS 69 64  BILITOT 1.1 1.5*  PROT 5.1* 4.6*  ALBUMIN 2.5* 2.2*   No results for input(s): LIPASE, AMYLASE in the last 168 hours. No results for input(s): AMMONIA in the last 168 hours. Coagulation Profile: No results for input(s): INR, PROTIME in the last 168 hours. Cardiac Enzymes: Recent Labs  Lab 05/13/20 0719 05/14/20 0817  CKTOTAL 32* 55   BNP (last 3 results) No results for input(s): PROBNP in the last 8760 hours. HbA1C: No results for input(s): HGBA1C in the last 72 hours. CBG: No results for input(s): GLUCAP in the last 168 hours. Lipid Profile: No results for input(s): CHOL, HDL, LDLCALC, TRIG, CHOLHDL, LDLDIRECT in the last 72 hours. Thyroid Function Tests: Recent Labs    05/14/20 0817  TSH 1.306   Anemia Panel: No results for input(s): VITAMINB12, FOLATE, FERRITIN, TIBC, IRON, RETICCTPCT in the last 72 hours. Sepsis Labs: No results for input(s): PROCALCITON, LATICACIDVEN in the last 168 hours.  Recent Results (from the past 240 hour(s))  Urine culture     Status: Abnormal (Preliminary result)   Collection  Time: 05/13/20 10:11 AM   Specimen: Urine, Clean Catch  Result Value Ref Range Status   Specimen Description   Final    URINE, CLEAN CATCH Performed at [facility], [place] [address] [place], [place] [address]    Special Requests   Final    NONE Performed at [facility], [place] [address] [place], [place] [address]    Culture (A)  Final    >=100,000 COLONIES/mL ESCHERICHIA COLI SUSCEPTIBILITIES TO FOLLOW Performed at [facility], [place] [address] [place], [place] [address]    Report Status PENDING  Incomplete  Resp Panel by RT-PCR (Flu A&B, Covid) Nasopharyngeal Swab     Status: None   Collection Time: 05/13/20 11:31 AM   Specimen: Nasopharyngeal Swab; Nasopharyngeal(NP) swabs in vial transport medium  Result Value Ref Range Status   SARS  Coronavirus 2 by RT PCR NEGATIVE NEGATIVE Final    Comment: (NOTE) SARS-CoV-2 target nucleic acids are NOT DETECTED.  The SARS-CoV-2 RNA is generally detectable in upper respiratory specimens during the acute phase of infection. The lowest concentration of SARS-CoV-2 viral copies this assay can detect is 138 copies/mL. A negative result does not preclude SARS-Cov-2 infection and should not be used as the sole basis for treatment or other patient management decisions. A negative result may occur with  improper specimen collection/handling, submission of specimen other than nasopharyngeal swab, presence of viral mutation(s) within the areas targeted by this assay, and inadequate number of viral copies(<138 copies/mL). A negative result must be combined with clinical observations, patient history, and epidemiological information. The expected result is Negative.  Fact Sheet for Patients:  EntrepreneurPulse.com.au  Fact Sheet for Healthcare Providers:  IncredibleEmployment.be  This test is no t yet approved or cleared by the [place] FDA and  has been authorized for detection and/or diagnosis of SARS-CoV-2 by FDA under an Emergency Use Authorization (EUA). This EUA will remain  in effect (meaning this test can be used) for the duration of the COVID-19 declaration under Section 564(b)(1) of the Act, 21 U.S.C.section 360bbb-3(b)(1), unless the authorization is terminated  or revoked sooner.       Influenza A by PCR NEGATIVE NEGATIVE Final   Influenza B by PCR NEGATIVE NEGATIVE Final    Comment: (NOTE) The Xpert Xpress SARS-CoV-2/FLU/RSV plus assay is intended as an aid in the diagnosis of influenza from Nasopharyngeal swab specimens and should not be used as a sole basis for treatment. Nasal washings and aspirates are unacceptable for Xpert Xpress SARS-CoV-2/FLU/RSV testing.  Fact Sheet for  Patients: EntrepreneurPulse.com.au  Fact Sheet for Healthcare Providers: IncredibleEmployment.be  This test is not yet approved or cleared by the [place] FDA and has been authorized for detection and/or diagnosis of SARS-CoV-2 by FDA under an Emergency Use Authorization (EUA). This EUA will remain in effect (meaning this test can be used) for the duration of the COVID-19 declaration under Section 564(b)(1) of the Act, 21 U.S.C. section 360bbb-3(b)(1), unless the authorization is terminated or revoked.  Performed at [facility], [place] [address], [place], [place] [address]   MRSA PCR Screening     Status: None   Collection Time: 05/13/20 11:08 PM   Specimen: Nasopharyngeal  Result Value Ref Range Status   MRSA by PCR NEGATIVE NEGATIVE Final    Comment:        The GeneXpert MRSA Assay (FDA approved for NASAL specimens only), is one component of a comprehensive MRSA colonization surveillance program. It is not  intended to diagnose MRSA infection nor to guide or monitor treatment for MRSA infections. Performed at [facility], [place] [address] [place], [place] [address]          Radiology Studies: DG Ribs Unilateral Left  Result Date: 05/13/2020 CLINICAL DATA:  Unwitnessed fall left-sided pain EXAM: LEFT RIBS - 2 VIEW COMPARISON:  Chest x-ray from earlier today FINDINGS: Left lateral seventh, eighth, ninth, tenth, and eleventh rib fractures. The ninth rib fracture is displaced. Atelectasis without pneumothorax or large pleural effusion. Thoracolumbar spine degeneration with scoliosis. IMPRESSION: Left seventh through eleventh rib fractures with up to mild displacement. Electronically Signed   By: Monte Fantasia M.D.   On: 05/13/2020 10:18   CT Head Wo Contrast  Result Date: 05/13/2020 CLINICAL DATA:  Unwitnessed fall EXAM: CT HEAD WITHOUT CONTRAST TECHNIQUE: Contiguous axial images were obtained from the base  of the skull through the vertex without intravenous contrast. COMPARISON:  08/08/2016 FINDINGS: Brain: There is atrophy and chronic small vessel disease changes. No acute intracranial abnormality. Specifically, no hemorrhage, hydrocephalus, mass lesion, acute infarction, or significant intracranial injury. Vascular: No hyperdense vessel or unexpected calcification. Skull: No acute calvarial abnormality. Sinuses/Orbits: No acute findings Other: None IMPRESSION: Atrophy, chronic microvascular disease. No acute intracranial abnormality. Electronically Signed   By: Rolm Baptise M.D.   On: 05/13/2020 08:43   CT Chest W Contrast  Result Date: 05/13/2020 CLINICAL DATA:  Pain following fall EXAM: CT CHEST WITH CONTRAST TECHNIQUE: Multidetector CT imaging of the chest was performed during intravenous contrast administration. CONTRAST:  18mL OMNIPAQUE IOHEXOL 300 MG/ML  SOLN COMPARISON:  Chest CT April 20, 2019; rib radiographs May 13, 2020 FINDINGS: Cardiovascular: No thoracic aortic aneurysm or dissection is evident. Foci of calcification noted in visualized great vessels. There is aortic atherosclerosis. There is apparent intramural hematoma in the posterior aortic extending from the arch to the midportion causing approximately 50% diameter narrowing. No ulceration in this area. No mediastinal hematoma evident. No plaque ulceration is appreciable by CT. There are foci of coronary artery calcification. There is no pericardial thickening. A fairly small amount of pericardial fluid is noted superiorly which is somewhat similar to prior study and may be within physiologic range. No major vessel pulmonary embolus. Mediastinum/Nodes: Thyroid appears unremarkable. No appreciable thoracic adenopathy. There is a paraesophageal hernia extending to the left, also present previously. Lungs/Pleura: There is atelectatic change in each lung base, more severe on the left than on the right with small amount of pleural fluid on the  left. Mild consolidation also noted in the left base. No appreciable pneumothorax on the left. No pulmonary edema appreciable. Trachea and major bronchial structures appear patent. Upper Abdomen: There is hepatic steatosis. There is upper abdominal aortic atherosclerosis. Visualized liver and spleen appear intact. Musculoskeletal: There are fractures of the lateral left fifth, lateral seventh, lateral eighth, lateral ninth, and lateral tenth ribs on the left with displacement of lateral left ninth and tenth ribs. Questionable nondisplaced fracture lateral left sixth rib. No blastic or lytic bone lesions are evident. There is degenerative change in the thoracic spine. No appreciable chest wall lesions. Previously noted mass in the right breast no longer evident. IMPRESSION: 1. Multiple rib fractures on the left. Small left pleural effusion but no pneumothorax. 2. Bibasilar atelectasis, more severe on the left than the right with questionable developing infiltrate left base. 3. Apparent intramural hematoma in the aorta posteriorly involving the arch and proximal to mid descending aorta. No aneurysm or dissection. No mediastinal  hematoma. There is aortic atherosclerosis. 4.  No adenopathy. 5.  Paraesophageal hernia again noted. 6.  Hepatic steatosis. 7. Fairly small amount of pericardial fluid superiorly, stable compared to the February 2021 study. Aortic Atherosclerosis (ICD10-I70.0). Electronically Signed   By: Lowella Grip III M.D.   On: 05/13/2020 12:09   CT Cervical Spine Wo Contrast  Result Date: 05/13/2020 CLINICAL DATA:  Unwitnessed fall. EXAM: CT CERVICAL SPINE WITHOUT CONTRAST TECHNIQUE: Multidetector CT imaging of the cervical spine was performed without intravenous contrast. Multiplanar CT image reconstructions were also generated. COMPARISON:  08/08/2016 FINDINGS: Alignment: Slight anterolisthesis of C4 on C5 related to facet disease. No subluxation. Skull base and vertebrae: No acute fracture. No  primary bone lesion or focal pathologic process. Soft tissues and spinal canal: No prevertebral fluid or swelling. No visible canal hematoma. Disc levels: Diffuse advanced degenerative disc disease and facet disease. Upper chest: No acute findings Other: None IMPRESSION: Advanced cervical spondylosis. No acute bony abnormality. Electronically Signed   By: Rolm Baptise M.D.   On: 05/13/2020 08:45   DG CHEST PORT 1 VIEW  Result Date: 05/14/2020 CLINICAL DATA:  87 year old female with chest pain. Fall with minimally displaced left lateral rib fractures. EXAM: PORTABLE CHEST 1 VIEW COMPARISON:  Chest CT yesterday. Chest radiographs 05/13/2020 and earlier. FINDINGS: Portable AP upright view at 0606 hours. Left lateral lower rib fractures as demonstrated by CT. No pneumothorax. Stable left lung atelectasis. No significant pleural effusion is evident. Stable cardiac size and mediastinal contours. Right lung base atelectasis appears regressed. Stable right chest wall surgical clips. Negative visible bowel gas pattern. Stable visualized osseous structures. IMPRESSION: Left lower rib fractures with stable left lung base atelectasis. No pneumothorax. Electronically Signed   By: Genevie Ann M.D.   On: 05/14/2020 08:31   ECHOCARDIOGRAM COMPLETE  Result Date: 05/14/2020    ECHOCARDIOGRAM REPORT   Patient Name:   Becky [name] Date of Exam: 05/14/2020 Medical Rec #:  [id number]      Height:       65.0 in Accession #:    [id number]     Weight:       111.3 lb Date of Birth:  11-20-1929      BSA:          1.542 m Patient Age:    72 years       BP:           108/65 mmHg Patient Gender: F              HR:           83 bpm. Exam Location:  Inpatient Procedure: 2D Echo Indications:    syncope  History:        Patient has no prior history of Echocardiogram examinations.                 COPD.  Sonographer:    Johny Chess Referring Phys: [id number] Jonnie Finner  Sonographer Comments: Left rib fractures. IMPRESSIONS  1. Left  ventricular ejection fraction, by estimation, is 30 to 35%. The left ventricle has moderately decreased function. The left ventricle demonstrates global hypokinesis. Left ventricular diastolic parameters are indeterminate.  2. Right ventricular systolic function is normal. The right ventricular size is normal.  3. The mitral valve is normal in structure. No evidence of mitral valve regurgitation. No evidence of mitral stenosis.  4. The aortic valve has an indeterminant number of cusps. Aortic valve regurgitation is mild. Mild to moderate aortic valve sclerosis/calcification  is present, without any evidence of aortic stenosis.  5. The inferior vena cava is normal in size with greater than 50% respiratory variability, suggesting right atrial pressure of 3 mmHg. FINDINGS  Left Ventricle: Left ventricular ejection fraction, by estimation, is 30 to 35%. The left ventricle has moderately decreased function. The left ventricle demonstrates global hypokinesis. The left ventricular internal cavity size was normal in size. There is no left ventricular hypertrophy. Abnormal (paradoxical) septal motion, consistent with left bundle branch block. Left ventricular diastolic parameters are indeterminate. Right Ventricle: The right ventricular size is normal. N Right ventricular systolic function is normal. Left Atrium: Left atrial size was normal in size. Right Atrium: Right atrial size was normal in size. Pericardium: There is no evidence of pericardial effusion. Mitral Valve: The mitral valve is normal in structure. Mild mitral annular calcification. No evidence of mitral valve regurgitation. No evidence of mitral valve stenosis. Tricuspid Valve: The tricuspid valve is normal in structure. Tricuspid valve regurgitation is trivial. No evidence of tricuspid stenosis. Aortic Valve: The aortic valve has an indeterminant number of cusps. Aortic valve regurgitation is mild. Aortic regurgitation PHT measures 251 msec. Mild to moderate  aortic valve sclerosis/calcification is present, without any evidence of aortic stenosis. Pulmonic Valve: The pulmonic valve was not well visualized. Pulmonic valve regurgitation is not visualized. No evidence of pulmonic stenosis. Aorta: The aortic root is normal in size and structure. Venous: The inferior vena cava is normal in size with greater than 50% respiratory variability, suggesting right atrial pressure of 3 mmHg.  LEFT VENTRICLE PLAX 2D LVIDd:         4.80 cm     Diastology LVIDs:         4.00 cm     LV e' lateral: 5.00 cm/s LV PW:         0.80 cm LV IVS:        0.70 cm LVOT diam:     1.80 cm LV SV:         29 LV SV Index:   19 LVOT Area:     2.54 cm  LV Volumes (MOD) LV vol d, MOD A2C: 68.3 ml LV vol d, MOD A4C: 59.9 ml LV vol s, MOD A2C: 42.4 ml LV vol s, MOD A4C: 46.6 ml LV SV MOD A2C:     25.9 ml LV SV MOD A4C:     59.9 ml LV SV MOD BP:      20.6 ml RIGHT VENTRICLE             IVC RV S prime:     10.70 cm/s  IVC diam: 1.30 cm LEFT ATRIUM           Index LA Vol (A2C): 30.2 ml 19.58 ml/m LA Vol (A4C): 31.2 ml 20.23 ml/m  AORTIC VALVE LVOT Vmax:   69.90 cm/s LVOT Vmean:  42.800 cm/s LVOT VTI:    0.115 m AI PHT:      251 msec  AORTA Ao Root diam: 3.50 cm Ao Asc diam:  3.30 cm  SHUNTS Systemic VTI:  0.12 m Systemic Diam: 1.80 cm Kirk Ruths MD Electronically signed by Kirk Ruths MD Signature Date/Time: 05/14/2020/5:15:49 PM    Final         Scheduled Meds: . acetaminophen  650 mg Oral Q6H  . anastrozole  1 mg Oral Daily  . cholestyramine  1 packet Oral BID  . docusate sodium  100 mg Oral BID  . lidocaine  1 patch Transdermal  Q24H  . predniSONE  2.5 mg Oral Daily  . sertraline  50 mg Oral Daily   Continuous Infusions: . cefTRIAXone (ROCEPHIN)  IV 200 mL/hr at 05/14/20 1600     LOS: 2 days   Time spent= 35 mins    Trenton Passow Arsenio Loader, MD [facility]  If 7PM-7AM, please contact night-coverage  05/15/2020, 8:04 AM

## 2020-05-15 NOTE — Progress Notes (Signed)
CTA reviewed difficult to know if this is acute area of dissection vs chronic atherosclerosis with thrombus.  Will obtain repeat CTA chest abdomen pelvis on Monday.  Call if questions or clinical deterioration.  Ruta Hinds, MD [facility] Office: [phone]

## 2020-05-15 NOTE — Progress Notes (Signed)
Pt was able to transfer to the chair from bed with assist and with moderate pain on her left side of the back. Pt was able to sit for 2-3 hours, tolerated well, tylenol is helping to control her pain. Spirometer is provided and taught how to use it, pt is able to go upto 250 this point and goal is made to 500. RT called to provide flutter valve.  Palma Holter, RN

## 2020-05-16 ENCOUNTER — Inpatient Hospital Stay (HOSPITAL_COMMUNITY): Payer: Medicare Other

## 2020-05-16 DIAGNOSIS — L8990 Pressure ulcer of unspecified site, unspecified stage: Secondary | ICD-10-CM

## 2020-05-16 DIAGNOSIS — I2699 Other pulmonary embolism without acute cor pulmonale: Secondary | ICD-10-CM

## 2020-05-16 LAB — BASIC METABOLIC PANEL
Anion gap: 6 (ref 5–15)
BUN: 11 mg/dL (ref 8–23)
CO2: 25 mmol/L (ref 22–32)
Calcium: 8 mg/dL — ABNORMAL LOW (ref 8.9–10.3)
Chloride: 102 mmol/L (ref 98–111)
Creatinine, Ser: 0.55 mg/dL (ref 0.44–1.00)
GFR, Estimated: >60 mL/min (ref >60)
Glucose, Bld: 110 mg/dL — ABNORMAL HIGH (ref 70–99)
Potassium: 4.1 mmol/L (ref 3.5–5.1)
Sodium: 133 mmol/L — ABNORMAL LOW (ref 135–145)

## 2020-05-16 LAB — CBC
HCT: 36.2 % (ref 36.0–46.0)
Hemoglobin: 12.7 g/dL (ref 12.0–15.0)
MCH: 33.9 pg (ref 26.0–34.0)
MCHC: 35.1 g/dL (ref 30.0–36.0)
MCV: 96.5 fL (ref 80.0–100.0)
Platelets: 200 10*3/uL (ref 150–400)
RBC: 3.75 MIL/uL — ABNORMAL LOW (ref 3.87–5.11)
RDW: 13.3 % (ref 11.5–15.5)
WBC: 6.9 10*3/uL (ref 4.0–10.5)
nRBC: 0 % (ref 0.0–0.2)

## 2020-05-16 LAB — HEPARIN LEVEL (UNFRACTIONATED): Heparin Unfractionated: 0.67 IU/mL (ref 0.30–0.70)

## 2020-05-16 LAB — MAGNESIUM: Magnesium: 1.8 mg/dL (ref 1.7–2.4)

## 2020-05-16 MED ORDER — HEPARIN BOLUS VIA INFUSION
2000.0000 [IU] | Freq: Once | INTRAVENOUS | Status: AC
  Administered 2020-05-16: 2000 [IU] via INTRAVENOUS
  Filled 2020-05-16: qty 2000

## 2020-05-16 MED ORDER — CEPHALEXIN 500 MG PO CAPS
500.0000 mg | ORAL_CAPSULE | Freq: Three times a day (TID) | ORAL | Status: AC
  Administered 2020-05-16 – 2020-05-18 (×8): 500 mg via ORAL
  Filled 2020-05-16 (×9): qty 1

## 2020-05-16 MED ORDER — IOHEXOL 350 MG/ML SOLN
100.0000 mL | Freq: Once | INTRAVENOUS | Status: AC | PRN
  Administered 2020-05-16: 100 mL via INTRAVENOUS

## 2020-05-16 MED ORDER — HEPARIN (PORCINE) 25000 UT/250ML-% IV SOLN
750.0000 [IU]/h | INTRAVENOUS | Status: DC
  Administered 2020-05-16: 800 [IU]/h via INTRAVENOUS
  Filled 2020-05-16: qty 250

## 2020-05-16 NOTE — Progress Notes (Addendum)
PROGRESS NOTE    Becky [name]  OIZ:[id number] DOB: [birth year] DOA: 05/13/2020 PCP: System, Provider Not In   Brief Narrative:  87 year old with history of significant dementia, anxiety, depression admitted from SNF due to unwitnessed fall.  Unable to provide any history but there is concerned that she had hit her head and falling on her left side.  Trauma work-up showed multiple rib fractures, hypokalemia and intramural aortic thrombus.  Vascular and trauma team were consulted.  CT of the head and neck were negative.  She was also found to have UTI started on Rocephin.   Assessment & Plan:   Principal Problem:   Rib fracture Active Problems:   Osteoporosis   MDD (major depressive disorder)   Malignant neoplasm of upper-outer quadrant of right breast in female, estrogen receptor positive ([facility])   Fall   Intramural hematoma of thoracic aorta (HCC)   Pressure injury of skin   Chronic systolic CHF (congestive heart failure) ([place])   Pulmonary embolism (HCC)  Unwitnessed fall Left-sided rib fractures 7-11, closed -Plan will be for pain control, bowel regimen. -Seen by trauma service, signed off.  CT head and neck is negative.  No surgical intervention at this time.  Incentive spirometer -Supportive care.  PT/OT = SNF -Vitamin D-normal  Aortic arch proximal descending aortic intramural hematoma -Seen by vascular surgery, recommending blood pressure and pain control at this time.  Repeat CT abdomen pelvis per their service-per Vascular  Right Sided PE, without cor pulmonale  Seen on CT today, new findings. Discussed with Dr Oneida Alar, ok for Heparin drip.   Acute congestive heart failure with reduced ejection fraction, EF 30-35% -Global hypokinesia.  Mild to moderate aortic valve -Patient is on Anastrozole, spoke with Dr Jana Hakim. She has not received any chemo that could the culprilt.  Spoke with Dr. Lovena Le from cardiology, recommend medical management starting patient on routine  cardiac medication.  Started patient on Coreg, lisinopril and statin.  Once hematoma stable, can also add aspirin.  Their service will arrange for outpatient follow-up. -A1c 5.9, LDL 86  Hypokalemia -Repleted  Urinary tract infection -Transition to p.o. Keflex.  Last day 3/22.  Ucx = E. coli, pansensitive  Anxiety/depression Dementia -Supportive care.  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  On anastrozole.  Follows with outpatient Dr. Jana Hakim   PT/OT-SNF   DVT prophylaxis: SCDs Start: 05/13/20 2319 Code Status: DNR Family Communication: daughter at bedside   Status is: Inpatient  Remains inpatient appropriate because:Inpatient level of care appropriate due to severity of illness   Dispo: The patient is from: Home              Anticipated d/c is to: TBD              Patient currently is not medically stable to d/c.  Maintain hospital stay due to fall, significant electrolyte abnormality and refracture.  She also has aortic intramural hematoma requiring vascular evaluation.  Unsafe for discharge.  Will await for vascular clearance prior to discharge.   Difficult to place patient No       Body mass index is 18.53 kg/m.  Pressure Injury 05/13/20 Buttocks Left Stage 1 -  Intact skin with non-blanchable redness of a localized area usually over a bony prominence. Intact red and non blanchable area from prior Purewick malposition (Active)  05/13/20 2310  Location: Buttocks  Location Orientation: Left  Staging: Stage 1 -  Intact skin with non-blanchable redness of a localized  area usually over a bony prominence.  Wound Description (Comments): Intact red and non blanchable area from prior Purewick malposition  Present on Admission: Yes    Subjective: Feels ok, sat in the chair for 3 hrs yesterday.   Review of Systems Otherwise negative except as per HPI, including: General = no fevers, chills, dizziness,   fatigue HEENT/EYES = negative for loss of vision, double vision, blurred vision,  sore throa Cardiovascular= negative for chest pain, palpitation Respiratory/lungs= negative for shortness of breath, cough, wheezing; hemoptysis,  Gastrointestinal= negative for nausea, vomiting, abdominal pain Genitourinary= negative for Dysuria MSK = Negative for arthralgia, myalgias Neurology= Negative for headache, numbness, tingling  Psychiatry= Negative for suicidal and homocidal ideation Skin= Negative for Rash   Examination: Constitutional: Not in acute distress Respiratory: Clear to auscultation bilaterally Cardiovascular: Normal sinus rhythm, no rubs Abdomen: Nontender nondistended good bowel sounds Musculoskeletal: No edema noted Skin: No rashes seen Neurologic: CN 2-12 grossly intact.  And nonfocal Psychiatric: Normal judgment and insight. Alert and oriented x 3. Normal mood.     Objective: Vitals:   05/15/20 2300 05/16/20 0300 05/16/20 0734 05/16/20 1139  BP: 109/68 128/82 125/88 106/67  Pulse: 73 78 69 84  Resp: 19 18 18 18   Temp: 98 F (36.7 C) 97.8 F (36.6 C) 98.4 F (36.9 C) 98.4 F (36.9 C)  TempSrc: Oral Oral Oral Oral  SpO2: 92% 93% 94% 92%  Weight:      Height:        Intake/Output Summary (Last 24 hours) at 05/16/2020 1536 Last data filed at 05/16/2020 1200 Gross per 24 hour  Intake 600 ml  Output 400 ml  Net 200 ml   Filed Weights   05/13/20 2304  Weight: 50.5 kg     Data Reviewed:   CBC: Recent Labs  Lab 05/13/20 0719 05/14/20 0042 05/15/20 0025 05/16/20 0059  WBC 9.1 7.9 6.2 6.9  NEUTROABS 6.7  --   --   --   HGB 14.6 13.4 12.9 12.7  HCT 43.2 39.4 36.9 36.2  MCV 97.5 96.8 96.6 96.5  PLT 220 208 199 053   Basic Metabolic Panel: Recent Labs  Lab 05/13/20 0719 05/14/20 0042 05/14/20 1606 05/15/20 0025 05/16/20 0059  NA 143 138 135 137 133*  K 2.8* 2.7* 4.8 4.9 4.1  CL 104 102 104 105 102  CO2 26 29 24 28 25   GLUCOSE 132* 114* 203*  110* 110*  BUN 9 8 10 10 11   CREATININE 0.45 0.58 0.75 0.53 0.55  CALCIUM 8.3* 7.7* 7.9* 8.0* 8.0*  MG 1.9  --  1.9 1.8 1.8   GFR: Estimated Creatinine Clearance: 38 mL/min (by C-G formula based on SCr of 0.55 mg/dL). Liver Function Tests: Recent Labs  Lab 05/13/20 0719 05/14/20 0042  AST 30 27  ALT 30 30  ALKPHOS 69 64  BILITOT 1.1 1.5*  PROT 5.1* 4.6*  ALBUMIN 2.5* 2.2*   No results for input(s): LIPASE, AMYLASE in the last 168 hours. No results for input(s): AMMONIA in the last 168 hours. Coagulation Profile: No results for input(s): INR, PROTIME in the last 168 hours. Cardiac Enzymes: Recent Labs  Lab 05/13/20 0719 05/14/20 0817  CKTOTAL 32* 55   BNP (last 3 results) No results for input(s): PROBNP in the last 8760 hours. HbA1C: Recent Labs    05/15/20 1136  HGBA1C 5.9*   CBG: No results for input(s): GLUCAP in the last 168 hours. Lipid Profile: Recent Labs    05/15/20 0114  CHOL  169  HDL 70  LDLCALC 86  TRIG 63  CHOLHDL 2.4   Thyroid Function Tests: Recent Labs    05/14/20 0817  TSH 1.306   Anemia Panel: No results for input(s): VITAMINB12, FOLATE, FERRITIN, TIBC, IRON, RETICCTPCT in the last 72 hours. Sepsis Labs: No results for input(s): PROCALCITON, LATICACIDVEN in the last 168 hours.  Recent Results (from the past 240 hour(s))  Urine culture     Status: Abnormal   Collection Time: 05/13/20 10:11 AM   Specimen: Urine, Clean Catch  Result Value Ref Range Status   Specimen Description   Final    URINE, CLEAN CATCH Performed at [facility], [place] [address], [place], [place] [address]    Special Requests   Final    NONE Performed at [facility], [place] [address], [place], [place] [address]    Culture >=100,000 COLONIES/mL ESCHERICHIA COLI (A)  Final   Report Status 05/15/2020 FINAL  Final   Organism ID, Bacteria ESCHERICHIA COLI (A)  Final      Susceptibility   Escherichia coli - MIC*     AMPICILLIN 8 SENSITIVE Sensitive     CEFAZOLIN <=4 SENSITIVE Sensitive     CEFEPIME <=0.12 SENSITIVE Sensitive     CEFTRIAXONE <=0.25 SENSITIVE Sensitive     CIPROFLOXACIN <=0.25 SENSITIVE Sensitive     GENTAMICIN <=1 SENSITIVE Sensitive     IMIPENEM <=0.25 SENSITIVE Sensitive     NITROFURANTOIN <=16 SENSITIVE Sensitive     TRIMETH/SULFA <=20 SENSITIVE Sensitive     AMPICILLIN/SULBACTAM <=2 SENSITIVE Sensitive     PIP/TAZO <=4 SENSITIVE Sensitive     * >=100,000 COLONIES/mL ESCHERICHIA COLI  Resp Panel by RT-PCR (Flu A&B, Covid) Nasopharyngeal Swab     Status: None   Collection Time: 05/13/20 11:31 AM   Specimen: Nasopharyngeal Swab; Nasopharyngeal(NP) swabs in vial transport medium  Result Value Ref Range Status   SARS Coronavirus 2 by RT PCR NEGATIVE NEGATIVE Final    Comment: (NOTE) SARS-CoV-2 target nucleic acids are NOT DETECTED.  The SARS-CoV-2 RNA is generally detectable in upper respiratory specimens during the acute phase of infection. The lowest concentration of SARS-CoV-2 viral copies this assay can detect is 138 copies/mL. A negative result does not preclude SARS-Cov-2 infection and should not be used as the sole basis for treatment or other patient management decisions. A negative result may occur with  improper specimen collection/handling, submission of specimen other than nasopharyngeal swab, presence of viral mutation(s) within the areas targeted by this assay, and inadequate number of viral copies(<138 copies/mL). A negative result must be combined with clinical observations, patient history, and epidemiological information. The expected result is Negative.  Fact Sheet for Patients:  EntrepreneurPulse.com.au  Fact Sheet for Healthcare Providers:  IncredibleEmployment.be  This test is no t yet approved or cleared by the [place] FDA and  has been authorized for detection and/or diagnosis of SARS-CoV-2 by FDA under an  Emergency Use Authorization (EUA). This EUA will remain  in effect (meaning this test can be used) for the duration of the COVID-19 declaration under Section 564(b)(1) of the Act, 21 U.S.C.section 360bbb-3(b)(1), unless the authorization is terminated  or revoked sooner.       Influenza A by PCR NEGATIVE NEGATIVE Final   Influenza B by PCR NEGATIVE NEGATIVE Final    Comment: (NOTE) The Xpert Xpress SARS-CoV-2/FLU/RSV plus assay is intended as an aid in the diagnosis of influenza from Nasopharyngeal swab specimens and should not be used as a sole  basis for treatment. Nasal washings and aspirates are unacceptable for Xpert Xpress SARS-CoV-2/FLU/RSV testing.  Fact Sheet for Patients: EntrepreneurPulse.com.au  Fact Sheet for Healthcare Providers: IncredibleEmployment.be  This test is not yet approved or cleared by the [place] FDA and has been authorized for detection and/or diagnosis of SARS-CoV-2 by FDA under an Emergency Use Authorization (EUA). This EUA will remain in effect (meaning this test can be used) for the duration of the COVID-19 declaration under Section 564(b)(1) of the Act, 21 U.S.C. section 360bbb-3(b)(1), unless the authorization is terminated or revoked.  Performed at [facility], [place] [address], [place], [place] [address]   MRSA PCR Screening     Status: None   Collection Time: 05/13/20 11:08 PM   Specimen: Nasopharyngeal  Result Value Ref Range Status   MRSA by PCR NEGATIVE NEGATIVE Final    Comment:        The GeneXpert MRSA Assay (FDA approved for NASAL specimens only), is one component of a comprehensive MRSA colonization surveillance program. It is not intended to diagnose MRSA infection nor to guide or monitor treatment for MRSA infections. Performed at [facility], [place] [address] [place] [address] [place], [place] [address]          Radiology Studies: ECHOCARDIOGRAM COMPLETE  Result  Date: 05/14/2020    ECHOCARDIOGRAM REPORT   Patient Name:   Becky [name] Date of Exam: 05/14/2020 Medical Rec #:  [id number]      Height:       65.0 in Accession #:    [id number]     Weight:       111.3 lb Date of Birth:  [birth year]      BSA:          1.542 m Patient Age:    6 years       BP:           108/65 mmHg Patient Gender: F              HR:           83 bpm. Exam Location:  Inpatient Procedure: 2D Echo Indications:    syncope  History:        Patient has no prior history of Echocardiogram examinations.                 COPD.  Sonographer:    Johny Chess Referring Phys: [id number] Jonnie Finner  Sonographer Comments: Left rib fractures. IMPRESSIONS  1. Left ventricular ejection fraction, by estimation, is 30 to 35%. The left ventricle has moderately decreased function. The left ventricle demonstrates global hypokinesis. Left ventricular diastolic parameters are indeterminate.  2. Right ventricular systolic function is normal. The right ventricular size is normal.  3. The mitral valve is normal in structure. No evidence of mitral valve regurgitation. No evidence of mitral stenosis.  4. The aortic valve has an indeterminant number of cusps. Aortic valve regurgitation is mild. Mild to moderate aortic valve sclerosis/calcification is present, without any evidence of aortic stenosis.  5. The inferior vena cava is normal in size with greater than 50% respiratory variability, suggesting right atrial pressure of 3 mmHg. FINDINGS  Left Ventricle: Left ventricular ejection fraction, by estimation, is 30 to 35%. The left ventricle has moderately decreased function. The left ventricle demonstrates global hypokinesis. The left ventricular internal cavity size was normal in size. There is no left ventricular hypertrophy. Abnormal (paradoxical) septal motion, consistent with left bundle branch block. Left ventricular diastolic parameters are indeterminate.  Right Ventricle: The right ventricular size is normal. N Right  ventricular systolic function is normal. Left Atrium: Left atrial size was normal in size. Right Atrium: Right atrial size was normal in size. Pericardium: There is no evidence of pericardial effusion. Mitral Valve: The mitral valve is normal in structure. Mild mitral annular calcification. No evidence of mitral valve regurgitation. No evidence of mitral valve stenosis. Tricuspid Valve: The tricuspid valve is normal in structure. Tricuspid valve regurgitation is trivial. No evidence of tricuspid stenosis. Aortic Valve: The aortic valve has an indeterminant number of cusps. Aortic valve regurgitation is mild. Aortic regurgitation PHT measures 251 msec. Mild to moderate aortic valve sclerosis/calcification is present, without any evidence of aortic stenosis. Pulmonic Valve: The pulmonic valve was not well visualized. Pulmonic valve regurgitation is not visualized. No evidence of pulmonic stenosis. Aorta: The aortic root is normal in size and structure. Venous: The inferior vena cava is normal in size with greater than 50% respiratory variability, suggesting right atrial pressure of 3 mmHg.  LEFT VENTRICLE PLAX 2D LVIDd:         4.80 cm     Diastology LVIDs:         4.00 cm     LV e' lateral: 5.00 cm/s LV PW:         0.80 cm LV IVS:        0.70 cm LVOT diam:     1.80 cm LV SV:         29 LV SV Index:   19 LVOT Area:     2.54 cm  LV Volumes (MOD) LV vol d, MOD A2C: 68.3 ml LV vol d, MOD A4C: 59.9 ml LV vol s, MOD A2C: 42.4 ml LV vol s, MOD A4C: 46.6 ml LV SV MOD A2C:     25.9 ml LV SV MOD A4C:     59.9 ml LV SV MOD BP:      20.6 ml RIGHT VENTRICLE             IVC RV S prime:     10.70 cm/s  IVC diam: 1.30 cm LEFT ATRIUM           Index LA Vol (A2C): 30.2 ml 19.58 ml/m LA Vol (A4C): 31.2 ml 20.23 ml/m  AORTIC VALVE LVOT Vmax:   69.90 cm/s LVOT Vmean:  42.800 cm/s LVOT VTI:    0.115 m AI PHT:      251 msec  AORTA Ao Root diam: 3.50 cm Ao Asc diam:  3.30 cm  SHUNTS Systemic VTI:  0.12 m Systemic Diam: 1.80 cm Kirk Ruths MD Electronically signed by Kirk Ruths MD Signature Date/Time: 05/14/2020/5:15:49 PM    Final    CT Angio Chest/Abd/Pel for Dissection W and/or W/WO  Result Date: 05/16/2020 CLINICAL DATA:  Abdominal and back pain, possible aortic dissection EXAM: CT ANGIOGRAPHY CHEST, ABDOMEN AND PELVIS TECHNIQUE: Multidetector CT imaging through the chest, abdomen and pelvis was performed using the standard protocol during bolus administration of intravenous contrast. Multiplanar reconstructed images and MIPs were obtained and reviewed to evaluate the vascular anatomy. CONTRAST:  172mL OMNIPAQUE IOHEXOL 350 MG/ML SOLN COMPARISON:  05/13/2020 and previous FINDINGS: CTA CHEST FINDINGS Cardiovascular: mild cardiomegaly. Small volume pericardial fluid. Good contrast opacification of the pulmonary arterial tree. Partially occlusive embolus, right upper lobe pulmonary artery extending into segmental branches. Subsegmental embolus, superior segment right lower lobe pulmonary artery branch. No definite contralateral emboli. There is good contrast opacification of the thoracic aorta. No dissection or  stenosis. Eccentric nonocclusive mural thrombus in the isthmus and proximal descending segment, stable. Aortic Root: --Valve: 2.4 cm --Sinuses: 3.7 cm --Sinotubular Junction: 3.2 cm Limitations by motion: Mild Thoracic Aorta: --Ascending Aorta: 3.6 cm --Aortic Arch: 3.5 cm --Descending Aorta: 3.6 cm Mediastinum/Nodes: Moderate hiatal hernia.  No mass or adenopathy. Lungs/Pleura: Small pleural effusions, left greater than right. No pneumothorax. Dependent subsegmental atelectasis in the right lower lobe. More extensive dependent and basilar atelectasis, left lower lobe. Musculoskeletal: Anterior vertebral endplate spurring at multiple levels in the mid and lower thoracic spine. Bilateral shoulder DJD. No fracture or worrisome bone lesion. Review of the MIP images confirms the above findings. CTA ABDOMEN AND PELVIS FINDINGS  VASCULAR Aorta: Mild tortuosity. Moderate calcified atheromatous plaque. No aneurysm, dissection, or stenosis. Celiac: Patent without evidence of aneurysm, dissection, vasculitis or significant stenosis. SMA: Partially calcified ostial plaque without significant stenosis, patent distally with classic branch anatomy. Renals: Single left with partially calcified plaque in its mid and distal segment, no high-grade stenosis. Duplicated right, superior dominant with partially calcified plaque just beyond the ostium resulting in short segment stenosis of at least mild severity, patent distally. IMA: Patent without evidence of aneurysm, dissection, vasculitis or significant stenosis. Inflow: Mild tortuosity. Scattered calcified plaque. No aneurysm, dissection, or stenosis. Veins: No obvious venous abnormality within the limitations of this arterial phase study. Review of the MIP images confirms the above findings. NON-VASCULAR Hepatobiliary: No liver lesion or biliary ductal dilatation. 1.8 cm partially calcified stone in the nondistended gallbladder. Pancreas: 1.1 cm well-demarcated cystic lesion in the midbody. No ductal dilatation. Spleen: Lobular contour, without enlargement or focal lesion. Adrenals/Urinary Tract: Bilateral adrenal hypertrophy. 1.2 cm partially exophytic right midpole low-attenuation lesion, nonspecific. No hydronephrosis. Urinary bladder physiologically distended. Stomach/Bowel: Moderate hiatal hernia. Stomach is nondistended. Small bowel decompressed. Appendix not discretely identified. Colon nondilated. Scattered descending colonic diverticula without adjacent inflammatory change. Lymphatic: No abdominal or pelvic adenopathy. Reproductive: Uterus and bilateral adnexa are unremarkable. Other: No ascites.  Bilateral pelvic phleboliths.  No free air. Musculoskeletal: Small paraumbilical hernia containing only mesenteric fat. Lumbar scoliosis, multilevel lumbar spondylitic change with grade 1  anterolisthesis L4-5 and L5-S1 probably related to facet DJD; no pars defect. Sclerosis in bilateral femoral heads without subchondral collapse. No fracture or worrisome bone lesion. Review of the MIP images confirms the above findings. IMPRESSION: 1. Right pulmonary emboli, new since 05/13/2020. No CT evidence of right heart strain or pulmonary infarct. 2. Stable eccentric nonocclusive mural thrombus in the aortic isthmus and proximal descending thoracic segment. No dissection. 3. Small bilateral pleural effusions, left greater than right. 4. Lumbar scoliosis and multilevel lumbar spondylitic change. 5. Cholelithiasis. 6. Moderate hiatal hernia. 7. Descending colonic diverticulosis. 8. 1.1 cm well-demarcated cystic lesion in the midbody of the pancreas. Recommend follow-up pancreatic-protocol CT or MRI in 2 years. Electronically Signed   By: Lucrezia Europe M.D.   On: 05/16/2020 14:34        Scheduled Meds: . acetaminophen  650 mg Oral Q6H  . anastrozole  1 mg Oral Daily  . atorvastatin  20 mg Oral Daily  . carvedilol  3.125 mg Oral BID WC  . cephALEXin  500 mg Oral Q8H  . cholestyramine  1 packet Oral BID  . docusate sodium  100 mg Oral BID  . lidocaine  1 patch Transdermal Q24H  . lisinopril  5 mg Oral Daily  . predniSONE  2.5 mg Oral Daily  . sertraline  50 mg Oral Daily   Continuous Infusions:  LOS: 3 days   Time spent= 35 mins    Grayden Burley Arsenio Loader, MD [facility]  If 7PM-7AM, please contact night-coverage  05/16/2020, 3:36 PM

## 2020-05-16 NOTE — Progress Notes (Addendum)
ANTICOAGULATION CONSULT NOTE - Initial Consult  Pharmacy Consult for Heparin Indication: pulmonary embolus  05/16/20  Allergies  Allergen Reactions  . Brimonidine     Unknown - Listed on [facility]    Patient Measurements: Height: 5\' 5"  (165.1 cm) Weight: 50.5 kg (111 lb 5.3 oz) IBW/kg (Calculated) : 57 Heparin Dosing Weight: 50kg  Vital Signs: Temp: 98.4 F (36.9 C) (03/20 1139) Temp Source: Oral (03/20 1139) BP: 106/67 (03/20 1139) Pulse Rate: 84 (03/20 1139)  Labs: Recent Labs    05/14/20 0042 05/14/20 0817 05/14/20 1606 05/15/20 0025 05/16/20 0059  HGB 13.4  --   --  12.9 12.7  HCT 39.4  --   --  36.9 36.2  PLT 208  --   --  199 200  CREATININE 0.58  --  0.75 0.53 0.55  CKTOTAL  --  55  --   --   --     Estimated Creatinine Clearance: 38 mL/min (by C-G formula based on SCr of 0.55 mg/dL).   Medical History: Past Medical History:  Diagnosis Date  . Arthritis   . COPD (chronic obstructive pulmonary disease) ([place])   . Dementia ([place])   . Depression    and anxiety  . Fibromyalgia   . Fuchs' corneal dystrophy    L eye  . Glaucoma    R eye  . Hyperlipidemia   . Migraines   . Osteoporosis   . Polio    PPS  (age 19 yr)  . Scoliosis   . Ulcer    small- stomach     Assessment: 87 Yo W with new right-sided PE 05/16/20, no heart strain. Also with stable nonocculsiv aortic mural thrombus without dissection. No anticoagulation prior to admit.   H/H , plt stable. On RA. Will give small bolus given age, weight and renal function.   Goal of Therapy:  Heparin level 0.3-0.7 units/ml Monitor platelets by anticoagulation protocol: Yes   Plan:  Heparin 2000 unit bolus then 800 units/hr  F/u 6hr HL  Monitor daily HL, CBC/plt Monitor for signs/symptoms of bleeding   ADDENDUM 22:50 : first HL 0.67 at high end of goal. Decrease slightly to 750 units/hr to prevent supratherapeutic level.    Benetta Spar, PharmD, BCPS, BCCP Clinical Pharmacist  Please check AMION for  all [facility] phone numbers After 10:00 PM, call [place] [phone]

## 2020-05-17 ENCOUNTER — Inpatient Hospital Stay (HOSPITAL_COMMUNITY): Payer: Medicare Other

## 2020-05-17 DIAGNOSIS — R609 Edema, unspecified: Secondary | ICD-10-CM

## 2020-05-17 DIAGNOSIS — I2699 Other pulmonary embolism without acute cor pulmonale: Secondary | ICD-10-CM

## 2020-05-17 LAB — HEPARIN LEVEL (UNFRACTIONATED): Heparin Unfractionated: 0.7 IU/mL (ref 0.30–0.70)

## 2020-05-17 LAB — CBC
HCT: 36.8 % (ref 36.0–46.0)
Hemoglobin: 12.5 g/dL (ref 12.0–15.0)
MCH: 33.2 pg (ref 26.0–34.0)
MCHC: 34 g/dL (ref 30.0–36.0)
MCV: 97.6 fL (ref 80.0–100.0)
Platelets: 198 10*3/uL (ref 150–400)
RBC: 3.77 MIL/uL — ABNORMAL LOW (ref 3.87–5.11)
RDW: 13.3 % (ref 11.5–15.5)
WBC: 7.8 10*3/uL (ref 4.0–10.5)
nRBC: 0 % (ref 0.0–0.2)

## 2020-05-17 LAB — MAGNESIUM: Magnesium: 1.8 mg/dL (ref 1.7–2.4)

## 2020-05-17 LAB — RESP PANEL BY RT-PCR (FLU A&B, COVID) ARPGX2
Influenza A by PCR: NEGATIVE
Influenza B by PCR: NEGATIVE
SARS Coronavirus 2 by RT PCR: NEGATIVE

## 2020-05-17 LAB — BASIC METABOLIC PANEL
Anion gap: 7 (ref 5–15)
BUN: 13 mg/dL (ref 8–23)
CO2: 20 mmol/L — ABNORMAL LOW (ref 22–32)
Calcium: 7.8 mg/dL — ABNORMAL LOW (ref 8.9–10.3)
Chloride: 106 mmol/L (ref 98–111)
Creatinine, Ser: 0.52 mg/dL (ref 0.44–1.00)
GFR, Estimated: >60 mL/min (ref >60)
Glucose, Bld: 101 mg/dL — ABNORMAL HIGH (ref 70–99)
Potassium: 3.8 mmol/L (ref 3.5–5.1)
Sodium: 133 mmol/L — ABNORMAL LOW (ref 135–145)

## 2020-05-17 MED ORDER — TRAMADOL HCL 50 MG PO TABS: 50.0000 mg | ORAL_TABLET | Freq: Four times a day (QID) | ORAL | 0 refills | Status: DC | PRN

## 2020-05-17 MED ORDER — APIXABAN 5 MG PO TABS
5.0000 mg | ORAL_TABLET | Freq: Two times a day (BID) | ORAL | Status: DC
  Administered 2020-05-17 – 2020-05-19 (×5): 5 mg via ORAL
  Filled 2020-05-17 (×5): qty 1

## 2020-05-17 MED ORDER — HYDROCODONE-ACETAMINOPHEN 5-325 MG PO TABS: 1.0000 | ORAL_TABLET | ORAL | 0 refills | Status: DC | PRN

## 2020-05-17 NOTE — Care Management Important Message (Signed)
Important Message  Patient Details  Name: PRINCESS KARNES MRN: [id number] Date of Birth: 03/03/1929   Medicare Important Message Given:  Yes     Ladaisha Portillo 05/17/2020, 3:22 PM

## 2020-05-17 NOTE — Progress Notes (Addendum)
VASCULAR SURGERY ASSESSMENT & PLAN:   87 y.o. female admitted wit unwitnessed fall found to have aortic [facility] Day 4 Follow-up CTA performed yesterday with new finding of PE. Heparin infusion initiated. BP well controlled (oral ACEI and BB). Stable CBC.  SUBJECTIVE:   She is awake and alert and denying CP, SOB, back or abdominal pain.  PHYSICAL EXAM:   Vitals:   05/17/20 0009 05/17/20 0015 05/17/20 0300 05/17/20 0401  BP: 98/79   111/65  Pulse: 82 75 75 75  Resp: (!) 24 18 16  (!) 25  Temp: 97.7 F (36.5 C)   97.7 F (36.5 C)  TempSrc: Oral   Oral  SpO2: 93% 90% 91% 90%  Weight:      Height:       General appearance: Awake, alert in no apparent distress Cardiac: Heart rate and rhythm are regular Respirations: Nonlabored Abdomen: ND, NT Extremities: Both feet are warm with intact sensation and motor function.  2-3+ left DP pulse. Weakly palpable right DP pulse. 2+ bilateral radial pulses  CTA 05/16/2020 IMPRESSION: 1. Right pulmonary emboli, new since 05/13/2020. No CT evidence of right heart strain or pulmonary infarct. 2. Stable eccentric nonocclusive mural thrombus in the aortic isthmus and proximal descending thoracic segment. No dissection. 3. Small bilateral pleural effusions, left greater than right. 4. Lumbar scoliosis and multilevel lumbar spondylitic change. 5. Cholelithiasis. 6. Moderate hiatal hernia. 7. Descending colonic diverticulosis. 8. 1.1 cm well-demarcated cystic lesion in the midbody of the pancreas. Recommend follow-up pancreatic-protocol CT or MRI in 2 years.  LABS:   Lab Results  Component Value Date   WBC 7.8 05/17/2020   HGB 12.5 05/17/2020   HCT 36.8 05/17/2020   MCV 97.6 05/17/2020   PLT 198 05/17/2020   Lab Results  Component Value Date   CREATININE 0.52 05/17/2020   Lab Results  Component Value Date   INR 1.14 04/17/2010   CBG (last 3)  No results for input(s): GLUCAP in the last 72 hours.  PROBLEM LIST:     Principal Problem:   Rib fracture Active Problems:   Osteoporosis   MDD (major depressive disorder)   Malignant neoplasm of upper-outer quadrant of right breast in female, estrogen receptor positive ([place])   Fall   Intramural hematoma of thoracic aorta (HCC)   Pressure injury of skin   Chronic systolic CHF (congestive heart failure) (HCC)   Pulmonary embolism (HCC)   CURRENT MEDS:   . acetaminophen  650 mg Oral Q6H  . anastrozole  1 mg Oral Daily  . atorvastatin  20 mg Oral Daily  . carvedilol  3.125 mg Oral BID WC  . cephALEXin  500 mg Oral Q8H  . cholestyramine  1 packet Oral BID  . docusate sodium  100 mg Oral BID  . lidocaine  1 patch Transdermal Q24H  . lisinopril  5 mg Oral Daily  . predniSONE  2.5 mg Oral Daily  . sertraline  50 mg Oral Daily   Barbie Banner, PA-C Office: [phone]  I have independently interviewed and examined patient and agree with PA assessment and plan above.  CT scan reviewed with stable appearing mural thrombus.  Unfortunately also has evidence of PE for which she will need anticoagulation.  There is no need for vascular invention at this time.  I will have her follow-up in approximately 2 months with repeat CT scan.  I discussed the potential for complications including extension of her mural thrombus that may lead to worsening back pain  or occlusive vascular disease and her daughter demonstrates good understanding of this and the symptoms to look out for.  Shequilla Goodgame C. Donzetta Matters, MD [facility] Office: [phone] Pager: [phone]  05/17/2020

## 2020-05-17 NOTE — Discharge Instructions (Signed)
Information on my medicine - ELIQUIS (apixaban)  This medication education was reviewed with me or my healthcare representative as part of my discharge preparation.  The pharmacist that spoke with me during my hospital stay was:  Palestine Mosco T Kathryn Cosby, RPH  Why was Eliquis prescribed for you? Eliquis was prescribed to treat blood clots that may have been found in the veins of your legs (deep vein thrombosis) or in your lungs (pulmonary embolism) and to reduce the risk of them occurring again.  What do You need to know about Eliquis ? The dose is ONE 5 mg tablet taken TWICE daily.  Eliquis may be taken with or without food.   Try to take the dose about the same time in the morning and in the evening. If you have difficulty swallowing the tablet whole please discuss with your pharmacist how to take the medication safely.  Take Eliquis exactly as prescribed and DO NOT stop taking Eliquis without talking to the doctor who prescribed the medication.  Stopping may increase your risk of developing a new blood clot.  Refill your prescription before you run out.  After discharge, you should have regular check-up appointments with your healthcare provider that is prescribing your Eliquis.    What do you do if you miss a dose? If a dose of ELIQUIS is not taken at the scheduled time, take it as soon as possible on the same day and twice-daily administration should be resumed. The dose should not be doubled to make up for a missed dose.  Important Safety Information A possible side effect of Eliquis is bleeding. You should call your healthcare provider right away if you experience any of the following: Bleeding from an injury or your nose that does not stop. Unusual colored urine (red or dark brown) or unusual colored stools (red or black). Unusual bruising for unknown reasons. A serious fall or if you hit your head (even if there is no bleeding).  Some medicines may interact with Eliquis and  might increase your risk of bleeding or clotting while on Eliquis. To help avoid this, consult your healthcare provider or pharmacist prior to using any new prescription or non-prescription medications, including herbals, vitamins, non-steroidal anti-inflammatory drugs (NSAIDs) and supplements.  This website has more information on Eliquis (apixaban): http://www.eliquis.com/eliquis/home  

## 2020-05-17 NOTE — Progress Notes (Addendum)
ANTICOAGULATION CONSULT NOTE - Initial Consult  Pharmacy Consult for apixaban Indication: pulmonary embolus  Allergies  Allergen Reactions  . Brimonidine     Unknown - Listed on [facility]    Patient Measurements: Height: 5\' 5"  (165.1 cm) Weight: 50.5 kg (111 lb 5.3 oz) IBW/kg (Calculated) : 57   Vital Signs: Temp: 97.4 F (36.3 C) (03/21 0807) Temp Source: Oral (03/21 0807) BP: 108/51 (03/21 0807) Pulse Rate: 90 (03/21 0807)  Labs: Recent Labs    05/15/20 0025 05/16/20 0059 05/16/20 2133 05/17/20 0026  HGB 12.9 12.7  --  12.5  HCT 36.9 36.2  --  36.8  PLT 199 200  --  198  HEPARINUNFRC  --   --  0.67 0.70  CREATININE 0.53 0.55  --  0.52    Estimated Creatinine Clearance: 38 mL/min (by C-G formula based on SCr of 0.52 mg/dL).   Medical History: Past Medical History:  Diagnosis Date  . Arthritis   . COPD (chronic obstructive pulmonary disease) ([place])   . Dementia ([place])   . Depression    and anxiety  . Fibromyalgia   . Fuchs' corneal dystrophy    L eye  . Glaucoma    R eye  . Hyperlipidemia   . Migraines   . Osteoporosis   . Polio    PPS  (age 35 yr)  . Scoliosis   . Ulcer    small- stomach     Assessment: 87 yoF admitted with fall found to have aortic IMH and acute PE. Heparin started overnight, now to transition to apixaban. Cr stable, LFTs wnl on admit, CBC stable. Discussed with hospitalist and [facility]VS - given intramural hematoma, age, low body weight will not load.  Plan:  Stop heparin Apixaban 5mg  BID  Arrie Senate, PharmD, BCPS, [facility] Clinical Pharmacist [phone] Please check AMION for all [place] numbers 05/17/2020

## 2020-05-17 NOTE — Progress Notes (Addendum)
PROGRESS NOTE    Becky [name]  QMG:[id number] DOB: Jul 13, 1929 DOA: 05/13/2020 PCP: System, Provider Not In   Brief Narrative:  87 year old with history of significant dementia, anxiety, depression admitted from SNF due to unwitnessed fall.  Unable to provide any history but there is concerned that she had hit her head and falling on her left side.  Trauma work-up showed multiple rib fractures, hypokalemia and intramural aortic thrombus.  Vascular and trauma team were consulted.  CT of the head and neck were negative.  She was also found to have UTI started on Rocephin. Repeat CT showed incidental new PE started on [facility].    Assessment & Plan:   Principal Problem:   Rib fracture Active Problems:   Osteoporosis   MDD (major depressive disorder)   Malignant neoplasm of upper-outer quadrant of right breast in female, estrogen receptor positive ([place])   Fall   Intramural hematoma of thoracic aorta (HCC)   Pressure injury of skin   Chronic systolic CHF (congestive heart failure) ([place])   Pulmonary embolism (HCC)  Unwitnessed fall Left-sided rib fractures 7-11, closed -Plan will be for pain control, bowel regimen. -Seen by trauma service, signed off.  CT head and neck is negative.  No surgical intervention at this time.  Incentive spirometer -Supportive care.  PT/OT = SNF -Vitamin D-normal  Aortic arch proximal descending aortic intramural hematoma -Seen by vascular surgery, recommending blood pressure and pain control at this time.  Repeat CT abdomen pelvis per their service-per Vascular  Right Sided PE, without cor pulmonale  Discussed with Vasc, ok for [facility]. Transition from Heparin to Eliquis.  LE dopplers- ordered  Acute congestive heart failure with reduced ejection fraction, EF 30-35% -Global hypokinesia.  Mild to moderate aortic valve -Patient is on Anastrozole, spoke with Dr Jana Hakim. She has not received any chemo that could the culprilt.  Spoke with Dr. Lovena Le from cardiology,  recommend medical management starting patient on routine cardiac medication.  Started patient on Coreg, lisinopril and statin.  Once hematoma stable, can also add aspirin.  Their service will arrange for outpatient follow-up. -A1c 5.9, LDL 86  Hypokalemia -Repleted  Urinary tract infection -Transition to p.o. Keflex.  Last day 3/22.  Ucx = E. coli, pansensitive  Anxiety/depression Dementia -Supportive care.  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  On anastrozole.  Follows with outpatient Dr. Jana Hakim   PT/OT-SNF   DVT prophylaxis: SCDs Start: 05/13/20 2319 Code Status: DNR Family Communication: daughter at bedside   Status is: Inpatient  Remains inpatient appropriate because:Inpatient level of care appropriate due to severity of illness   Dispo: The patient is from: Home              Anticipated d/c is to: TBD              Patient currently is medically stable for dc when bed available.    Difficult to place patient No       Body mass index is 18.53 kg/m.  Pressure Injury 05/13/20 Buttocks Left Stage 1 -  Intact skin with non-blanchable redness of a localized area usually over a bony prominence. Intact red and non blanchable area from prior Purewick malposition (Active)  05/13/20 2310  Location: Buttocks  Location Orientation: Left  Staging: Stage 1 -  Intact skin with non-blanchable redness of a localized area usually over a bony prominence.  Wound Description (Comments): Intact red and non blanchable area from prior Purewick malposition  Present  on Admission: Yes    Subjective:   Review of Systems Otherwise negative except as per HPI, including: General = no fevers, chills, dizziness,  fatigue HEENT/EYES = negative for loss of vision, double vision, blurred vision,  sore throa Cardiovascular= negative for chest pain, palpitation Respiratory/lungs= negative for shortness of breath, cough, wheezing;  hemoptysis,  Gastrointestinal= negative for nausea, vomiting, abdominal pain Genitourinary= negative for Dysuria MSK = Negative for arthralgia, myalgias Neurology= Negative for headache, numbness, tingling  Psychiatry= Negative for suicidal and homocidal ideation Skin= Negative for Rash   Examination: Constitutional: Not in acute distress; Elderly frail Respiratory: bibasilar rhonchi.  Cardiovascular: Normal sinus rhythm, no rubs Abdomen: Nontender nondistended good bowel sounds Musculoskeletal: No edema noted Skin: No rashes seen Neurologic: CN 2-12 grossly intact.  And nonfocal Psychiatric: Normal judgment and insight. Alert and oriented x 3. Normal mood.     Objective: Vitals:   05/17/20 0800 05/17/20 0807 05/17/20 1101 05/17/20 1111  BP:  (!) 108/51 (!) 146/72 120/64  Pulse: 82 90 78 81  Resp: 16 20 14 16   Temp:  (!) 97.4 F (36.3 C) 97.9 F (36.6 C) 97.9 F (36.6 C)  TempSrc:  Oral Oral Oral  SpO2: 94% 94% 96% 95%  Weight:      Height:        Intake/Output Summary (Last 24 hours) at 05/17/2020 1117 Last data filed at 05/17/2020 1102 Gross per 24 hour  Intake 662.55 ml  Output 1000 ml  Net -337.45 ml   Filed Weights   05/13/20 2304  Weight: 50.5 kg     Data Reviewed:   CBC: Recent Labs  Lab 05/13/20 0719 05/14/20 0042 05/15/20 0025 05/16/20 0059 05/17/20 0026  WBC 9.1 7.9 6.2 6.9 7.8  NEUTROABS 6.7  --   --   --   --   HGB 14.6 13.4 12.9 12.7 12.5  HCT 43.2 39.4 36.9 36.2 36.8  MCV 97.5 96.8 96.6 96.5 97.6  PLT 220 208 199 200 128   Basic Metabolic Panel: Recent Labs  Lab 05/13/20 0719 05/14/20 0042 05/14/20 1606 05/15/20 0025 05/16/20 0059 05/17/20 0026  NA 143 138 135 137 133* 133*  K 2.8* 2.7* 4.8 4.9 4.1 3.8  CL 104 102 104 105 102 106  CO2 26 29 24 28 25  20*  GLUCOSE 132* 114* 203* 110* 110* 101*  BUN 9 8 10 10 11 13   CREATININE 0.45 0.58 0.75 0.53 0.55 0.52  CALCIUM 8.3* 7.7* 7.9* 8.0* 8.0* 7.8*  MG 1.9  --  1.9 1.8 1.8 1.8    GFR: Estimated Creatinine Clearance: 38 mL/min (by C-G formula based on SCr of 0.52 mg/dL). Liver Function Tests: Recent Labs  Lab 05/13/20 0719 05/14/20 0042  AST 30 27  ALT 30 30  ALKPHOS 69 64  BILITOT 1.1 1.5*  PROT 5.1* 4.6*  ALBUMIN 2.5* 2.2*   No results for input(s): LIPASE, AMYLASE in the last 168 hours. No results for input(s): AMMONIA in the last 168 hours. Coagulation Profile: No results for input(s): INR, PROTIME in the last 168 hours. Cardiac Enzymes: Recent Labs  Lab 05/13/20 0719 05/14/20 0817  CKTOTAL 32* 55   BNP (last 3 results) No results for input(s): PROBNP in the last 8760 hours. HbA1C: Recent Labs    05/15/20 1136  HGBA1C 5.9*   CBG: No results for input(s): GLUCAP in the last 168 hours. Lipid Profile: Recent Labs    05/15/20 0114  CHOL 169  HDL 70  LDLCALC 86  TRIG 63  CHOLHDL 2.4   Thyroid Function Tests: No results for input(s): TSH, T4TOTAL, FREET4, T3FREE, THYROIDAB in the last 72 hours. Anemia Panel: No results for input(s): VITAMINB12, FOLATE, FERRITIN, TIBC, IRON, RETICCTPCT in the last 72 hours. Sepsis Labs: No results for input(s): PROCALCITON, LATICACIDVEN in the last 168 hours.  Recent Results (from the past 240 hour(s))  Urine culture     Status: Abnormal   Collection Time: 05/13/20 10:11 AM   Specimen: Urine, Clean Catch  Result Value Ref Range Status   Specimen Description   Final    URINE, CLEAN CATCH Performed at [facility], [place] [address] [place], [place] [address]    Special Requests   Final    NONE Performed at [facility], [place] [address] [place], [place] [address]    Culture >=100,000 COLONIES/mL ESCHERICHIA COLI (A)  Final   Report Status 05/15/2020 FINAL  Final   Organism ID, Bacteria ESCHERICHIA COLI (A)  Final      Susceptibility   Escherichia coli - MIC*    AMPICILLIN 8 SENSITIVE Sensitive     CEFAZOLIN <=4 SENSITIVE Sensitive     CEFEPIME <=0.12  SENSITIVE Sensitive     CEFTRIAXONE <=0.25 SENSITIVE Sensitive     CIPROFLOXACIN <=0.25 SENSITIVE Sensitive     GENTAMICIN <=1 SENSITIVE Sensitive     IMIPENEM <=0.25 SENSITIVE Sensitive     NITROFURANTOIN <=16 SENSITIVE Sensitive     TRIMETH/SULFA <=20 SENSITIVE Sensitive     AMPICILLIN/SULBACTAM <=2 SENSITIVE Sensitive     PIP/TAZO <=4 SENSITIVE Sensitive     * >=100,000 COLONIES/mL ESCHERICHIA COLI  Resp Panel by RT-PCR (Flu A&B, Covid) Nasopharyngeal Swab     Status: None   Collection Time: 05/13/20 11:31 AM   Specimen: Nasopharyngeal Swab; Nasopharyngeal(NP) swabs in vial transport medium  Result Value Ref Range Status   SARS Coronavirus 2 by RT PCR NEGATIVE NEGATIVE Final    Comment: (NOTE) SARS-CoV-2 target nucleic acids are NOT DETECTED.  The SARS-CoV-2 RNA is generally detectable in upper respiratory specimens during the acute phase of infection. The lowest concentration of SARS-CoV-2 viral copies this assay can detect is 138 copies/mL. A negative result does not preclude SARS-Cov-2 infection and should not be used as the sole basis for treatment or other patient management decisions. A negative result may occur with  improper specimen collection/handling, submission of specimen other than nasopharyngeal swab, presence of viral mutation(s) within the areas targeted by this assay, and inadequate number of viral copies(<138 copies/mL). A negative result must be combined with clinical observations, patient history, and epidemiological information. The expected result is Negative.  Fact Sheet for Patients:  EntrepreneurPulse.com.au  Fact Sheet for Healthcare Providers:  IncredibleEmployment.be  This test is no t yet approved or cleared by the [place] FDA and  has been authorized for detection and/or diagnosis of SARS-CoV-2 by FDA under an Emergency Use Authorization (EUA). This EUA will remain  in effect (meaning this test can be  used) for the duration of the COVID-19 declaration under Section 564(b)(1) of the Act, 21 U.S.C.section 360bbb-3(b)(1), unless the authorization is terminated  or revoked sooner.       Influenza A by PCR NEGATIVE NEGATIVE Final   Influenza B by PCR NEGATIVE NEGATIVE Final    Comment: (NOTE) The Xpert Xpress SARS-CoV-2/FLU/RSV plus assay is intended as an aid in the diagnosis of influenza from Nasopharyngeal swab specimens and should not be used as a sole basis for treatment. Nasal washings and aspirates are unacceptable  for Xpert Xpress SARS-CoV-2/FLU/RSV testing.  Fact Sheet for Patients: EntrepreneurPulse.com.au  Fact Sheet for Healthcare Providers: IncredibleEmployment.be  This test is not yet approved or cleared by the [place] FDA and has been authorized for detection and/or diagnosis of SARS-CoV-2 by FDA under an Emergency Use Authorization (EUA). This EUA will remain in effect (meaning this test can be used) for the duration of the COVID-19 declaration under Section 564(b)(1) of the Act, 21 U.S.C. section 360bbb-3(b)(1), unless the authorization is terminated or revoked.  Performed at [facility], [place] [address], [place], [place] [address]   MRSA PCR Screening     Status: None   Collection Time: 05/13/20 11:08 PM   Specimen: Nasopharyngeal  Result Value Ref Range Status   MRSA by PCR NEGATIVE NEGATIVE Final    Comment:        The GeneXpert MRSA Assay (FDA approved for NASAL specimens only), is one component of a comprehensive MRSA colonization surveillance program. It is not intended to diagnose MRSA infection nor to guide or monitor treatment for MRSA infections. Performed at [facility], [place] [address] [place],  [address]          Radiology Studies: CT Angio Chest/Abd/Pel for Dissection W and/or W/WO  Result Date: 05/16/2020 CLINICAL DATA:  Abdominal and back pain, possible  aortic dissection EXAM: CT ANGIOGRAPHY CHEST, ABDOMEN AND PELVIS TECHNIQUE: Multidetector CT imaging through the chest, abdomen and pelvis was performed using the standard protocol during bolus administration of intravenous contrast. Multiplanar reconstructed images and MIPs were obtained and reviewed to evaluate the vascular anatomy. CONTRAST:  131mL OMNIPAQUE IOHEXOL 350 MG/ML SOLN COMPARISON:  05/13/2020 and previous FINDINGS: CTA CHEST FINDINGS Cardiovascular: mild cardiomegaly. Small volume pericardial fluid. Good contrast opacification of the pulmonary arterial tree. Partially occlusive embolus, right upper lobe pulmonary artery extending into segmental branches. Subsegmental embolus, superior segment right lower lobe pulmonary artery branch. No definite contralateral emboli. There is good contrast opacification of the thoracic aorta. No dissection or stenosis. Eccentric nonocclusive mural thrombus in the isthmus and proximal descending segment, stable. Aortic Root: --Valve: 2.4 cm --Sinuses: 3.7 cm --Sinotubular Junction: 3.2 cm Limitations by motion: Mild Thoracic Aorta: --Ascending Aorta: 3.6 cm --Aortic Arch: 3.5 cm --Descending Aorta: 3.6 cm Mediastinum/Nodes: Moderate hiatal hernia.  No mass or adenopathy. Lungs/Pleura: Small pleural effusions, left greater than right. No pneumothorax. Dependent subsegmental atelectasis in the right lower lobe. More extensive dependent and basilar atelectasis, left lower lobe. Musculoskeletal: Anterior vertebral endplate spurring at multiple levels in the mid and lower thoracic spine. Bilateral shoulder DJD. No fracture or worrisome bone lesion. Review of the MIP images confirms the above findings. CTA ABDOMEN AND PELVIS FINDINGS VASCULAR Aorta: Mild tortuosity. Moderate calcified atheromatous plaque. No aneurysm, dissection, or stenosis. Celiac: Patent without evidence of aneurysm, dissection, vasculitis or significant stenosis. SMA: Partially calcified ostial plaque  without significant stenosis, patent distally with classic branch anatomy. Renals: Single left with partially calcified plaque in its mid and distal segment, no high-grade stenosis. Duplicated right, superior dominant with partially calcified plaque just beyond the ostium resulting in short segment stenosis of at least mild severity, patent distally. IMA: Patent without evidence of aneurysm, dissection, vasculitis or significant stenosis. Inflow: Mild tortuosity. Scattered calcified plaque. No aneurysm, dissection, or stenosis. Veins: No obvious venous abnormality within the limitations of this arterial phase study. Review of the MIP images confirms the above findings. NON-VASCULAR Hepatobiliary: No liver lesion or biliary ductal dilatation. 1.8 cm partially calcified stone in the  nondistended gallbladder. Pancreas: 1.1 cm well-demarcated cystic lesion in the midbody. No ductal dilatation. Spleen: Lobular contour, without enlargement or focal lesion. Adrenals/Urinary Tract: Bilateral adrenal hypertrophy. 1.2 cm partially exophytic right midpole low-attenuation lesion, nonspecific. No hydronephrosis. Urinary bladder physiologically distended. Stomach/Bowel: Moderate hiatal hernia. Stomach is nondistended. Small bowel decompressed. Appendix not discretely identified. Colon nondilated. Scattered descending colonic diverticula without adjacent inflammatory change. Lymphatic: No abdominal or pelvic adenopathy. Reproductive: Uterus and bilateral adnexa are unremarkable. Other: No ascites.  Bilateral pelvic phleboliths.  No free air. Musculoskeletal: Small paraumbilical hernia containing only mesenteric fat. Lumbar scoliosis, multilevel lumbar spondylitic change with grade 1 anterolisthesis L4-5 and L5-S1 probably related to facet DJD; no pars defect. Sclerosis in bilateral femoral heads without subchondral collapse. No fracture or worrisome bone lesion. Review of the MIP images confirms the above findings. IMPRESSION: 1.  Right pulmonary emboli, new since 05/13/2020. No CT evidence of right heart strain or pulmonary infarct. 2. Stable eccentric nonocclusive mural thrombus in the aortic isthmus and proximal descending thoracic segment. No dissection. 3. Small bilateral pleural effusions, left greater than right. 4. Lumbar scoliosis and multilevel lumbar spondylitic change. 5. Cholelithiasis. 6. Moderate hiatal hernia. 7. Descending colonic diverticulosis. 8. 1.1 cm well-demarcated cystic lesion in the midbody of the pancreas. Recommend follow-up pancreatic-protocol CT or MRI in 2 years. Electronically Signed   By: Lucrezia Europe M.D.   On: 05/16/2020 14:34        Scheduled Meds: . acetaminophen  650 mg Oral Q6H  . anastrozole  1 mg Oral Daily  . apixaban  5 mg Oral BID  . atorvastatin  20 mg Oral Daily  . carvedilol  3.125 mg Oral BID WC  . cephALEXin  500 mg Oral Q8H  . cholestyramine  1 packet Oral BID  . docusate sodium  100 mg Oral BID  . lidocaine  1 patch Transdermal Q24H  . lisinopril  5 mg Oral Daily  . predniSONE  2.5 mg Oral Daily  . sertraline  50 mg Oral Daily   Continuous Infusions:    LOS: 4 days   Time spent= 35 mins    Charidy Cappelletti Arsenio Loader, MD [facility]  If 7PM-7AM, please contact night-coverage  05/17/2020, 11:17 AM

## 2020-05-17 NOTE — Progress Notes (Signed)
Lower extremity venous has been completed.   Preliminary results in CV Proc.   Abram Sander 05/17/2020 2:18 PM

## 2020-05-18 LAB — CBC
HCT: 34 % — ABNORMAL LOW (ref 36.0–46.0)
Hemoglobin: 11.7 g/dL — ABNORMAL LOW (ref 12.0–15.0)
MCH: 33.4 pg (ref 26.0–34.0)
MCHC: 34.4 g/dL (ref 30.0–36.0)
MCV: 97.1 fL (ref 80.0–100.0)
Platelets: 251 10*3/uL (ref 150–400)
RBC: 3.5 MIL/uL — ABNORMAL LOW (ref 3.87–5.11)
RDW: 13.4 % (ref 11.5–15.5)
WBC: 9.5 10*3/uL (ref 4.0–10.5)
nRBC: 0 % (ref 0.0–0.2)

## 2020-05-18 LAB — BASIC METABOLIC PANEL
Anion gap: 4 — ABNORMAL LOW (ref 5–15)
BUN: 14 mg/dL (ref 8–23)
CO2: 24 mmol/L (ref 22–32)
Calcium: 7.8 mg/dL — ABNORMAL LOW (ref 8.9–10.3)
Chloride: 106 mmol/L (ref 98–111)
Creatinine, Ser: 0.59 mg/dL (ref 0.44–1.00)
GFR, Estimated: >60 mL/min (ref >60)
Glucose, Bld: 112 mg/dL — ABNORMAL HIGH (ref 70–99)
Potassium: 3.6 mmol/L (ref 3.5–5.1)
Sodium: 134 mmol/L — ABNORMAL LOW (ref 135–145)

## 2020-05-18 LAB — MAGNESIUM: Magnesium: 1.9 mg/dL (ref 1.7–2.4)

## 2020-05-18 MED ORDER — CARVEDILOL 3.125 MG PO TABS: 3.1250 mg | ORAL_TABLET | Freq: Two times a day (BID) | ORAL | Status: DC

## 2020-05-18 MED ORDER — PANTOPRAZOLE SODIUM 40 MG PO TBEC
40.0000 mg | DELAYED_RELEASE_TABLET | Freq: Every day | ORAL | Status: DC
  Administered 2020-05-19: 40 mg via ORAL
  Filled 2020-05-18: qty 1

## 2020-05-18 MED ORDER — ATORVASTATIN CALCIUM 20 MG PO TABS: 20.0000 mg | ORAL_TABLET | Freq: Every day | ORAL | Status: DC

## 2020-05-18 MED ORDER — LISINOPRIL 5 MG PO TABS: 5.0000 mg | ORAL_TABLET | Freq: Every day | ORAL | Status: DC

## 2020-05-18 MED ORDER — PANTOPRAZOLE SODIUM 40 MG PO TBEC: 40.0000 mg | DELAYED_RELEASE_TABLET | Freq: Every day | ORAL | Status: DC

## 2020-05-18 MED ORDER — ASPIRIN 81 MG PO TBEC: 81.0000 mg | DELAYED_RELEASE_TABLET | Freq: Every day | ORAL | 11 refills | Status: DC

## 2020-05-18 MED ORDER — POTASSIUM CHLORIDE 20 MEQ PO PACK
40.0000 meq | PACK | Freq: Once | ORAL | Status: AC
  Administered 2020-05-18: 40 meq via ORAL
  Filled 2020-05-18: qty 2

## 2020-05-18 MED ORDER — ASPIRIN EC 81 MG PO TBEC
81.0000 mg | DELAYED_RELEASE_TABLET | Freq: Every day | ORAL | Status: DC
  Administered 2020-05-18 – 2020-05-19 (×2): 81 mg via ORAL
  Filled 2020-05-18 (×2): qty 1

## 2020-05-18 MED ORDER — APIXABAN 5 MG PO TABS: 5.0000 mg | ORAL_TABLET | Freq: Two times a day (BID) | ORAL | Status: DC

## 2020-05-18 NOTE — Progress Notes (Signed)
Physical Therapy Treatment Patient Details Name: Becky [name] MRN: [id number] DOB: 06-29-1929 Today's Date: 05/18/2020    History of Present Illness Becky [name] is a 87 y.o. female admitted 3/17 from [facility] with fall. Pt with left 7-11 rib fx. Pt found to have aortic interval hematoma and hypokalemia. 3/20 fround to have PE. PMhx: dementia, anxiety/depression, fibromyalgia, glaucoma, breast CA, polio, scoliosis, COPD    PT Comments    Pt pleasant but confused regarding situation and time with incontinent stool and total assist for pericare and direction for use of BSC for further voiding x 2 during session. Pt with HR 105-144 with activity with HR 105-115 seated and significantly increased to 130 with standing and 144 max with very limited gait. Pt on RA on arrival at 94% with [place] on forehead end of session pt with sats 90% on RA and returned to 2L with sats 95%. Pt with limited progression of activity limited by HR and fatigue. No pain reported this session.  Will continue to follow.     Follow Up Recommendations  SNF;Supervision/Assistance - 24 hour     Equipment Recommendations  None recommended by PT    Recommendations for Other Services       Precautions / Restrictions Precautions Precautions: Fall Precaution Comments: left rib fx, watch HR    Mobility  Bed Mobility Overal bed mobility: Needs Assistance Bed Mobility: Supine to Sit     Supine to sit: Min assist     General bed mobility comments: min assist to pivot to right side of bed and elevate trunk with increased time. Pt with noted incontinent stool on arrival with pt unaware    Transfers Overall transfer level: Needs assistance   Transfers: Sit to/from Stand;Stand Pivot Transfers Sit to Stand: Min assist Stand pivot transfers: Min assist       General transfer comment: min assist to rise from bed pivot to [facility] then cues for hand placement to stand from [facility] with use of rails (minguard). Minguard for  standing with RW for pericare. Pt with additional pivot recliner<>BSC with use of RW with min assist for pivot and guarding for riseing with use of rails/armrests. Total assist for pericare in standing x 2  Ambulation/Gait Ambulation/Gait assistance: Min assist Gait Distance (Feet): 14 Feet Assistive device: Rolling walker (2 wheeled) Gait Pattern/deviations: Step-through pattern;Decreased stride length;Trunk flexed   Gait velocity interpretation: <1.8 ft/sec, indicate of risk for recurrent falls General Gait Details: assist to direct RW with guarding for stability and cues for direction. HR up to 144 with limited gait   Stairs             Wheelchair Mobility    Modified Rankin (Stroke Patients Only)       Balance Overall balance assessment: History of Falls;Needs assistance   Sitting balance-Leahy Scale: Fair Sitting balance - Comments: EOB and BSC without UE support   Standing balance support: Bilateral upper extremity supported Standing balance-Leahy Scale: Poor Standing balance comment: RW for standing pericare and gait                            Cognition Arousal/Alertness: Awake/alert Behavior During Therapy: WFL for tasks assessed/performed Overall Cognitive Status: History of cognitive impairments - at baseline                                 General Comments: not  oriented to time aware of person/place. Incontinent on arrival and unaware      Exercises General Exercises - Lower Extremity Long Arc Quad: AROM;Both;Seated;15 reps Hip Flexion/Marching: AROM;Both;10 reps;Seated    General Comments        Pertinent Vitals/Pain Pain Assessment: No/denies pain    Home Living                      Prior Function            PT Goals (current goals can now be found in the care plan section) Progress towards PT goals: Progressing toward goals    Frequency    Min 2X/week      PT Plan Current plan remains  appropriate    Co-evaluation              AM-PAC PT "6 Clicks" Mobility   Outcome Measure  Help needed turning from your back to your side while in a flat bed without using bedrails?: A Little Help needed moving from lying on your back to sitting on the side of a flat bed without using bedrails?: A Little Help needed moving to and from a bed to a chair (including a wheelchair)?: A Little Help needed standing up from a chair using your arms (e.g., wheelchair or bedside chair)?: A Little Help needed to walk in hospital room?: A Little Help needed climbing 3-5 steps with a railing? : Total 6 Click Score: 16    End of Session   Activity Tolerance: Patient tolerated treatment well Patient left: in chair;with call bell/phone within reach;with chair alarm set Nurse Communication: Mobility status PT Visit Diagnosis: Other abnormalities of gait and mobility (R26.89);Difficulty in walking, not elsewhere classified (R26.2)     Time: 7106-2694 PT Time Calculation (min) (ACUTE ONLY): 27 min  Charges:  $Therapeutic Exercise: 8-22 mins $Therapeutic Activity: 8-22 mins                     Ahmet Schank P, PT Acute Rehabilitation Services Pager: [phone] Office: [phone]    Becky [name] 05/18/2020, 9:02 AM

## 2020-05-18 NOTE — TOC Progression Note (Signed)
Transition of Care [facility]) - Progression Note    Patient Details  Name: Becky [name] MRN: [id number] Date of Birth: 1929-08-30  Transition of Care [facility] CM/SW [place], [place] Phone Number: 05/18/2020, 11:50 AM  Clinical Narrative:    Josem Kaufmann request started, ref number [id number], clinicals faxed over.    Expected Discharge Plan: Skilled Nursing Facility Barriers to Discharge: Continued Medical Work up  Expected Discharge Plan and Services Expected Discharge Plan: [place]         Expected Discharge Date: 05/17/20                                     Social Determinants of Health (SDOH) Interventions    Readmission Risk Interventions No flowsheet data found.

## 2020-05-18 NOTE — TOC Benefit Eligibility Note (Signed)
Patient Teacher, English as a foreign language completed.    The patient is currently admitted and upon discharge could be taking Eliquis 5 mg.  The current 30 day co-pay is, $0.00.   The patient is insured through [employer] Patient Advocate Specialist [place] Team Direct Number: [phone]  Fax: [phone]

## 2020-05-18 NOTE — Discharge Summary (Signed)
Physician Discharge Summary  Becky [name] QVZ:[id number] DOB: September 05, 1929 DOA: 05/13/2020  PCP: System, Provider Not In  Admit date: 05/13/2020 Discharge date: 05/18/2020  Admitted From: Home Disposition: SNF  Recommendations for Outpatient Follow-up:  1. Follow up with PCP in 1-2 weeks 2. Please obtain BMP/CBC in one week your next doctors visit.  3. Eliquis twice daily started 4. Pain medication with bowel regimen prescribed 5. Aspirin, Coreg, statin, lisinopril prescribed. 6. Eliquis twice daily 7. PPI daily.   Discharge Condition: Stable CODE STATUS: DNR Diet recommendation: Heart healthy  Brief/Interim Summary: 87 year old with history of significant dementia, anxiety, depression admitted from SNF due to unwitnessed fall.  Unable to provide any history but there is concerned that she had hit her head and falling on her left side.  Trauma work-up showed multiple rib fractures, hypokalemia and intramural aortic thrombus.  Vascular and trauma team were consulted.  CT of the head and neck were negative.  She was also found to have UTI started on Rocephin. Repeat CT showed incidental new PE started on [facility].   PT recommended SNF therefore arrangements made with the help of TOC team.  Patient's daughter is at the bedside during my evaluation and all the instructions have been given by me to the patient and her daughter.  All the questions answered.  Assessment & Plan:   Principal Problem:   Rib fracture Active Problems:   Osteoporosis   MDD (major depressive disorder)   Malignant neoplasm of upper-outer quadrant of right breast in female, estrogen receptor positive ([place])   Fall   Intramural hematoma of thoracic aorta (HCC)   Pressure injury of skin   Chronic systolic CHF (congestive heart failure) ([place])   Pulmonary embolism (HCC)  Unwitnessed fall Left-sided rib fractures 7-11, closed -Plan will be for pain control, bowel regimen. -Seen by trauma service, signed off.  CT  head and neck is negative.  No surgical intervention at this time.  Incentive spirometer -Supportive care.  PT/OT = SNF, TOC team to make arrangements for this. -Vitamin D-normal  Aortic arch proximal descending aortic intramural hematoma -Seen by vascular surgery, recommending blood pressure and pain control at this time.  Repeat CT abdomen pelvis per their service-per Vascular.  This appears to be stable for vascular therefore no further work-up needed  Right Sided PE, without cor pulmonale  Okay with vascular for anticoagulation.  Initially on heparin drip transition to oral Eliquis.  Lower extremity Dopplers negative for DVT.  Will need anticoagulation minimum of 3 to 6 months.  Follow-up outpatient with PCP and or oncology.  Acute congestive heart failure with reduced ejection fraction, EF 30-35% -Global hypokinesia.  Mild to moderate aortic valve -Patient is on Anastrozole, spoke with Dr Jana Hakim. She has not received any chemo that could the culprilt.  Spoke with Dr. Lovena Le from cardiology, recommend medical management starting patient on routine cardiac medication.  Started patient on Coreg, lisinopril and statin.  Once hematoma stable, can also add aspirin.  Their service will arrange for outpatient follow-up. -A1c 5.9, LDL 86 Because patient will be on aspirin and Eliquis, I have added PPI daily.  Hypokalemia -Repleted  Urinary tract infection -Transition to p.o. Keflex.  Last day 3/22.  Ucx = E. coli, pansensitive.  Last gave antibiotics today  Anxiety/depression Dementia -Supportive care.  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  On anastrozole.  Follows with outpatient Dr. Jana Hakim   PT/OT-SNF  Body mass index is 18.53 kg/m.  Pressure Injury 05/13/20 Buttocks Left Stage 1 -  Intact skin with non-blanchable redness of a localized area usually over a bony prominence. Intact red and non blanchable area from prior  Purewick malposition (Active)  05/13/20 2310  Location: Buttocks  Location Orientation: Left  Staging: Stage 1 -  Intact skin with non-blanchable redness of a localized area usually over a bony prominence.  Wound Description (Comments): Intact red and non blanchable area from prior Purewick malposition  Present on Admission: Yes        Discharge Diagnoses:  Principal Problem:   Rib fracture Active Problems:   Osteoporosis   MDD (major depressive disorder)   Malignant neoplasm of upper-outer quadrant of right breast in female, estrogen receptor positive ([place])   Fall   Intramural hematoma of thoracic aorta (HCC)   Pressure injury of skin   Chronic systolic CHF (congestive heart failure) ([place])   Pulmonary embolism ([place])      Consultations:  Vascular  Subjective: Feels great no complaints.  Daughter is at the bedside as well.  Discharge Exam: Vitals:   05/18/20 0831 05/18/20 0931  BP: (!) 85/60   Pulse: (!) 105 95  Resp: 19 20  Temp:    SpO2: 93% 92%   Vitals:   05/18/20 0735 05/18/20 0829 05/18/20 0831 05/18/20 0931  BP:  (!) 141/61 (!) 85/60   Pulse: (!) 108 98 (!) 105 95  Resp:  19 19 20   Temp:  97.7 F (36.5 C)    TempSrc:  Oral    SpO2:  94% 93% 92%  Weight:      Height:        General: Pt is alert, awake, not in acute distress, elderly frail Cardiovascular: RRR, S1/S2 +, no rubs, no gallops Respiratory: CTA bilaterally, no wheezing, no rhonchi Abdominal: Soft, NT, ND, bowel sounds + Extremities: no edema, no cyanosis  Discharge Instructions   Allergies as of 05/18/2020      Reactions   Brimonidine    Unknown - Listed on MAR      Medication List    TAKE these medications   acetaminophen 500 MG tablet Commonly known as: TYLENOL Take 500 mg by mouth every 6 (six) hours as needed (for pain).   anastrozole 1 MG tablet Commonly known as: ARIMIDEX Take 1 tablet (1 mg total) by mouth daily.   apixaban 5 MG Tabs tablet Commonly known as:  ELIQUIS Take 1 tablet (5 mg total) by mouth 2 (two) times daily.   aspirin 81 MG EC tablet Take 1 tablet (81 mg total) by mouth daily. Swallow whole. Start taking on: May 19, 2020   atorvastatin 20 MG tablet Commonly known as: LIPITOR Take 1 tablet (20 mg total) by mouth daily. Start taking on: May 19, 2020   carvedilol 3.125 MG tablet Commonly known as: COREG Take 1 tablet (3.125 mg total) by mouth 2 (two) times daily with a meal.   cholestyramine 4 g packet Commonly known as: QUESTRAN Take 1 packet by mouth in the morning and at bedtime. Mix with 8-oz of fluid   cyanocobalamin 1000 MCG/ML injection Commonly known as: (VITAMIN B-12) Inject 1,000 mcg into the muscle every 30 (thirty) days. On the 21st of each month   HYDROcodone-acetaminophen 5-325 MG tablet Commonly known as: NORCO/VICODIN Take 1-2 tablets by mouth every 4 (four) hours as needed for moderate pain.   lisinopril 5 MG tablet Commonly known as: ZESTRIL Take 1 tablet (5 mg total) by mouth daily. Start taking on: May 19, 2020   loperamide 2 MG tablet Commonly known as: IMODIUM A-D Take 4 mg by mouth daily as needed for diarrhea or loose stools.   multivitamin with minerals Tabs tablet Take 1 tablet by mouth daily.   pantoprazole 40 MG tablet Commonly known as: PROTONIX Take 1 tablet (40 mg total) by mouth daily before breakfast. Start taking on: May 19, 2020   predniSONE 2.5 MG tablet Commonly known as: DELTASONE Take 2.5 mg by mouth daily.   RA NUTRITIONAL SUPPORT PO Take 120 mLs by mouth in the morning and at bedtime. Medpass   REFRESH OP Place 1 drop into both eyes in the morning, at noon, in the evening, and at bedtime.   sertraline 50 MG tablet Commonly known as: ZOLOFT Take 50 mg by mouth daily.   sodium chloride 5 % ophthalmic ointment Commonly known as: MURO 154 Place 1 application into the left eye in the morning and at bedtime.   traMADol 50 MG tablet Commonly known as:  ULTRAM Take 1 tablet (50 mg total) by mouth every 6 (six) hours as needed for moderate pain.   Travoprost (BAK Free) 0.004 % Soln ophthalmic solution Commonly known as: TRAVATAN Place 1 drop into the right eye at bedtime.   Vitamin D3 125 MCG (5000 UT) Tabs Take 10,000 Units by mouth every Tuesday.       Allergies  Allergen Reactions  . Brimonidine     Unknown - Listed on MAR    You were cared for by a hospitalist during your hospital stay. If you have any questions about your discharge medications or the care you received while you were in the hospital after you are discharged, you can call the unit and asked to speak with the hospitalist on call if the hospitalist that took care of you is not available. Once you are discharged, your primary care physician will handle any further medical issues. Please note that no refills for any discharge medications will be authorized once you are discharged, as it is imperative that you return to your primary care physician (or establish a relationship with a primary care physician if you do not have one) for your aftercare needs so that they can reassess your need for medications and monitor your lab values.   Procedures/Studies: DG Chest 2 View  Result Date: 05/13/2020 CLINICAL DATA:  [place]. EXAM: CHEST - 2 VIEW COMPARISON:  04/20/2019 FINDINGS: Left base atelectasis or infiltrate noted with probable small left pleural effusion. Skin fold overlies the lower left hemithorax. Right lung clear. Cardiopericardial silhouette is at upper limits of normal for size. Degenerative changes noted in both shoulders. Telemetry leads overlie the chest. IMPRESSION: Left base atelectasis or infiltrate with probable small left pleural effusion. Electronically Signed   By: Misty Stanley M.D.   On: 05/13/2020 08:06   DG Ribs Unilateral Left  Result Date: 05/13/2020 CLINICAL DATA:  Unwitnessed fall left-sided pain EXAM: LEFT RIBS - 2 VIEW COMPARISON:  Chest x-ray from  earlier today FINDINGS: Left lateral seventh, eighth, ninth, tenth, and eleventh rib fractures. The ninth rib fracture is displaced. Atelectasis without pneumothorax or large pleural effusion. Thoracolumbar spine degeneration with scoliosis. IMPRESSION: Left seventh through eleventh rib fractures with up to mild displacement. Electronically Signed   By: Monte Fantasia M.D.   On: 05/13/2020 10:18   CT Head Wo Contrast  Result Date: 05/13/2020 CLINICAL DATA:  Unwitnessed fall EXAM: CT HEAD WITHOUT CONTRAST TECHNIQUE: Contiguous axial images were obtained from the base of the  skull through the vertex without intravenous contrast. COMPARISON:  08/08/2016 FINDINGS: Brain: There is atrophy and chronic small vessel disease changes. No acute intracranial abnormality. Specifically, no hemorrhage, hydrocephalus, mass lesion, acute infarction, or significant intracranial injury. Vascular: No hyperdense vessel or unexpected calcification. Skull: No acute calvarial abnormality. Sinuses/Orbits: No acute findings Other: None IMPRESSION: Atrophy, chronic microvascular disease. No acute intracranial abnormality. Electronically Signed   By: Rolm Baptise M.D.   On: 05/13/2020 08:43   CT Chest W Contrast  Result Date: 05/13/2020 CLINICAL DATA:  Pain following fall EXAM: CT CHEST WITH CONTRAST TECHNIQUE: Multidetector CT imaging of the chest was performed during intravenous contrast administration. CONTRAST:  76mL OMNIPAQUE IOHEXOL 300 MG/ML  SOLN COMPARISON:  Chest CT April 20, 2019; rib radiographs May 13, 2020 FINDINGS: Cardiovascular: No thoracic aortic aneurysm or dissection is evident. Foci of calcification noted in visualized great vessels. There is aortic atherosclerosis. There is apparent intramural hematoma in the posterior aortic extending from the arch to the midportion causing approximately 50% diameter narrowing. No ulceration in this area. No mediastinal hematoma evident. No plaque ulceration is appreciable  by CT. There are foci of coronary artery calcification. There is no pericardial thickening. A fairly small amount of pericardial fluid is noted superiorly which is somewhat similar to prior study and may be within physiologic range. No major vessel pulmonary embolus. Mediastinum/Nodes: Thyroid appears unremarkable. No appreciable thoracic adenopathy. There is a paraesophageal hernia extending to the left, also present previously. Lungs/Pleura: There is atelectatic change in each lung base, more severe on the left than on the right with small amount of pleural fluid on the left. Mild consolidation also noted in the left base. No appreciable pneumothorax on the left. No pulmonary edema appreciable. Trachea and major bronchial structures appear patent. Upper Abdomen: There is hepatic steatosis. There is upper abdominal aortic atherosclerosis. Visualized liver and spleen appear intact. Musculoskeletal: There are fractures of the lateral left fifth, lateral seventh, lateral eighth, lateral ninth, and lateral tenth ribs on the left with displacement of lateral left ninth and tenth ribs. Questionable nondisplaced fracture lateral left sixth rib. No blastic or lytic bone lesions are evident. There is degenerative change in the thoracic spine. No appreciable chest wall lesions. Previously noted mass in the right breast no longer evident. IMPRESSION: 1. Multiple rib fractures on the left. Small left pleural effusion but no pneumothorax. 2. Bibasilar atelectasis, more severe on the left than the right with questionable developing infiltrate left base. 3. Apparent intramural hematoma in the aorta posteriorly involving the arch and proximal to mid descending aorta. No aneurysm or dissection. No mediastinal hematoma. There is aortic atherosclerosis. 4.  No adenopathy. 5.  Paraesophageal hernia again noted. 6.  Hepatic steatosis. 7. Fairly small amount of pericardial fluid superiorly, stable compared to the February 2021 study.  Aortic Atherosclerosis (ICD10-I70.0). Electronically Signed   By: Lowella Grip III M.D.   On: 05/13/2020 12:09   CT Cervical Spine Wo Contrast  Result Date: 05/13/2020 CLINICAL DATA:  Unwitnessed fall. EXAM: CT CERVICAL SPINE WITHOUT CONTRAST TECHNIQUE: Multidetector CT imaging of the cervical spine was performed without intravenous contrast. Multiplanar CT image reconstructions were also generated. COMPARISON:  08/08/2016 FINDINGS: Alignment: Slight anterolisthesis of C4 on C5 related to facet disease. No subluxation. Skull base and vertebrae: No acute fracture. No primary bone lesion or focal pathologic process. Soft tissues and spinal canal: No prevertebral fluid or swelling. No visible canal hematoma. Disc levels: Diffuse advanced degenerative disc disease and facet disease. Upper chest:  No acute findings Other: None IMPRESSION: Advanced cervical spondylosis. No acute bony abnormality. Electronically Signed   By: Rolm Baptise M.D.   On: 05/13/2020 08:45   DG CHEST PORT 1 VIEW  Result Date: 05/14/2020 CLINICAL DATA:  87 year old female with chest pain. Fall with minimally displaced left lateral rib fractures. EXAM: PORTABLE CHEST 1 VIEW COMPARISON:  Chest CT yesterday. Chest radiographs 05/13/2020 and earlier. FINDINGS: Portable AP upright view at 0606 hours. Left lateral lower rib fractures as demonstrated by CT. No pneumothorax. Stable left lung atelectasis. No significant pleural effusion is evident. Stable cardiac size and mediastinal contours. Right lung base atelectasis appears regressed. Stable right chest wall surgical clips. Negative visible bowel gas pattern. Stable visualized osseous structures. IMPRESSION: Left lower rib fractures with stable left lung base atelectasis. No pneumothorax. Electronically Signed   By: Genevie Ann M.D.   On: 05/14/2020 08:31   ECHOCARDIOGRAM COMPLETE  Result Date: 05/14/2020    ECHOCARDIOGRAM REPORT   Patient Name:   CHARDAI GANGEMI Langenbach Date of Exam: 05/14/2020  Medical Rec #:  [id number]      Height:       65.0 in Accession #:    [id number]     Weight:       111.3 lb Date of Birth:  05-Oct-1929      BSA:          1.542 m Patient Age:    3 years       BP:           108/65 mmHg Patient Gender: F              HR:           83 bpm. Exam Location:  Inpatient Procedure: 2D Echo Indications:    syncope  History:        Patient has no prior history of Echocardiogram examinations.                 COPD.  Sonographer:    Johny Chess Referring Phys: [id number] Jonnie Finner  Sonographer Comments: Left rib fractures. IMPRESSIONS  1. Left ventricular ejection fraction, by estimation, is 30 to 35%. The left ventricle has moderately decreased function. The left ventricle demonstrates global hypokinesis. Left ventricular diastolic parameters are indeterminate.  2. Right ventricular systolic function is normal. The right ventricular size is normal.  3. The mitral valve is normal in structure. No evidence of mitral valve regurgitation. No evidence of mitral stenosis.  4. The aortic valve has an indeterminant number of cusps. Aortic valve regurgitation is mild. Mild to moderate aortic valve sclerosis/calcification is present, without any evidence of aortic stenosis.  5. The inferior vena cava is normal in size with greater than 50% respiratory variability, suggesting right atrial pressure of 3 mmHg. FINDINGS  Left Ventricle: Left ventricular ejection fraction, by estimation, is 30 to 35%. The left ventricle has moderately decreased function. The left ventricle demonstrates global hypokinesis. The left ventricular internal cavity size was normal in size. There is no left ventricular hypertrophy. Abnormal (paradoxical) septal motion, consistent with left bundle branch block. Left ventricular diastolic parameters are indeterminate. Right Ventricle: The right ventricular size is normal. N Right ventricular systolic function is normal. Left Atrium: Left atrial size was normal in size. Right  Atrium: Right atrial size was normal in size. Pericardium: There is no evidence of pericardial effusion. Mitral Valve: The mitral valve is normal in structure. Mild mitral annular calcification. No evidence of mitral valve regurgitation. No evidence  of mitral valve stenosis. Tricuspid Valve: The tricuspid valve is normal in structure. Tricuspid valve regurgitation is trivial. No evidence of tricuspid stenosis. Aortic Valve: The aortic valve has an indeterminant number of cusps. Aortic valve regurgitation is mild. Aortic regurgitation PHT measures 251 msec. Mild to moderate aortic valve sclerosis/calcification is present, without any evidence of aortic stenosis. Pulmonic Valve: The pulmonic valve was not well visualized. Pulmonic valve regurgitation is not visualized. No evidence of pulmonic stenosis. Aorta: The aortic root is normal in size and structure. Venous: The inferior vena cava is normal in size with greater than 50% respiratory variability, suggesting right atrial pressure of 3 mmHg.  LEFT VENTRICLE PLAX 2D LVIDd:         4.80 cm     Diastology LVIDs:         4.00 cm     LV e' lateral: 5.00 cm/s LV PW:         0.80 cm LV IVS:        0.70 cm LVOT diam:     1.80 cm LV SV:         29 LV SV Index:   19 LVOT Area:     2.54 cm  LV Volumes (MOD) LV vol d, MOD A2C: 68.3 ml LV vol d, MOD A4C: 59.9 ml LV vol s, MOD A2C: 42.4 ml LV vol s, MOD A4C: 46.6 ml LV SV MOD A2C:     25.9 ml LV SV MOD A4C:     59.9 ml LV SV MOD BP:      20.6 ml RIGHT VENTRICLE             IVC RV S prime:     10.70 cm/s  IVC diam: 1.30 cm LEFT ATRIUM           Index LA Vol (A2C): 30.2 ml 19.58 ml/m LA Vol (A4C): 31.2 ml 20.23 ml/m  AORTIC VALVE LVOT Vmax:   69.90 cm/s LVOT Vmean:  42.800 cm/s LVOT VTI:    0.115 m AI PHT:      251 msec  AORTA Ao Root diam: 3.50 cm Ao Asc diam:  3.30 cm  SHUNTS Systemic VTI:  0.12 m Systemic Diam: 1.80 cm Kirk Ruths MD Electronically signed by Kirk Ruths MD Signature Date/Time: 05/14/2020/5:15:49 PM     Final    CT Angio Chest/Abd/Pel for Dissection W and/or W/WO  Result Date: 05/16/2020 CLINICAL DATA:  Abdominal and back pain, possible aortic dissection EXAM: CT ANGIOGRAPHY CHEST, ABDOMEN AND PELVIS TECHNIQUE: Multidetector CT imaging through the chest, abdomen and pelvis was performed using the standard protocol during bolus administration of intravenous contrast. Multiplanar reconstructed images and MIPs were obtained and reviewed to evaluate the vascular anatomy. CONTRAST:  120mL OMNIPAQUE IOHEXOL 350 MG/ML SOLN COMPARISON:  05/13/2020 and previous FINDINGS: CTA CHEST FINDINGS Cardiovascular: mild cardiomegaly. Small volume pericardial fluid. Good contrast opacification of the pulmonary arterial tree. Partially occlusive embolus, right upper lobe pulmonary artery extending into segmental branches. Subsegmental embolus, superior segment right lower lobe pulmonary artery branch. No definite contralateral emboli. There is good contrast opacification of the thoracic aorta. No dissection or stenosis. Eccentric nonocclusive mural thrombus in the isthmus and proximal descending segment, stable. Aortic Root: --Valve: 2.4 cm --Sinuses: 3.7 cm --Sinotubular Junction: 3.2 cm Limitations by motion: Mild Thoracic Aorta: --Ascending Aorta: 3.6 cm --Aortic Arch: 3.5 cm --Descending Aorta: 3.6 cm Mediastinum/Nodes: Moderate hiatal hernia.  No mass or adenopathy. Lungs/Pleura: Small pleural effusions, left greater than right. No pneumothorax. Dependent  subsegmental atelectasis in the right lower lobe. More extensive dependent and basilar atelectasis, left lower lobe. Musculoskeletal: Anterior vertebral endplate spurring at multiple levels in the mid and lower thoracic spine. Bilateral shoulder DJD. No fracture or worrisome bone lesion. Review of the MIP images confirms the above findings. CTA ABDOMEN AND PELVIS FINDINGS VASCULAR Aorta: Mild tortuosity. Moderate calcified atheromatous plaque. No aneurysm, dissection, or  stenosis. Celiac: Patent without evidence of aneurysm, dissection, vasculitis or significant stenosis. SMA: Partially calcified ostial plaque without significant stenosis, patent distally with classic branch anatomy. Renals: Single left with partially calcified plaque in its mid and distal segment, no high-grade stenosis. Duplicated right, superior dominant with partially calcified plaque just beyond the ostium resulting in short segment stenosis of at least mild severity, patent distally. IMA: Patent without evidence of aneurysm, dissection, vasculitis or significant stenosis. Inflow: Mild tortuosity. Scattered calcified plaque. No aneurysm, dissection, or stenosis. Veins: No obvious venous abnormality within the limitations of this arterial phase study. Review of the MIP images confirms the above findings. NON-VASCULAR Hepatobiliary: No liver lesion or biliary ductal dilatation. 1.8 cm partially calcified stone in the nondistended gallbladder. Pancreas: 1.1 cm well-demarcated cystic lesion in the midbody. No ductal dilatation. Spleen: Lobular contour, without enlargement or focal lesion. Adrenals/Urinary Tract: Bilateral adrenal hypertrophy. 1.2 cm partially exophytic right midpole low-attenuation lesion, nonspecific. No hydronephrosis. Urinary bladder physiologically distended. Stomach/Bowel: Moderate hiatal hernia. Stomach is nondistended. Small bowel decompressed. Appendix not discretely identified. Colon nondilated. Scattered descending colonic diverticula without adjacent inflammatory change. Lymphatic: No abdominal or pelvic adenopathy. Reproductive: Uterus and bilateral adnexa are unremarkable. Other: No ascites.  Bilateral pelvic phleboliths.  No free air. Musculoskeletal: Small paraumbilical hernia containing only mesenteric fat. Lumbar scoliosis, multilevel lumbar spondylitic change with grade 1 anterolisthesis L4-5 and L5-S1 probably related to facet DJD; no pars defect. Sclerosis in bilateral femoral  heads without subchondral collapse. No fracture or worrisome bone lesion. Review of the MIP images confirms the above findings. IMPRESSION: 1. Right pulmonary emboli, new since 05/13/2020. No CT evidence of right heart strain or pulmonary infarct. 2. Stable eccentric nonocclusive mural thrombus in the aortic isthmus and proximal descending thoracic segment. No dissection. 3. Small bilateral pleural effusions, left greater than right. 4. Lumbar scoliosis and multilevel lumbar spondylitic change. 5. Cholelithiasis. 6. Moderate hiatal hernia. 7. Descending colonic diverticulosis. 8. 1.1 cm well-demarcated cystic lesion in the midbody of the pancreas. Recommend follow-up pancreatic-protocol CT or MRI in 2 years. Electronically Signed   By: Lucrezia Europe M.D.   On: 05/16/2020 14:34   DG Hip Unilat W or Wo Pelvis 2-3 Views Left  Result Date: 05/13/2020 CLINICAL DATA:  Unwitnessed fall.  Pain. EXAM: DG HIP (WITH OR WITHOUT PELVIS) 2-3V LEFT COMPARISON:  None. FINDINGS: There is no evidence of hip fracture or dislocation. There is no evidence of arthropathy or other focal bone abnormality. IMPRESSION: Negative. Electronically Signed   By: Misty Stanley M.D.   On: 05/13/2020 08:04   VAS [place] LOWER EXTREMITY VENOUS (DVT)  Result Date: 05/17/2020  Lower Venous DVT Study Indications: Edema.  Comparison Study: no prior Performing Technologist: Abram Sander RVS  Examination Guidelines: A complete evaluation includes B-mode imaging, spectral Doppler, color Doppler, and power Doppler as needed of all accessible portions of each vessel. Bilateral testing is considered an integral part of a complete examination. Limited examinations for reoccurring indications may be performed as noted. The reflux portion of the exam is performed with the patient in reverse Trendelenburg.  +---------+---------------+---------+-----------+----------+--------------+ RIGHT  CompressibilityPhasicitySpontaneityPropertiesThrombus Aging  +---------+---------------+---------+-----------+----------+--------------+ CFV      Full           Yes      Yes                                 +---------+---------------+---------+-----------+----------+--------------+ SFJ      Full                                                        +---------+---------------+---------+-----------+----------+--------------+ FV Prox  Full                                                        +---------+---------------+---------+-----------+----------+--------------+ FV Mid   Full                                                        +---------+---------------+---------+-----------+----------+--------------+ FV DistalFull                                                        +---------+---------------+---------+-----------+----------+--------------+ PFV      Full                                                        +---------+---------------+---------+-----------+----------+--------------+ POP      Full           Yes      Yes                                 +---------+---------------+---------+-----------+----------+--------------+ PTV      Full                                                        +---------+---------------+---------+-----------+----------+--------------+ PERO     Full                                                        +---------+---------------+---------+-----------+----------+--------------+   +---------+---------------+---------+-----------+----------+--------------+ LEFT     CompressibilityPhasicitySpontaneityPropertiesThrombus Aging +---------+---------------+---------+-----------+----------+--------------+ CFV      Full           Yes      Yes                                 +---------+---------------+---------+-----------+----------+--------------+  SFJ      Full                                                         +---------+---------------+---------+-----------+----------+--------------+ FV Prox  Full                                                        +---------+---------------+---------+-----------+----------+--------------+ FV Mid   Full                                                        +---------+---------------+---------+-----------+----------+--------------+ FV DistalFull                                                        +---------+---------------+---------+-----------+----------+--------------+ PFV      Full                                                        +---------+---------------+---------+-----------+----------+--------------+ POP      Full           Yes      Yes                                 +---------+---------------+---------+-----------+----------+--------------+ PTV      Full                                                        +---------+---------------+---------+-----------+----------+--------------+ PERO     Full                                                        +---------+---------------+---------+-----------+----------+--------------+     Summary: BILATERAL: - No evidence of deep vein thrombosis seen in the lower extremities, bilaterally. - No evidence of superficial venous thrombosis in the lower extremities, bilaterally. -No evidence of popliteal cyst, bilaterally.   *See table(s) above for measurements and observations. Electronically signed by Servando Snare MD on 05/17/2020 at 10:19:12 PM.    Final      The results of significant diagnostics from this hospitalization (including imaging, microbiology, ancillary and laboratory) are listed below for reference.     Microbiology: Recent Results (from the past 240 hour(s))  Urine culture     Status: Abnormal   Collection Time: 05/13/20 10:11  AM   Specimen: Urine, Clean Catch  Result Value Ref Range Status   Specimen Description   Final    URINE, CLEAN CATCH Performed  at [facility], [place] [address], [place], [place] [address]    Special Requests   Final    NONE Performed at [facility], [place] [address], [place], [place] [address]    Culture >=100,000 COLONIES/mL ESCHERICHIA COLI (A)  Final   Report Status 05/15/2020 FINAL  Final   Organism ID, Bacteria ESCHERICHIA COLI (A)  Final      Susceptibility   Escherichia coli - MIC*    AMPICILLIN 8 SENSITIVE Sensitive     CEFAZOLIN <=4 SENSITIVE Sensitive     CEFEPIME <=0.12 SENSITIVE Sensitive     CEFTRIAXONE <=0.25 SENSITIVE Sensitive     CIPROFLOXACIN <=0.25 SENSITIVE Sensitive     GENTAMICIN <=1 SENSITIVE Sensitive     IMIPENEM <=0.25 SENSITIVE Sensitive     NITROFURANTOIN <=16 SENSITIVE Sensitive     TRIMETH/SULFA <=20 SENSITIVE Sensitive     AMPICILLIN/SULBACTAM <=2 SENSITIVE Sensitive     PIP/TAZO <=4 SENSITIVE Sensitive     * >=100,000 COLONIES/mL ESCHERICHIA COLI  Resp Panel by RT-PCR (Flu A&B, Covid) Nasopharyngeal Swab     Status: None   Collection Time: 05/13/20 11:31 AM   Specimen: Nasopharyngeal Swab; Nasopharyngeal(NP) swabs in vial transport medium  Result Value Ref Range Status   SARS Coronavirus 2 by RT PCR NEGATIVE NEGATIVE Final    Comment: (NOTE) SARS-CoV-2 target nucleic acids are NOT DETECTED.  The SARS-CoV-2 RNA is generally detectable in upper respiratory specimens during the acute phase of infection. The lowest concentration of SARS-CoV-2 viral copies this assay can detect is 138 copies/mL. A negative result does not preclude SARS-Cov-2 infection and should not be used as the sole basis for treatment or other patient management decisions. A negative result may occur with  improper specimen collection/handling, submission of specimen other than nasopharyngeal swab, presence of viral mutation(s) within the areas targeted by this assay, and inadequate number of viral copies(<138 copies/mL). A negative result must be combined  with clinical observations, patient history, and epidemiological information. The expected result is Negative.  Fact Sheet for Patients:  EntrepreneurPulse.com.au  Fact Sheet for Healthcare Providers:  IncredibleEmployment.be  This test is no t yet approved or cleared by the [place] FDA and  has been authorized for detection and/or diagnosis of SARS-CoV-2 by FDA under an Emergency Use Authorization (EUA). This EUA will remain  in effect (meaning this test can be used) for the duration of the COVID-19 declaration under Section 564(b)(1) of the Act, 21 U.S.C.section 360bbb-3(b)(1), unless the authorization is terminated  or revoked sooner.       Influenza A by PCR NEGATIVE NEGATIVE Final   Influenza B by PCR NEGATIVE NEGATIVE Final    Comment: (NOTE) The Xpert Xpress SARS-CoV-2/FLU/RSV plus assay is intended as an aid in the diagnosis of influenza from Nasopharyngeal swab specimens and should not be used as a sole basis for treatment. Nasal washings and aspirates are unacceptable for Xpert Xpress SARS-CoV-2/FLU/RSV testing.  Fact Sheet for Patients: EntrepreneurPulse.com.au  Fact Sheet for Healthcare Providers: IncredibleEmployment.be  This test is not yet approved or cleared by the [place] FDA and has been authorized for detection and/or diagnosis of SARS-CoV-2 by FDA under an Emergency Use Authorization (EUA). This EUA will remain in effect (meaning this test can be used) for the duration of the COVID-19 declaration under Section 564(b)(1) of the  Act, 21 U.S.C. section 360bbb-3(b)(1), unless the authorization is terminated or revoked.  Performed at [facility], [place] [address], [place], [place] [address]   MRSA PCR Screening     Status: None   Collection Time: 05/13/20 11:08 PM   Specimen: Nasopharyngeal  Result Value Ref Range Status   MRSA by PCR NEGATIVE NEGATIVE  Final    Comment:        The GeneXpert MRSA Assay (FDA approved for NASAL specimens only), is one component of a comprehensive MRSA colonization surveillance program. It is not intended to diagnose MRSA infection nor to guide or monitor treatment for MRSA infections. Performed at [facility], [place] [address] [place], [place] [address]   Resp Panel by RT-PCR (Flu A&B, Covid) Nasopharyngeal Swab     Status: None   Collection Time: 05/17/20  2:06 PM   Specimen: Nasopharyngeal Swab; Nasopharyngeal(NP) swabs in vial transport medium  Result Value Ref Range Status   SARS Coronavirus 2 by RT PCR NEGATIVE NEGATIVE Final    Comment: (NOTE) SARS-CoV-2 target nucleic acids are NOT DETECTED.  The SARS-CoV-2 RNA is generally detectable in upper respiratory specimens during the acute phase of infection. The lowest concentration of SARS-CoV-2 viral copies this assay can detect is 138 copies/mL. A negative result does not preclude SARS-Cov-2 infection and should not be used as the sole basis for treatment or other patient management decisions. A negative result may occur with  improper specimen collection/handling, submission of specimen other than nasopharyngeal swab, presence of viral mutation(s) within the areas targeted by this assay, and inadequate number of viral copies(<138 copies/mL). A negative result must be combined with clinical observations, patient history, and epidemiological information. The expected result is Negative.  Fact Sheet for Patients:  EntrepreneurPulse.com.au  Fact Sheet for Healthcare Providers:  IncredibleEmployment.be  This test is no t yet approved or cleared by the [place] FDA and  has been authorized for detection and/or diagnosis of SARS-CoV-2 by FDA under an Emergency Use Authorization (EUA). This EUA will remain  in effect (meaning this test can be used) for the duration of the COVID-19 declaration under  Section 564(b)(1) of the Act, 21 U.S.C.section 360bbb-3(b)(1), unless the authorization is terminated  or revoked sooner.       Influenza A by PCR NEGATIVE NEGATIVE Final   Influenza B by PCR NEGATIVE NEGATIVE Final    Comment: (NOTE) The Xpert Xpress SARS-CoV-2/FLU/RSV plus assay is intended as an aid in the diagnosis of influenza from Nasopharyngeal swab specimens and should not be used as a sole basis for treatment. Nasal washings and aspirates are unacceptable for Xpert Xpress SARS-CoV-2/FLU/RSV testing.  Fact Sheet for Patients: EntrepreneurPulse.com.au  Fact Sheet for Healthcare Providers: IncredibleEmployment.be  This test is not yet approved or cleared by the [place] FDA and has been authorized for detection and/or diagnosis of SARS-CoV-2 by FDA under an Emergency Use Authorization (EUA). This EUA will remain in effect (meaning this test can be used) for the duration of the COVID-19 declaration under Section 564(b)(1) of the Act, 21 U.S.C. section 360bbb-3(b)(1), unless the authorization is terminated or revoked.  Performed at [facility], [place] [address], [place], [place] [address]      Labs: BNP (last 3 results) Recent Labs    05/13/20 0719  BNP 914.7*   Basic Metabolic Panel: Recent Labs  Lab 05/14/20 1606 05/15/20 0025 05/16/20 0059 05/17/20 0026 05/18/20 0022  NA 135 137 133* 133* 134*  K 4.8 4.9  4.1 3.8 3.6  CL 104 105 102 106 106  CO2 24 28 25  20* 24  GLUCOSE 203* 110* 110* 101* 112*  BUN 10 10 11 13 14   CREATININE 0.75 0.53 0.55 0.52 0.59  CALCIUM 7.9* 8.0* 8.0* 7.8* 7.8*  MG 1.9 1.8 1.8 1.8 1.9   Liver Function Tests: Recent Labs  Lab 05/13/20 0719 05/14/20 0042  AST 30 27  ALT 30 30  ALKPHOS 69 64  BILITOT 1.1 1.5*  PROT 5.1* 4.6*  ALBUMIN 2.5* 2.2*   No results for input(s): LIPASE, AMYLASE in the last 168 hours. No results for input(s): AMMONIA in the last 168  hours. CBC: Recent Labs  Lab 05/13/20 0719 05/14/20 0042 05/15/20 0025 05/16/20 0059 05/17/20 0026 05/18/20 0022  WBC 9.1 7.9 6.2 6.9 7.8 9.5  NEUTROABS 6.7  --   --   --   --   --   HGB 14.6 13.4 12.9 12.7 12.5 11.7*  HCT 43.2 39.4 36.9 36.2 36.8 34.0*  MCV 97.5 96.8 96.6 96.5 97.6 97.1  PLT 220 208 199 200 198 251   Cardiac Enzymes: Recent Labs  Lab 05/13/20 0719 05/14/20 0817  CKTOTAL 32* 55   BNP: Invalid input(s): POCBNP CBG: No results for input(s): GLUCAP in the last 168 hours. D-Dimer No results for input(s): DDIMER in the last 72 hours. Hgb A1c Recent Labs    05/15/20 1136  HGBA1C 5.9*   Lipid Profile No results for input(s): CHOL, HDL, LDLCALC, TRIG, CHOLHDL, LDLDIRECT in the last 72 hours. Thyroid function studies No results for input(s): TSH, T4TOTAL, T3FREE, THYROIDAB in the last 72 hours.  Invalid input(s): FREET3 Anemia work up No results for input(s): VITAMINB12, FOLATE, FERRITIN, TIBC, IRON, RETICCTPCT in the last 72 hours. Urinalysis    Component Value Date/Time   COLORURINE YELLOW 05/13/2020 1011   APPEARANCEUR HAZY (A) 05/13/2020 1011   LABSPEC 1.008 05/13/2020 1011   PHURINE 6.0 05/13/2020 1011   GLUCOSEU NEGATIVE 05/13/2020 1011   HGBUR NEGATIVE 05/13/2020 1011   HGBUR negative 08/19/2008 0000   BILIRUBINUR NEGATIVE 05/13/2020 1011   BILIRUBINUR n 08/12/2013 1700   KETONESUR NEGATIVE 05/13/2020 1011   PROTEINUR NEGATIVE 05/13/2020 1011   UROBILINOGEN 0.2 08/12/2013 1700   UROBILINOGEN 1.0 04/17/2010 1448   NITRITE POSITIVE (A) 05/13/2020 1011   LEUKOCYTESUR MODERATE (A) 05/13/2020 1011   Sepsis Labs Invalid input(s): PROCALCITONIN,  WBC,  LACTICIDVEN Microbiology Recent Results (from the past 240 hour(s))  Urine culture     Status: Abnormal   Collection Time: 05/13/20 10:11 AM   Specimen: Urine, Clean Catch  Result Value Ref Range Status   Specimen Description   Final    URINE, CLEAN CATCH Performed at [facility], [place] [address], [place], [place] [address]    Special Requests   Final    NONE Performed at [facility], [place] [address] [place], [place] [address]    Culture >=100,000 COLONIES/mL ESCHERICHIA COLI (A)  Final   Report Status 05/15/2020 FINAL  Final   Organism ID, Bacteria ESCHERICHIA COLI (A)  Final      Susceptibility   Escherichia coli - MIC*    AMPICILLIN 8 SENSITIVE Sensitive     CEFAZOLIN <=4 SENSITIVE Sensitive     CEFEPIME <=0.12 SENSITIVE Sensitive     CEFTRIAXONE <=0.25 SENSITIVE Sensitive     CIPROFLOXACIN <=0.25 SENSITIVE Sensitive     GENTAMICIN <=1 SENSITIVE Sensitive     IMIPENEM <=0.25 SENSITIVE Sensitive  NITROFURANTOIN <=16 SENSITIVE Sensitive     TRIMETH/SULFA <=20 SENSITIVE Sensitive     AMPICILLIN/SULBACTAM <=2 SENSITIVE Sensitive     PIP/TAZO <=4 SENSITIVE Sensitive     * >=100,000 COLONIES/mL ESCHERICHIA COLI  Resp Panel by RT-PCR (Flu A&B, Covid) Nasopharyngeal Swab     Status: None   Collection Time: 05/13/20 11:31 AM   Specimen: Nasopharyngeal Swab; Nasopharyngeal(NP) swabs in vial transport medium  Result Value Ref Range Status   SARS Coronavirus 2 by RT PCR NEGATIVE NEGATIVE Final    Comment: (NOTE) SARS-CoV-2 target nucleic acids are NOT DETECTED.  The SARS-CoV-2 RNA is generally detectable in upper respiratory specimens during the acute phase of infection. The lowest concentration of SARS-CoV-2 viral copies this assay can detect is 138 copies/mL. A negative result does not preclude SARS-Cov-2 infection and should not be used as the sole basis for treatment or other patient management decisions. A negative result may occur with  improper specimen collection/handling, submission of specimen other than nasopharyngeal swab, presence of viral mutation(s) within the areas targeted by this assay, and inadequate number of viral copies(<138 copies/mL). A negative result must be combined with clinical  observations, patient history, and epidemiological information. The expected result is Negative.  Fact Sheet for Patients:  EntrepreneurPulse.com.au  Fact Sheet for Healthcare Providers:  IncredibleEmployment.be  This test is no t yet approved or cleared by the [place] FDA and  has been authorized for detection and/or diagnosis of SARS-CoV-2 by FDA under an Emergency Use Authorization (EUA). This EUA will remain  in effect (meaning this test can be used) for the duration of the COVID-19 declaration under Section 564(b)(1) of the Act, 21 U.S.C.section 360bbb-3(b)(1), unless the authorization is terminated  or revoked sooner.       Influenza A by PCR NEGATIVE NEGATIVE Final   Influenza B by PCR NEGATIVE NEGATIVE Final    Comment: (NOTE) The Xpert Xpress SARS-CoV-2/FLU/RSV plus assay is intended as an aid in the diagnosis of influenza from Nasopharyngeal swab specimens and should not be used as a sole basis for treatment. Nasal washings and aspirates are unacceptable for Xpert Xpress SARS-CoV-2/FLU/RSV testing.  Fact Sheet for Patients: EntrepreneurPulse.com.au  Fact Sheet for Healthcare Providers: IncredibleEmployment.be  This test is not yet approved or cleared by the [place] FDA and has been authorized for detection and/or diagnosis of SARS-CoV-2 by FDA under an Emergency Use Authorization (EUA). This EUA will remain in effect (meaning this test can be used) for the duration of the COVID-19 declaration under Section 564(b)(1) of the Act, 21 U.S.C. section 360bbb-3(b)(1), unless the authorization is terminated or revoked.  Performed at [facility], [place] [address] [place], [place] [address]   MRSA PCR Screening     Status: None   Collection Time: 05/13/20 11:08 PM   Specimen: Nasopharyngeal  Result Value Ref Range Status   MRSA by PCR NEGATIVE NEGATIVE Final     Comment:        The GeneXpert MRSA Assay (FDA approved for NASAL specimens only), is one component of a comprehensive MRSA colonization surveillance program. It is not intended to diagnose MRSA infection nor to guide or monitor treatment for MRSA infections. Performed at [facility], [place] [address], [place], [place] [address]   Resp Panel by RT-PCR (Flu A&B, Covid) Nasopharyngeal Swab     Status: None   Collection Time: 05/17/20  2:06 PM   Specimen: Nasopharyngeal Swab; Nasopharyngeal(NP) swabs in vial transport medium  Result Value Ref Range  Status   SARS Coronavirus 2 by RT PCR NEGATIVE NEGATIVE Final    Comment: (NOTE) SARS-CoV-2 target nucleic acids are NOT DETECTED.  The SARS-CoV-2 RNA is generally detectable in upper respiratory specimens during the acute phase of infection. The lowest concentration of SARS-CoV-2 viral copies this assay can detect is 138 copies/mL. A negative result does not preclude SARS-Cov-2 infection and should not be used as the sole basis for treatment or other patient management decisions. A negative result may occur with  improper specimen collection/handling, submission of specimen other than nasopharyngeal swab, presence of viral mutation(s) within the areas targeted by this assay, and inadequate number of viral copies(<138 copies/mL). A negative result must be combined with clinical observations, patient history, and epidemiological information. The expected result is Negative.  Fact Sheet for Patients:  EntrepreneurPulse.com.au  Fact Sheet for Healthcare Providers:  IncredibleEmployment.be  This test is no t yet approved or cleared by the [place] FDA and  has been authorized for detection and/or diagnosis of SARS-CoV-2 by FDA under an Emergency Use Authorization (EUA). This EUA will remain  in effect (meaning this test can be used) for the duration of the COVID-19 declaration under Section  564(b)(1) of the Act, 21 U.S.C.section 360bbb-3(b)(1), unless the authorization is terminated  or revoked sooner.       Influenza A by PCR NEGATIVE NEGATIVE Final   Influenza B by PCR NEGATIVE NEGATIVE Final    Comment: (NOTE) The Xpert Xpress SARS-CoV-2/FLU/RSV plus assay is intended as an aid in the diagnosis of influenza from Nasopharyngeal swab specimens and should not be used as a sole basis for treatment. Nasal washings and aspirates are unacceptable for Xpert Xpress SARS-CoV-2/FLU/RSV testing.  Fact Sheet for Patients: EntrepreneurPulse.com.au  Fact Sheet for Healthcare Providers: IncredibleEmployment.be  This test is not yet approved or cleared by the [place] FDA and has been authorized for detection and/or diagnosis of SARS-CoV-2 by FDA under an Emergency Use Authorization (EUA). This EUA will remain in effect (meaning this test can be used) for the duration of the COVID-19 declaration under Section 564(b)(1) of the Act, 21 U.S.C. section 360bbb-3(b)(1), unless the authorization is terminated or revoked.  Performed at [facility], [place] [address] [place], [place] [address]      Time coordinating discharge:  I have spent 35 minutes face to face with the patient and on the ward discussing the patients care, assessment, plan and disposition with other care givers. >50% of the time was devoted counseling the patient about the risks and benefits of treatment/Discharge disposition and coordinating care.   SIGNED:   Damita Lack, MD  [facility] 05/18/2020, 11:02 AM   If 7PM-7AM, please contact night-coverage

## 2020-05-18 NOTE — TOC Progression Note (Signed)
Transition of Care [facility]) - Progression Note    Patient Details  Name: Becky [name] MRN: [id number] Date of Birth: 02-03-1930  Transition of Care [facility]) CM/SW [place], [place] Phone Number: 05/18/2020, 11:33 AM  Clinical Narrative:    Late note 05/17/2020 CSW spoke with [employer], she is able to go back, CSW requested COVID. MD put in order and nurse has swabbed.  05/18/2020  PT Is ready for DC CSW is doing new insurance auth, COVID is back (negative).   Expected Discharge Plan: Skilled Nursing Facility Barriers to Discharge: Continued Medical Work up  Expected Discharge Plan and Services Expected Discharge Plan: [place]         Expected Discharge Date: 05/17/20                                     Social Determinants of Health (SDOH) Interventions    Readmission Risk Interventions No flowsheet data found.

## 2020-05-19 LAB — RESP PANEL BY RT-PCR (FLU A&B, COVID) ARPGX2
Influenza A by PCR: NEGATIVE
Influenza B by PCR: NEGATIVE
SARS Coronavirus 2 by RT PCR: NEGATIVE

## 2020-05-19 LAB — CBC
HCT: 33.9 % — ABNORMAL LOW (ref 36.0–46.0)
Hemoglobin: 11.4 g/dL — ABNORMAL LOW (ref 12.0–15.0)
MCH: 33.1 pg (ref 26.0–34.0)
MCHC: 33.6 g/dL (ref 30.0–36.0)
MCV: 98.5 fL (ref 80.0–100.0)
Platelets: 236 10*3/uL (ref 150–400)
RBC: 3.44 MIL/uL — ABNORMAL LOW (ref 3.87–5.11)
RDW: 13.2 % (ref 11.5–15.5)
WBC: 8.1 10*3/uL (ref 4.0–10.5)
nRBC: 0 % (ref 0.0–0.2)

## 2020-05-19 LAB — BASIC METABOLIC PANEL
Anion gap: 4 — ABNORMAL LOW (ref 5–15)
BUN: 15 mg/dL (ref 8–23)
CO2: 23 mmol/L (ref 22–32)
Calcium: 8 mg/dL — ABNORMAL LOW (ref 8.9–10.3)
Chloride: 105 mmol/L (ref 98–111)
Creatinine, Ser: 0.6 mg/dL (ref 0.44–1.00)
GFR, Estimated: >60 mL/min (ref >60)
Glucose, Bld: 121 mg/dL — ABNORMAL HIGH (ref 70–99)
Potassium: 3.9 mmol/L (ref 3.5–5.1)
Sodium: 132 mmol/L — ABNORMAL LOW (ref 135–145)

## 2020-05-19 LAB — MAGNESIUM: Magnesium: 1.9 mg/dL (ref 1.7–2.4)

## 2020-05-19 NOTE — Discharge Summary (Addendum)
Physician Discharge Summary  Becky [name] PYP:[id number] DOB: 1929/11/20 DOA: 05/13/2020   UPDATE TO D/C SUMMARY BY DR. Reesa Chew- date and meds    PCP: System, Provider Not In  Admit date: 05/13/2020 Discharge date: 05/19/2020  Admitted From: Home Disposition: SNF  Recommendations for Outpatient Follow-up:  1. Follow up with PCP in 1-2 weeks 2. Please obtain BMP/CBC in one week your next doctors visit.  3. Eliquis twice daily started 4. Pain medication with bowel regimen prescribed 5. Aspirin, Coreg, statin, lisinopril prescribed. 6. Eliquis twice daily 7. PPI daily.   Discharge Condition: Stable CODE STATUS: DNR Diet recommendation: Heart healthy  Brief/Interim Summary:  87 year old with history of significant dementia, anxiety, depression admitted from SNF due to unwitnessed fall.  Unable to provide any history but there is concerned that she had hit her head and falling on her left side.  Trauma work-up showed multiple rib fractures, hypokalemia and intramural aortic thrombus.  Vascular and trauma team were consulted.  CT of the head and neck were negative.  She was also found to have UTI started on Rocephin. Repeat CT showed incidental new PE started on [facility].   PT recommended SNF therefore arrangements made with the help of TOC team.   Assessment & Plan:   Principal Problem:   Rib fracture Active Problems:   Osteoporosis   MDD (major depressive disorder)   Malignant neoplasm of upper-outer quadrant of right breast in female, estrogen receptor positive ([place])   Fall   Intramural hematoma of thoracic aorta (HCC)   Pressure injury of skin   Chronic systolic CHF (congestive heart failure) ([place])   Pulmonary embolism (HCC)  Unwitnessed fall Left-sided rib fractures 7-11, closed -Plan will be for pain control, bowel regimen. -Seen by trauma service, signed off.  CT head and neck is negative.  No surgical intervention at this time.  Incentive spirometer -Supportive care.   PT/OT = SNF, TOC team to make arrangements for this. -Vitamin D-normal  Aortic arch proximal descending aortic intramural hematoma -Seen by vascular surgery, recommending blood pressure and pain control at this time.  Repeat CT abdomen pelvis per their service-per Vascular.  This appears to be stable for vascular therefore no further work-up needed  Right Sided PE, without cor pulmonale  Okay with vascular for anticoagulation.  Initially on heparin drip transition to oral Eliquis.  Lower extremity Dopplers negative for DVT.  Will need anticoagulation minimum of 3 to 6 months.  Follow-up outpatient with PCP and or oncology.  Acute congestive heart failure with reduced ejection fraction, EF 30-35% -Global hypokinesia.  Mild to moderate aortic valve -Patient is on Anastrozole, spoke with Dr Jana Hakim. She has not received any chemo that could the culprilt.  Spoke with Dr. Lovena Le from cardiology, recommend medical management starting patient on routine cardiac medication.  Started patient on Coreg, lisinopril and statin.  Once hematoma stable, can also add aspirin.  Their service will arrange for outpatient follow-up. -A1c 5.9, LDL 86 Because patient will be on aspirin and Eliquis, I have added PPI daily.  Hypokalemia -Repleted  Urinary tract infection -Transition to p.o. Keflex.  Last day 3/22.  Ucx = E. coli, pansensitive.  Last gave antibiotics today  Anxiety/depression Dementia -Supportive care.  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  On anastrozole.  Follows with outpatient Dr. Jana Hakim   PT/OT-SNF  Body mass index is 18.53 kg/m.  Pressure Injury 05/13/20 Buttocks Left Stage 1 -  Intact skin with non-blanchable redness  of a localized area usually over a bony prominence. Intact red and non blanchable area from prior Purewick malposition (Active)  05/13/20 2310  Location: Buttocks  Location Orientation: Left  Staging:  Stage 1 -  Intact skin with non-blanchable redness of a localized area usually over a bony prominence.  Wound Description (Comments): Intact red and non blanchable area from prior Purewick malposition  Present on Admission: Yes        Discharge Diagnoses:  Principal Problem:   Rib fracture Active Problems:   Osteoporosis   MDD (major depressive disorder)   Malignant neoplasm of upper-outer quadrant of right breast in female, estrogen receptor positive ([place])   Fall   Intramural hematoma of thoracic aorta (HCC)   Pressure injury of skin   Chronic systolic CHF (congestive heart failure) ([place])   Pulmonary embolism ([place])      Consultations:  Vascular  General Surgery    Discharge Exam: Vitals:   05/19/20 0741 05/19/20 1201  BP: 95/69 (!) 95/55  Pulse: 87   Resp: 18 20  Temp: 98.3 F (36.8 C) 97.9 F (36.6 C)  SpO2: 91%    Vitals:   05/19/20 0300 05/19/20 0636 05/19/20 0741 05/19/20 1201  BP: 102/64 103/68 95/69 (!) 95/55  Pulse: 87 94 87   Resp: 15  18 20   Temp: 98 F (36.7 C)  98.3 F (36.8 C) 97.9 F (36.6 C)  TempSrc: Oral  Oral Oral  SpO2: 95%  91%   Weight:      Height:          Discharge Instructions   Allergies as of 05/19/2020      Reactions   Brimonidine    Unknown - Listed on MAR      Medication List    TAKE these medications   acetaminophen 500 MG tablet Commonly known as: TYLENOL Take 500 mg by mouth every 6 (six) hours as needed (for pain).   anastrozole 1 MG tablet Commonly known as: ARIMIDEX Take 1 tablet (1 mg total) by mouth daily.   apixaban 5 MG Tabs tablet Commonly known as: ELIQUIS Take 1 tablet (5 mg total) by mouth 2 (two) times daily.   aspirin 81 MG EC tablet Take 1 tablet (81 mg total) by mouth daily. Swallow whole.   atorvastatin 20 MG tablet Commonly known as: LIPITOR Take 1 tablet (20 mg total) by mouth daily.   carvedilol 3.125 MG tablet Commonly known as: COREG Take 1 tablet (3.125 mg total) by  mouth 2 (two) times daily with a meal.   cholestyramine 4 g packet Commonly known as: QUESTRAN Take 1 packet by mouth in the morning and at bedtime. Mix with 8-oz of fluid   cyanocobalamin 1000 MCG/ML injection Commonly known as: (VITAMIN B-12) Inject 1,000 mcg into the muscle every 30 (thirty) days. On the 21st of each month   HYDROcodone-acetaminophen 5-325 MG tablet Commonly known as: NORCO/VICODIN Take 1-2 tablets by mouth every 4 (four) hours as needed for moderate pain.   lisinopril 5 MG tablet Commonly known as: ZESTRIL Take 1 tablet (5 mg total) by mouth daily.   loperamide 2 MG tablet Commonly known as: IMODIUM A-D Take 4 mg by mouth daily as needed for diarrhea or loose stools.   multivitamin with minerals Tabs tablet Take 1 tablet by mouth daily.   pantoprazole 40 MG tablet Commonly known as: PROTONIX Take 1 tablet (40 mg total) by mouth daily before breakfast.   predniSONE 2.5 MG tablet Commonly known as: DELTASONE  Take 2.5 mg by mouth daily.   RA NUTRITIONAL SUPPORT PO Take 120 mLs by mouth in the morning and at bedtime. Medpass   REFRESH OP Place 1 drop into both eyes in the morning, at noon, in the evening, and at bedtime.   sertraline 50 MG tablet Commonly known as: ZOLOFT Take 50 mg by mouth daily.   sodium chloride 5 % ophthalmic ointment Commonly known as: MURO 294 Place 1 application into the left eye in the morning and at bedtime.   traMADol 50 MG tablet Commonly known as: ULTRAM Take 1 tablet (50 mg total) by mouth every 6 (six) hours as needed for moderate pain.   Travoprost (BAK Free) 0.004 % Soln ophthalmic solution Commonly known as: TRAVATAN Place 1 drop into the right eye at bedtime.   Vitamin D3 125 MCG (5000 UT) Tabs Take 10,000 Units by mouth every Tuesday.       Allergies  Allergen Reactions  . Brimonidine     Unknown - Listed on MAR    You were cared for by a hospitalist during your hospital stay. If you have any  questions about your discharge medications or the care you received while you were in the hospital after you are discharged, you can call the unit and asked to speak with the hospitalist on call if the hospitalist that took care of you is not available. Once you are discharged, your primary care physician will handle any further medical issues. Please note that no refills for any discharge medications will be authorized once you are discharged, as it is imperative that you return to your primary care physician (or establish a relationship with a primary care physician if you do not have one) for your aftercare needs so that they can reassess your need for medications and monitor your lab values.   Procedures/Studies: DG Chest 2 View  Result Date: 05/13/2020 CLINICAL DATA:  [place]. EXAM: CHEST - 2 VIEW COMPARISON:  04/20/2019 FINDINGS: Left base atelectasis or infiltrate noted with probable small left pleural effusion. Skin fold overlies the lower left hemithorax. Right lung clear. Cardiopericardial silhouette is at upper limits of normal for size. Degenerative changes noted in both shoulders. Telemetry leads overlie the chest. IMPRESSION: Left base atelectasis or infiltrate with probable small left pleural effusion. Electronically Signed   By: Misty Stanley M.D.   On: 05/13/2020 08:06   DG Ribs Unilateral Left  Result Date: 05/13/2020 CLINICAL DATA:  Unwitnessed fall left-sided pain EXAM: LEFT RIBS - 2 VIEW COMPARISON:  Chest x-ray from earlier today FINDINGS: Left lateral seventh, eighth, ninth, tenth, and eleventh rib fractures. The ninth rib fracture is displaced. Atelectasis without pneumothorax or large pleural effusion. Thoracolumbar spine degeneration with scoliosis. IMPRESSION: Left seventh through eleventh rib fractures with up to mild displacement. Electronically Signed   By: Monte Fantasia M.D.   On: 05/13/2020 10:18   CT Head Wo Contrast  Result Date: 05/13/2020 CLINICAL DATA:  Unwitnessed  fall EXAM: CT HEAD WITHOUT CONTRAST TECHNIQUE: Contiguous axial images were obtained from the base of the skull through the vertex without intravenous contrast. COMPARISON:  08/08/2016 FINDINGS: Brain: There is atrophy and chronic small vessel disease changes. No acute intracranial abnormality. Specifically, no hemorrhage, hydrocephalus, mass lesion, acute infarction, or significant intracranial injury. Vascular: No hyperdense vessel or unexpected calcification. Skull: No acute calvarial abnormality. Sinuses/Orbits: No acute findings Other: None IMPRESSION: Atrophy, chronic microvascular disease. No acute intracranial abnormality. Electronically Signed   By: Rolm Baptise M.D.   On:  05/13/2020 08:43   CT Chest W Contrast  Result Date: 05/13/2020 CLINICAL DATA:  Pain following fall EXAM: CT CHEST WITH CONTRAST TECHNIQUE: Multidetector CT imaging of the chest was performed during intravenous contrast administration. CONTRAST:  79mL OMNIPAQUE IOHEXOL 300 MG/ML  SOLN COMPARISON:  Chest CT April 20, 2019; rib radiographs May 13, 2020 FINDINGS: Cardiovascular: No thoracic aortic aneurysm or dissection is evident. Foci of calcification noted in visualized great vessels. There is aortic atherosclerosis. There is apparent intramural hematoma in the posterior aortic extending from the arch to the midportion causing approximately 50% diameter narrowing. No ulceration in this area. No mediastinal hematoma evident. No plaque ulceration is appreciable by CT. There are foci of coronary artery calcification. There is no pericardial thickening. A fairly small amount of pericardial fluid is noted superiorly which is somewhat similar to prior study and may be within physiologic range. No major vessel pulmonary embolus. Mediastinum/Nodes: Thyroid appears unremarkable. No appreciable thoracic adenopathy. There is a paraesophageal hernia extending to the left, also present previously. Lungs/Pleura: There is atelectatic change in  each lung base, more severe on the left than on the right with small amount of pleural fluid on the left. Mild consolidation also noted in the left base. No appreciable pneumothorax on the left. No pulmonary edema appreciable. Trachea and major bronchial structures appear patent. Upper Abdomen: There is hepatic steatosis. There is upper abdominal aortic atherosclerosis. Visualized liver and spleen appear intact. Musculoskeletal: There are fractures of the lateral left fifth, lateral seventh, lateral eighth, lateral ninth, and lateral tenth ribs on the left with displacement of lateral left ninth and tenth ribs. Questionable nondisplaced fracture lateral left sixth rib. No blastic or lytic bone lesions are evident. There is degenerative change in the thoracic spine. No appreciable chest wall lesions. Previously noted mass in the right breast no longer evident. IMPRESSION: 1. Multiple rib fractures on the left. Small left pleural effusion but no pneumothorax. 2. Bibasilar atelectasis, more severe on the left than the right with questionable developing infiltrate left base. 3. Apparent intramural hematoma in the aorta posteriorly involving the arch and proximal to mid descending aorta. No aneurysm or dissection. No mediastinal hematoma. There is aortic atherosclerosis. 4.  No adenopathy. 5.  Paraesophageal hernia again noted. 6.  Hepatic steatosis. 7. Fairly small amount of pericardial fluid superiorly, stable compared to the February 2021 study. Aortic Atherosclerosis (ICD10-I70.0). Electronically Signed   By: Lowella Grip III M.D.   On: 05/13/2020 12:09   CT Cervical Spine Wo Contrast  Result Date: 05/13/2020 CLINICAL DATA:  Unwitnessed fall. EXAM: CT CERVICAL SPINE WITHOUT CONTRAST TECHNIQUE: Multidetector CT imaging of the cervical spine was performed without intravenous contrast. Multiplanar CT image reconstructions were also generated. COMPARISON:  08/08/2016 FINDINGS: Alignment: Slight anterolisthesis  of C4 on C5 related to facet disease. No subluxation. Skull base and vertebrae: No acute fracture. No primary bone lesion or focal pathologic process. Soft tissues and spinal canal: No prevertebral fluid or swelling. No visible canal hematoma. Disc levels: Diffuse advanced degenerative disc disease and facet disease. Upper chest: No acute findings Other: None IMPRESSION: Advanced cervical spondylosis. No acute bony abnormality. Electronically Signed   By: Rolm Baptise M.D.   On: 05/13/2020 08:45   DG CHEST PORT 1 VIEW  Result Date: 05/14/2020 CLINICAL DATA:  87 year old female with chest pain. Fall with minimally displaced left lateral rib fractures. EXAM: PORTABLE CHEST 1 VIEW COMPARISON:  Chest CT yesterday. Chest radiographs 05/13/2020 and earlier. FINDINGS: Portable AP upright view at 0606  hours. Left lateral lower rib fractures as demonstrated by CT. No pneumothorax. Stable left lung atelectasis. No significant pleural effusion is evident. Stable cardiac size and mediastinal contours. Right lung base atelectasis appears regressed. Stable right chest wall surgical clips. Negative visible bowel gas pattern. Stable visualized osseous structures. IMPRESSION: Left lower rib fractures with stable left lung base atelectasis. No pneumothorax. Electronically Signed   By: Genevie Ann M.D.   On: 05/14/2020 08:31   ECHOCARDIOGRAM COMPLETE  Result Date: 05/14/2020    ECHOCARDIOGRAM REPORT   Patient Name:   CORTNE AMARA Gatchalian Date of Exam: 05/14/2020 Medical Rec #:  [id number]      Height:       65.0 in Accession #:    [id number]     Weight:       111.3 lb Date of Birth:  October 30, 1929      BSA:          1.542 m Patient Age:    55 years       BP:           108/65 mmHg Patient Gender: F              HR:           83 bpm. Exam Location:  Inpatient Procedure: 2D Echo Indications:    syncope  History:        Patient has no prior history of Echocardiogram examinations.                 COPD.  Sonographer:    Johny Chess Referring  Phys: [id number] Jonnie Finner  Sonographer Comments: Left rib fractures. IMPRESSIONS  1. Left ventricular ejection fraction, by estimation, is 30 to 35%. The left ventricle has moderately decreased function. The left ventricle demonstrates global hypokinesis. Left ventricular diastolic parameters are indeterminate.  2. Right ventricular systolic function is normal. The right ventricular size is normal.  3. The mitral valve is normal in structure. No evidence of mitral valve regurgitation. No evidence of mitral stenosis.  4. The aortic valve has an indeterminant number of cusps. Aortic valve regurgitation is mild. Mild to moderate aortic valve sclerosis/calcification is present, without any evidence of aortic stenosis.  5. The inferior vena cava is normal in size with greater than 50% respiratory variability, suggesting right atrial pressure of 3 mmHg. FINDINGS  Left Ventricle: Left ventricular ejection fraction, by estimation, is 30 to 35%. The left ventricle has moderately decreased function. The left ventricle demonstrates global hypokinesis. The left ventricular internal cavity size was normal in size. There is no left ventricular hypertrophy. Abnormal (paradoxical) septal motion, consistent with left bundle branch block. Left ventricular diastolic parameters are indeterminate. Right Ventricle: The right ventricular size is normal. N Right ventricular systolic function is normal. Left Atrium: Left atrial size was normal in size. Right Atrium: Right atrial size was normal in size. Pericardium: There is no evidence of pericardial effusion. Mitral Valve: The mitral valve is normal in structure. Mild mitral annular calcification. No evidence of mitral valve regurgitation. No evidence of mitral valve stenosis. Tricuspid Valve: The tricuspid valve is normal in structure. Tricuspid valve regurgitation is trivial. No evidence of tricuspid stenosis. Aortic Valve: The aortic valve has an indeterminant number of cusps. Aortic  valve regurgitation is mild. Aortic regurgitation PHT measures 251 msec. Mild to moderate aortic valve sclerosis/calcification is present, without any evidence of aortic stenosis. Pulmonic Valve: The pulmonic valve was not well visualized. Pulmonic valve regurgitation is not visualized. No evidence  of pulmonic stenosis. Aorta: The aortic root is normal in size and structure. Venous: The inferior vena cava is normal in size with greater than 50% respiratory variability, suggesting right atrial pressure of 3 mmHg.  LEFT VENTRICLE PLAX 2D LVIDd:         4.80 cm     Diastology LVIDs:         4.00 cm     LV e' lateral: 5.00 cm/s LV PW:         0.80 cm LV IVS:        0.70 cm LVOT diam:     1.80 cm LV SV:         29 LV SV Index:   19 LVOT Area:     2.54 cm  LV Volumes (MOD) LV vol d, MOD A2C: 68.3 ml LV vol d, MOD A4C: 59.9 ml LV vol s, MOD A2C: 42.4 ml LV vol s, MOD A4C: 46.6 ml LV SV MOD A2C:     25.9 ml LV SV MOD A4C:     59.9 ml LV SV MOD BP:      20.6 ml RIGHT VENTRICLE             IVC RV S prime:     10.70 cm/s  IVC diam: 1.30 cm LEFT ATRIUM           Index LA Vol (A2C): 30.2 ml 19.58 ml/m LA Vol (A4C): 31.2 ml 20.23 ml/m  AORTIC VALVE LVOT Vmax:   69.90 cm/s LVOT Vmean:  42.800 cm/s LVOT VTI:    0.115 m AI PHT:      251 msec  AORTA Ao Root diam: 3.50 cm Ao Asc diam:  3.30 cm  SHUNTS Systemic VTI:  0.12 m Systemic Diam: 1.80 cm Kirk Ruths MD Electronically signed by Kirk Ruths MD Signature Date/Time: 05/14/2020/5:15:49 PM    Final    CT Angio Chest/Abd/Pel for Dissection W and/or W/WO  Result Date: 05/16/2020 CLINICAL DATA:  Abdominal and back pain, possible aortic dissection EXAM: CT ANGIOGRAPHY CHEST, ABDOMEN AND PELVIS TECHNIQUE: Multidetector CT imaging through the chest, abdomen and pelvis was performed using the standard protocol during bolus administration of intravenous contrast. Multiplanar reconstructed images and MIPs were obtained and reviewed to evaluate the vascular anatomy. CONTRAST:   172mL OMNIPAQUE IOHEXOL 350 MG/ML SOLN COMPARISON:  05/13/2020 and previous FINDINGS: CTA CHEST FINDINGS Cardiovascular: mild cardiomegaly. Small volume pericardial fluid. Good contrast opacification of the pulmonary arterial tree. Partially occlusive embolus, right upper lobe pulmonary artery extending into segmental branches. Subsegmental embolus, superior segment right lower lobe pulmonary artery branch. No definite contralateral emboli. There is good contrast opacification of the thoracic aorta. No dissection or stenosis. Eccentric nonocclusive mural thrombus in the isthmus and proximal descending segment, stable. Aortic Root: --Valve: 2.4 cm --Sinuses: 3.7 cm --Sinotubular Junction: 3.2 cm Limitations by motion: Mild Thoracic Aorta: --Ascending Aorta: 3.6 cm --Aortic Arch: 3.5 cm --Descending Aorta: 3.6 cm Mediastinum/Nodes: Moderate hiatal hernia.  No mass or adenopathy. Lungs/Pleura: Small pleural effusions, left greater than right. No pneumothorax. Dependent subsegmental atelectasis in the right lower lobe. More extensive dependent and basilar atelectasis, left lower lobe. Musculoskeletal: Anterior vertebral endplate spurring at multiple levels in the mid and lower thoracic spine. Bilateral shoulder DJD. No fracture or worrisome bone lesion. Review of the MIP images confirms the above findings. CTA ABDOMEN AND PELVIS FINDINGS VASCULAR Aorta: Mild tortuosity. Moderate calcified atheromatous plaque. No aneurysm, dissection, or stenosis. Celiac: Patent without evidence of aneurysm, dissection, vasculitis or  significant stenosis. SMA: Partially calcified ostial plaque without significant stenosis, patent distally with classic branch anatomy. Renals: Single left with partially calcified plaque in its mid and distal segment, no high-grade stenosis. Duplicated right, superior dominant with partially calcified plaque just beyond the ostium resulting in short segment stenosis of at least mild severity, patent  distally. IMA: Patent without evidence of aneurysm, dissection, vasculitis or significant stenosis. Inflow: Mild tortuosity. Scattered calcified plaque. No aneurysm, dissection, or stenosis. Veins: No obvious venous abnormality within the limitations of this arterial phase study. Review of the MIP images confirms the above findings. NON-VASCULAR Hepatobiliary: No liver lesion or biliary ductal dilatation. 1.8 cm partially calcified stone in the nondistended gallbladder. Pancreas: 1.1 cm well-demarcated cystic lesion in the midbody. No ductal dilatation. Spleen: Lobular contour, without enlargement or focal lesion. Adrenals/Urinary Tract: Bilateral adrenal hypertrophy. 1.2 cm partially exophytic right midpole low-attenuation lesion, nonspecific. No hydronephrosis. Urinary bladder physiologically distended. Stomach/Bowel: Moderate hiatal hernia. Stomach is nondistended. Small bowel decompressed. Appendix not discretely identified. Colon nondilated. Scattered descending colonic diverticula without adjacent inflammatory change. Lymphatic: No abdominal or pelvic adenopathy. Reproductive: Uterus and bilateral adnexa are unremarkable. Other: No ascites.  Bilateral pelvic phleboliths.  No free air. Musculoskeletal: Small paraumbilical hernia containing only mesenteric fat. Lumbar scoliosis, multilevel lumbar spondylitic change with grade 1 anterolisthesis L4-5 and L5-S1 probably related to facet DJD; no pars defect. Sclerosis in bilateral femoral heads without subchondral collapse. No fracture or worrisome bone lesion. Review of the MIP images confirms the above findings. IMPRESSION: 1. Right pulmonary emboli, new since 05/13/2020. No CT evidence of right heart strain or pulmonary infarct. 2. Stable eccentric nonocclusive mural thrombus in the aortic isthmus and proximal descending thoracic segment. No dissection. 3. Small bilateral pleural effusions, left greater than right. 4. Lumbar scoliosis and multilevel lumbar  spondylitic change. 5. Cholelithiasis. 6. Moderate hiatal hernia. 7. Descending colonic diverticulosis. 8. 1.1 cm well-demarcated cystic lesion in the midbody of the pancreas. Recommend follow-up pancreatic-protocol CT or MRI in 2 years. Electronically Signed   By: Lucrezia Europe M.D.   On: 05/16/2020 14:34   DG Hip Unilat W or Wo Pelvis 2-3 Views Left  Result Date: 05/13/2020 CLINICAL DATA:  Unwitnessed fall.  Pain. EXAM: DG HIP (WITH OR WITHOUT PELVIS) 2-3V LEFT COMPARISON:  None. FINDINGS: There is no evidence of hip fracture or dislocation. There is no evidence of arthropathy or other focal bone abnormality. IMPRESSION: Negative. Electronically Signed   By: Misty Stanley M.D.   On: 05/13/2020 08:04   VAS [place] LOWER EXTREMITY VENOUS (DVT)  Result Date: 05/17/2020  Lower Venous DVT Study Indications: Edema.  Comparison Study: no prior Performing Technologist: Abram Sander RVS  Examination Guidelines: A complete evaluation includes B-mode imaging, spectral Doppler, color Doppler, and power Doppler as needed of all accessible portions of each vessel. Bilateral testing is considered an integral part of a complete examination. Limited examinations for reoccurring indications may be performed as noted. The reflux portion of the exam is performed with the patient in reverse Trendelenburg.  +---------+---------------+---------+-----------+----------+--------------+ RIGHT    CompressibilityPhasicitySpontaneityPropertiesThrombus Aging +---------+---------------+---------+-----------+----------+--------------+ CFV      Full           Yes      Yes                                 +---------+---------------+---------+-----------+----------+--------------+ SFJ      Full                                                        +---------+---------------+---------+-----------+----------+--------------+  FV Prox  Full                                                         +---------+---------------+---------+-----------+----------+--------------+ FV Mid   Full                                                        +---------+---------------+---------+-----------+----------+--------------+ FV DistalFull                                                        +---------+---------------+---------+-----------+----------+--------------+ PFV      Full                                                        +---------+---------------+---------+-----------+----------+--------------+ POP      Full           Yes      Yes                                 +---------+---------------+---------+-----------+----------+--------------+ PTV      Full                                                        +---------+---------------+---------+-----------+----------+--------------+ PERO     Full                                                        +---------+---------------+---------+-----------+----------+--------------+   +---------+---------------+---------+-----------+----------+--------------+ LEFT     CompressibilityPhasicitySpontaneityPropertiesThrombus Aging +---------+---------------+---------+-----------+----------+--------------+ CFV      Full           Yes      Yes                                 +---------+---------------+---------+-----------+----------+--------------+ SFJ      Full                                                        +---------+---------------+---------+-----------+----------+--------------+ FV Prox  Full                                                        +---------+---------------+---------+-----------+----------+--------------+  FV Mid   Full                                                        +---------+---------------+---------+-----------+----------+--------------+ FV DistalFull                                                         +---------+---------------+---------+-----------+----------+--------------+ PFV      Full                                                        +---------+---------------+---------+-----------+----------+--------------+ POP      Full           Yes      Yes                                 +---------+---------------+---------+-----------+----------+--------------+ PTV      Full                                                        +---------+---------------+---------+-----------+----------+--------------+ PERO     Full                                                        +---------+---------------+---------+-----------+----------+--------------+     Summary: BILATERAL: - No evidence of deep vein thrombosis seen in the lower extremities, bilaterally. - No evidence of superficial venous thrombosis in the lower extremities, bilaterally. -No evidence of popliteal cyst, bilaterally.   *See table(s) above for measurements and observations. Electronically signed by Servando Snare MD on 05/17/2020 at 10:19:12 PM.    Final      The results of significant diagnostics from this hospitalization (including imaging, microbiology, ancillary and laboratory) are listed below for reference.     Microbiology: Recent Results (from the past 240 hour(s))  Urine culture     Status: Abnormal   Collection Time: 05/13/20 10:11 AM   Specimen: Urine, Clean Catch  Result Value Ref Range Status   Specimen Description   Final    URINE, CLEAN CATCH Performed at [facility], [place] [address] [place], [place] [address]    Special Requests   Final    NONE Performed at [facility], [place] [address] [place],  [address]    Culture >=100,000 COLONIES/mL ESCHERICHIA COLI (A)  Final   Report Status 05/15/2020 FINAL  Final   Organism ID, Bacteria ESCHERICHIA COLI (A)  Final      Susceptibility   Escherichia coli - MIC*    AMPICILLIN 8 SENSITIVE Sensitive      CEFAZOLIN <=4 SENSITIVE Sensitive  CEFEPIME <=0.12 SENSITIVE Sensitive     CEFTRIAXONE <=0.25 SENSITIVE Sensitive     CIPROFLOXACIN <=0.25 SENSITIVE Sensitive     GENTAMICIN <=1 SENSITIVE Sensitive     IMIPENEM <=0.25 SENSITIVE Sensitive     NITROFURANTOIN <=16 SENSITIVE Sensitive     TRIMETH/SULFA <=20 SENSITIVE Sensitive     AMPICILLIN/SULBACTAM <=2 SENSITIVE Sensitive     PIP/TAZO <=4 SENSITIVE Sensitive     * >=100,000 COLONIES/mL ESCHERICHIA COLI  Resp Panel by RT-PCR (Flu A&B, Covid) Nasopharyngeal Swab     Status: None   Collection Time: 05/13/20 11:31 AM   Specimen: Nasopharyngeal Swab; Nasopharyngeal(NP) swabs in vial transport medium  Result Value Ref Range Status   SARS Coronavirus 2 by RT PCR NEGATIVE NEGATIVE Final    Comment: (NOTE) SARS-CoV-2 target nucleic acids are NOT DETECTED.  The SARS-CoV-2 RNA is generally detectable in upper respiratory specimens during the acute phase of infection. The lowest concentration of SARS-CoV-2 viral copies this assay can detect is 138 copies/mL. A negative result does not preclude SARS-Cov-2 infection and should not be used as the sole basis for treatment or other patient management decisions. A negative result may occur with  improper specimen collection/handling, submission of specimen other than nasopharyngeal swab, presence of viral mutation(s) within the areas targeted by this assay, and inadequate number of viral copies(<138 copies/mL). A negative result must be combined with clinical observations, patient history, and epidemiological information. The expected result is Negative.  Fact Sheet for Patients:  EntrepreneurPulse.com.au  Fact Sheet for Healthcare Providers:  IncredibleEmployment.be  This test is no t yet approved or cleared by the [place] FDA and  has been authorized for detection and/or diagnosis of SARS-CoV-2 by FDA under an Emergency Use Authorization (EUA). This  EUA will remain  in effect (meaning this test can be used) for the duration of the COVID-19 declaration under Section 564(b)(1) of the Act, 21 U.S.C.section 360bbb-3(b)(1), unless the authorization is terminated  or revoked sooner.       Influenza A by PCR NEGATIVE NEGATIVE Final   Influenza B by PCR NEGATIVE NEGATIVE Final    Comment: (NOTE) The Xpert Xpress SARS-CoV-2/FLU/RSV plus assay is intended as an aid in the diagnosis of influenza from Nasopharyngeal swab specimens and should not be used as a sole basis for treatment. Nasal washings and aspirates are unacceptable for Xpert Xpress SARS-CoV-2/FLU/RSV testing.  Fact Sheet for Patients: EntrepreneurPulse.com.au  Fact Sheet for Healthcare Providers: IncredibleEmployment.be  This test is not yet approved or cleared by the [place] FDA and has been authorized for detection and/or diagnosis of SARS-CoV-2 by FDA under an Emergency Use Authorization (EUA). This EUA will remain in effect (meaning this test can be used) for the duration of the COVID-19 declaration under Section 564(b)(1) of the Act, 21 U.S.C. section 360bbb-3(b)(1), unless the authorization is terminated or revoked.  Performed at [facility], [place] [address], [place], [place] [address]   MRSA PCR Screening     Status: None   Collection Time: 05/13/20 11:08 PM   Specimen: Nasopharyngeal  Result Value Ref Range Status   MRSA by PCR NEGATIVE NEGATIVE Final    Comment:        The GeneXpert MRSA Assay (FDA approved for NASAL specimens only), is one component of a comprehensive MRSA colonization surveillance program. It is not intended to diagnose MRSA infection nor to guide or monitor treatment for MRSA infections. Performed at [facility], [place] [address] [place], [place] [address]  Resp Panel by RT-PCR (Flu A&B, Covid) Nasopharyngeal Swab     Status: None   Collection Time: 05/17/20  2:06  PM   Specimen: Nasopharyngeal Swab; Nasopharyngeal(NP) swabs in vial transport medium  Result Value Ref Range Status   SARS Coronavirus 2 by RT PCR NEGATIVE NEGATIVE Final    Comment: (NOTE) SARS-CoV-2 target nucleic acids are NOT DETECTED.  The SARS-CoV-2 RNA is generally detectable in upper respiratory specimens during the acute phase of infection. The lowest concentration of SARS-CoV-2 viral copies this assay can detect is 138 copies/mL. A negative result does not preclude SARS-Cov-2 infection and should not be used as the sole basis for treatment or other patient management decisions. A negative result may occur with  improper specimen collection/handling, submission of specimen other than nasopharyngeal swab, presence of viral mutation(s) within the areas targeted by this assay, and inadequate number of viral copies(<138 copies/mL). A negative result must be combined with clinical observations, patient history, and epidemiological information. The expected result is Negative.  Fact Sheet for Patients:  EntrepreneurPulse.com.au  Fact Sheet for Healthcare Providers:  IncredibleEmployment.be  This test is no t yet approved or cleared by the [place] FDA and  has been authorized for detection and/or diagnosis of SARS-CoV-2 by FDA under an Emergency Use Authorization (EUA). This EUA will remain  in effect (meaning this test can be used) for the duration of the COVID-19 declaration under Section 564(b)(1) of the Act, 21 U.S.C.section 360bbb-3(b)(1), unless the authorization is terminated  or revoked sooner.       Influenza A by PCR NEGATIVE NEGATIVE Final   Influenza B by PCR NEGATIVE NEGATIVE Final    Comment: (NOTE) The Xpert Xpress SARS-CoV-2/FLU/RSV plus assay is intended as an aid in the diagnosis of influenza from Nasopharyngeal swab specimens and should not be used as a sole basis for treatment. Nasal washings and aspirates are  unacceptable for Xpert Xpress SARS-CoV-2/FLU/RSV testing.  Fact Sheet for Patients: EntrepreneurPulse.com.au  Fact Sheet for Healthcare Providers: IncredibleEmployment.be  This test is not yet approved or cleared by the [place] FDA and has been authorized for detection and/or diagnosis of SARS-CoV-2 by FDA under an Emergency Use Authorization (EUA). This EUA will remain in effect (meaning this test can be used) for the duration of the COVID-19 declaration under Section 564(b)(1) of the Act, 21 U.S.C. section 360bbb-3(b)(1), unless the authorization is terminated or revoked.  Performed at [facility], [place] [address], [place], [place] [address]      Labs: BNP (last 3 results) Recent Labs    05/13/20 0719  BNP 616.0*   Basic Metabolic Panel: Recent Labs  Lab 05/15/20 0025 05/16/20 0059 05/17/20 0026 05/18/20 0022 05/19/20 0032  NA 137 133* 133* 134* 132*  K 4.9 4.1 3.8 3.6 3.9  CL 105 102 106 106 105  CO2 28 25 20* 24 23  GLUCOSE 110* 110* 101* 112* 121*  BUN 10 11 13 14 15   CREATININE 0.53 0.55 0.52 0.59 0.60  CALCIUM 8.0* 8.0* 7.8* 7.8* 8.0*  MG 1.8 1.8 1.8 1.9 1.9   Liver Function Tests: Recent Labs  Lab 05/13/20 0719 05/14/20 0042  AST 30 27  ALT 30 30  ALKPHOS 69 64  BILITOT 1.1 1.5*  PROT 5.1* 4.6*  ALBUMIN 2.5* 2.2*   No results for input(s): LIPASE, AMYLASE in the last 168 hours. No results for input(s): AMMONIA in the last 168 hours. CBC: Recent Labs  Lab 05/13/20 0719 05/14/20 0042 05/15/20 0025 05/16/20 0059  05/17/20 0026 05/18/20 0022 05/19/20 0032  WBC 9.1   < > 6.2 6.9 7.8 9.5 8.1  NEUTROABS 6.7  --   --   --   --   --   --   HGB 14.6   < > 12.9 12.7 12.5 11.7* 11.4*  HCT 43.2   < > 36.9 36.2 36.8 34.0* 33.9*  MCV 97.5   < > 96.6 96.5 97.6 97.1 98.5  PLT 220   < > 199 200 198 251 236   < > = values in this interval not displayed.   Cardiac Enzymes: Recent Labs  Lab  05/13/20 0719 05/14/20 0817  CKTOTAL 32* 55   BNP: Invalid input(s): POCBNP CBG: No results for input(s): GLUCAP in the last 168 hours. D-Dimer No results for input(s): DDIMER in the last 72 hours. Hgb A1c No results for input(s): HGBA1C in the last 72 hours. Lipid Profile No results for input(s): CHOL, HDL, LDLCALC, TRIG, CHOLHDL, LDLDIRECT in the last 72 hours. Thyroid function studies No results for input(s): TSH, T4TOTAL, T3FREE, THYROIDAB in the last 72 hours.  Invalid input(s): FREET3 Anemia work up No results for input(s): VITAMINB12, FOLATE, FERRITIN, TIBC, IRON, RETICCTPCT in the last 72 hours. Urinalysis    Component Value Date/Time   COLORURINE YELLOW 05/13/2020 1011   APPEARANCEUR HAZY (A) 05/13/2020 1011   LABSPEC 1.008 05/13/2020 1011   PHURINE 6.0 05/13/2020 1011   GLUCOSEU NEGATIVE 05/13/2020 1011   HGBUR NEGATIVE 05/13/2020 1011   HGBUR negative 08/19/2008 0000   BILIRUBINUR NEGATIVE 05/13/2020 1011   BILIRUBINUR n 08/12/2013 1700   KETONESUR NEGATIVE 05/13/2020 1011   PROTEINUR NEGATIVE 05/13/2020 1011   UROBILINOGEN 0.2 08/12/2013 1700   UROBILINOGEN 1.0 04/17/2010 1448   NITRITE POSITIVE (A) 05/13/2020 1011   LEUKOCYTESUR MODERATE (A) 05/13/2020 1011   Sepsis Labs Invalid input(s): PROCALCITONIN,  WBC,  LACTICIDVEN Microbiology Recent Results (from the past 240 hour(s))  Urine culture     Status: Abnormal   Collection Time: 05/13/20 10:11 AM   Specimen: Urine, Clean Catch  Result Value Ref Range Status   Specimen Description   Final    URINE, CLEAN CATCH Performed at [facility], [place] [address], [place], [place] [address]    Special Requests   Final    NONE Performed at [facility], [place] [address] [place], [place] [address]    Culture >=100,000 COLONIES/mL ESCHERICHIA COLI (A)  Final   Report Status 05/15/2020 FINAL  Final   Organism ID, Bacteria ESCHERICHIA COLI (A)  Final      Susceptibility    Escherichia coli - MIC*    AMPICILLIN 8 SENSITIVE Sensitive     CEFAZOLIN <=4 SENSITIVE Sensitive     CEFEPIME <=0.12 SENSITIVE Sensitive     CEFTRIAXONE <=0.25 SENSITIVE Sensitive     CIPROFLOXACIN <=0.25 SENSITIVE Sensitive     GENTAMICIN <=1 SENSITIVE Sensitive     IMIPENEM <=0.25 SENSITIVE Sensitive     NITROFURANTOIN <=16 SENSITIVE Sensitive     TRIMETH/SULFA <=20 SENSITIVE Sensitive     AMPICILLIN/SULBACTAM <=2 SENSITIVE Sensitive     PIP/TAZO <=4 SENSITIVE Sensitive     * >=100,000 COLONIES/mL ESCHERICHIA COLI  Resp Panel by RT-PCR (Flu A&B, Covid) Nasopharyngeal Swab     Status: None   Collection Time: 05/13/20 11:31 AM   Specimen: Nasopharyngeal Swab; Nasopharyngeal(NP) swabs in vial transport medium  Result Value Ref Range Status   SARS Coronavirus 2 by RT PCR NEGATIVE NEGATIVE Final  Comment: (NOTE) SARS-CoV-2 target nucleic acids are NOT DETECTED.  The SARS-CoV-2 RNA is generally detectable in upper respiratory specimens during the acute phase of infection. The lowest concentration of SARS-CoV-2 viral copies this assay can detect is 138 copies/mL. A negative result does not preclude SARS-Cov-2 infection and should not be used as the sole basis for treatment or other patient management decisions. A negative result may occur with  improper specimen collection/handling, submission of specimen other than nasopharyngeal swab, presence of viral mutation(s) within the areas targeted by this assay, and inadequate number of viral copies(<138 copies/mL). A negative result must be combined with clinical observations, patient history, and epidemiological information. The expected result is Negative.  Fact Sheet for Patients:  EntrepreneurPulse.com.au  Fact Sheet for Healthcare Providers:  IncredibleEmployment.be  This test is no t yet approved or cleared by the [place] FDA and  has been authorized for detection and/or diagnosis of  SARS-CoV-2 by FDA under an Emergency Use Authorization (EUA). This EUA will remain  in effect (meaning this test can be used) for the duration of the COVID-19 declaration under Section 564(b)(1) of the Act, 21 U.S.C.section 360bbb-3(b)(1), unless the authorization is terminated  or revoked sooner.       Influenza A by PCR NEGATIVE NEGATIVE Final   Influenza B by PCR NEGATIVE NEGATIVE Final    Comment: (NOTE) The Xpert Xpress SARS-CoV-2/FLU/RSV plus assay is intended as an aid in the diagnosis of influenza from Nasopharyngeal swab specimens and should not be used as a sole basis for treatment. Nasal washings and aspirates are unacceptable for Xpert Xpress SARS-CoV-2/FLU/RSV testing.  Fact Sheet for Patients: EntrepreneurPulse.com.au  Fact Sheet for Healthcare Providers: IncredibleEmployment.be  This test is not yet approved or cleared by the [place] FDA and has been authorized for detection and/or diagnosis of SARS-CoV-2 by FDA under an Emergency Use Authorization (EUA). This EUA will remain in effect (meaning this test can be used) for the duration of the COVID-19 declaration under Section 564(b)(1) of the Act, 21 U.S.C. section 360bbb-3(b)(1), unless the authorization is terminated or revoked.  Performed at [facility], [place] [address] [place], [place] [address]   MRSA PCR Screening     Status: None   Collection Time: 05/13/20 11:08 PM   Specimen: Nasopharyngeal  Result Value Ref Range Status   MRSA by PCR NEGATIVE NEGATIVE Final    Comment:        The GeneXpert MRSA Assay (FDA approved for NASAL specimens only), is one component of a comprehensive MRSA colonization surveillance program. It is not intended to diagnose MRSA infection nor to guide or monitor treatment for MRSA infections. Performed at [facility], [place] [address], [place], [place] [address]   Resp Panel by RT-PCR (Flu A&B, Covid)  Nasopharyngeal Swab     Status: None   Collection Time: 05/17/20  2:06 PM   Specimen: Nasopharyngeal Swab; Nasopharyngeal(NP) swabs in vial transport medium  Result Value Ref Range Status   SARS Coronavirus 2 by RT PCR NEGATIVE NEGATIVE Final    Comment: (NOTE) SARS-CoV-2 target nucleic acids are NOT DETECTED.  The SARS-CoV-2 RNA is generally detectable in upper respiratory specimens during the acute phase of infection. The lowest concentration of SARS-CoV-2 viral copies this assay can detect is 138 copies/mL. A negative result does not preclude SARS-Cov-2 infection and should not be used as the sole basis for treatment or other patient management decisions. A negative result may occur with  improper specimen collection/handling, submission of  specimen other than nasopharyngeal swab, presence of viral mutation(s) within the areas targeted by this assay, and inadequate number of viral copies(<138 copies/mL). A negative result must be combined with clinical observations, patient history, and epidemiological information. The expected result is Negative.  Fact Sheet for Patients:  EntrepreneurPulse.com.au  Fact Sheet for Healthcare Providers:  IncredibleEmployment.be  This test is no t yet approved or cleared by the [place] FDA and  has been authorized for detection and/or diagnosis of SARS-CoV-2 by FDA under an Emergency Use Authorization (EUA). This EUA will remain  in effect (meaning this test can be used) for the duration of the COVID-19 declaration under Section 564(b)(1) of the Act, 21 U.S.C.section 360bbb-3(b)(1), unless the authorization is terminated  or revoked sooner.       Influenza A by PCR NEGATIVE NEGATIVE Final   Influenza B by PCR NEGATIVE NEGATIVE Final    Comment: (NOTE) The Xpert Xpress SARS-CoV-2/FLU/RSV plus assay is intended as an aid in the diagnosis of influenza from Nasopharyngeal swab specimens and should not be  used as a sole basis for treatment. Nasal washings and aspirates are unacceptable for Xpert Xpress SARS-CoV-2/FLU/RSV testing.  Fact Sheet for Patients: EntrepreneurPulse.com.au  Fact Sheet for Healthcare Providers: IncredibleEmployment.be  This test is not yet approved or cleared by the [place] FDA and has been authorized for detection and/or diagnosis of SARS-CoV-2 by FDA under an Emergency Use Authorization (EUA). This EUA will remain in effect (meaning this test can be used) for the duration of the COVID-19 declaration under Section 564(b)(1) of the Act, 21 U.S.C. section 360bbb-3(b)(1), unless the authorization is terminated or revoked.  Performed at [facility], [place] [address], [place], [place] [address]       If 7PM-7AM, please contact night-coverage

## 2020-05-19 NOTE — TOC Progression Note (Signed)
Transition of Care [facility]) - Progression Note    Patient Details  Name: Becky [name] MRN: [id number] Date of Birth: 02/16/1930  Transition of Care [facility]) CM/SW [place], [place] Phone Number: 05/19/2020, 12:09 PM  Clinical Narrative:    MD did peer to peer for [facility] the request was denied, pt will go back to the facility as a LTC resident.    Expected Discharge Plan: Skilled Nursing Facility Barriers to Discharge: Continued Medical Work up  Expected Discharge Plan and Services Expected Discharge Plan: [place]         Expected Discharge Date: 05/17/20                                     Social Determinants of Health (SDOH) Interventions    Readmission Risk Interventions No flowsheet data found.

## 2020-05-19 NOTE — Progress Notes (Addendum)
Patient awaiting return to [facility] facility.  Will d/c lisinopril. Eulogio Bear DO

## 2020-05-19 NOTE — Plan of Care (Signed)
  Problem: Clinical Measurements: Goal: Ability to maintain clinical measurements within normal limits will improve Outcome: Progressing   Problem: Elimination: Goal: Will not experience complications related to urinary retention Outcome: Progressing   Problem: Pain Managment: Goal: General experience of comfort will improve Outcome: Progressing   Problem: Safety: Goal: Ability to remain free from injury will improve Outcome: Progressing   Problem: Skin Integrity: Goal: Risk for impaired skin integrity will decrease Outcome: Progressing

## 2020-05-19 NOTE — Progress Notes (Signed)
Gave a report to RN at [facility]. Informed patient's daughter that patient is going to [facility] today. Removed PIV access. [facility] is aware of patient should be there before 9 PM. HS Glynis Smiles

## 2020-05-19 NOTE — TOC Transition Note (Addendum)
Transition of Care [facility]) - CM/SW Discharge Note   Patient Details  Name: Becky [name] MRN: [id number] Date of Birth: 1929-11-05  Transition of Care [facility]) CM/SW Contact:  Tresa Endo Phone Number: 05/19/2020, 3:56 PM   Clinical Narrative:    Patient will DC to: [facility] Anticipated DC date: 05/20/1931 Family notified: Merrily Pew (son) Transport by: Corey Harold   Per MD patient ready for DC to [facility] room 304B. RN to call report prior to discharge [phone] ). RN, patient, patient's family, and facility notified of DC. Discharge Summary and FL2 sent to facility. DC packet on chart. Ambulance transport requested for patient.   CSW will sign off for now as social work intervention is no longer needed. Please consult [place] again if new needs arise.     Final next level of care: Skilled Nursing Facility Barriers to Discharge: Continued Medical Work up   Patient Goals and CMS Choice Patient states their goals for this hospitalization and ongoing recovery are:: Rehab CMS Medicare.gov Compare Post Acute Care list provided to:: Patient Choice offered to / list presented to : Patient  Discharge Placement                       Discharge Plan and Services                                     Social Determinants of Health (SDOH) Interventions     Readmission Risk Interventions No flowsheet data found.

## 2020-06-03 DIAGNOSIS — I429 Cardiomyopathy, unspecified: Secondary | ICD-10-CM | POA: Insufficient documentation

## 2020-06-03 NOTE — Progress Notes (Addendum)
Cardiology Office Note   Date:  06/04/2020   ID:  Becky [name], DOB 06/03/1929, MRN [id number]  PCP:  Josetta Huddle, MD  Cardiologist:   No primary care provider on file.   Chief Complaint  Patient presents with  . Cardiomyopathy      History of Present Illness: Becky [name] is a 87 y.o. female who presents for evaluation of a reduced EF.  Thi was found last month on an echo.  The EF was 30 - 35%.   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   This is being managed medically.  She had a PE noted when she was seen recently in the hospital after a fall.  She was treated with Eliquis.     She has significant dementia.  She lives at a nursing home.  She does do some walking with physical therapy with a walker.  She denies any cardiovascular symptoms and her family does not witness any.  She does not have shortness of breath, PND or orthopnea.  She does not have palpitations, presyncope or syncope.  She had no chest pressure, neck or arm discomfort.  She had a little lower extremity swelling on the left leg.   Past Medical History:  Diagnosis Date  . Arthritis   . COPD (chronic obstructive pulmonary disease) ([place])   . Dementia ([place])   . Depression    and anxiety  . Fibromyalgia   . Fuchs' corneal dystrophy    L eye  . Glaucoma    R eye  . Hyperlipidemia   . Migraines   . Osteoporosis   . Polio    PPS  (age 53 yr)  . Scoliosis   . Ulcer    small- stomach    Past Surgical History:  Procedure Laterality Date  . BREAST LUMPECTOMY WITH RADIOACTIVE SEED LOCALIZATION Right 07/15/2019   Procedure: RADIOACTIVE SEED GUIDED RIGHT BREAST LUMPECTOMY;  Surgeon: Erroll Luna, MD;  Location: [facility];  Service: General;  Laterality: Right;  . inguinal herniorrhaphy    . TUBAL LIGATION       Current Outpatient Medications  Medication Sig Dispense Refill  . acetaminophen (TYLENOL) 500 MG tablet Take 500 mg by mouth every 6 (six) hours as  needed (for pain).    Marland Kitchen anastrozole (ARIMIDEX) 1 MG tablet Take 1 tablet (1 mg total) by mouth daily. 90 tablet 4  . apixaban (ELIQUIS) 5 MG TABS tablet Take 1 tablet (5 mg total) by mouth 2 (two) times daily. 60 tablet   . atorvastatin (LIPITOR) 20 MG tablet Take 1 tablet (20 mg total) by mouth daily.    . carvedilol (COREG) 3.125 MG tablet Take 1 tablet (3.125 mg total) by mouth 2 (two) times daily with a meal.    . Cholecalciferol (VITAMIN D3) 125 MCG (5000 UT) TABS Take 10,000 Units by mouth every Tuesday.     . cholestyramine (QUESTRAN) 4 g packet Take 1 packet by mouth in the morning and at bedtime. Mix with 8-oz of fluid    . cyanocobalamin (,VITAMIN B-12,) 1000 MCG/ML injection Inject 1,000 mcg into the muscle every 30 (thirty) days. On the 21st of each month    . HYDROcodone-acetaminophen (NORCO/VICODIN) 5-325 MG tablet Take 1-2 tablets by mouth every 4 (four) hours as needed for moderate pain. 30 tablet 0  . lisinopril (ZESTRIL) 5 MG tablet Take 1 tablet (5 mg total) by mouth daily.    Marland Kitchen loperamide (IMODIUM A-D) 2  MG tablet Take 4 mg by mouth daily as needed for diarrhea or loose stools.    . Multiple Vitamin (MULTIVITAMIN WITH MINERALS) TABS tablet Take 1 tablet by mouth daily.    . Nutritional Supplements (RA NUTRITIONAL SUPPORT PO) Take 120 mLs by mouth in the morning and at bedtime. Medpass    . pantoprazole (PROTONIX) 40 MG tablet Take 1 tablet (40 mg total) by mouth daily before breakfast.    . Polyvinyl Alcohol-Povidone (REFRESH OP) Place 1 drop into both eyes in the morning, at noon, in the evening, and at bedtime.     . sertraline (ZOLOFT) 50 MG tablet Take 50 mg by mouth daily.    . sodium chloride (MURO 128) 5 % ophthalmic ointment Place 1 application into the left eye in the morning and at bedtime.    . traMADol (ULTRAM) 50 MG tablet Take 1 tablet (50 mg total) by mouth every 6 (six) hours as needed for moderate pain. 15 tablet 0  . Travoprost, BAK Free, (TRAVATAN) 0.004 %  SOLN ophthalmic solution Place 1 drop into the right eye at bedtime.     No current facility-administered medications for this visit.    Allergies:   Brimonidine    Social History:  The patient  reports that she has never smoked. She has never used smokeless tobacco. She reports that she does not drink alcohol and does not use drugs.   Family History:  The patient's family history includes Alcohol abuse in an other family member; Arthritis in her brother and another family member; Cancer in her brother; Colon cancer in an other family member; Diabetes in an other family member; Psychosis in an other family member; Stroke in her father.    ROS:  Please see the history of present illness.   Otherwise, review of systems are positive for none.   All other systems are reviewed and negative.    PHYSICAL EXAM: VS:  BP (!) 110/50   Pulse 80   Ht 5\' 2"  (1.575 m)   Wt 117 lb 12.8 oz (53.4 kg)   SpO2 95%   BMI 21.55 kg/m  , BMI Body mass index is 21.55 kg/m. GENERAL: Frail appearing HEENT:  Pupils equal round and reactive, fundi not visualized, oral mucosa unremarkable NECK:  No jugular venous distention, waveform within normal limits, carotid upstroke brisk and symmetric, no bruits, no thyromegaly LYMPHATICS:  No cervical, inguinal adenopathy LUNGS:  Clear to auscultation bilaterally BACK:  No CVA tenderness CHEST:  Unremarkable HEART:  PMI not displaced or sustained,S1 and S2 within normal limits, no S3, no S4, no clicks, no rubs, no murmurs ABD:  Flat, positive bowel sounds normal in frequency in pitch, no bruits, no rebound, no guarding, no midline pulsatile mass, no hepatomegaly, no splenomegaly EXT:  2 plus pulses throughout, mild left leg edema, no cyanosis no clubbing, her right leg is chronically smaller secondary to polio SKIN:  No rashes no nodules NEURO:  Cranial nerves II through XII grossly intact, motor grossly intact throughout PSYCH:  Cognitively intact, oriented to person  place and time    EKG:  EKG is not.  Ordered today. The ekg ordered 05/13/2020 demonstrates sinus rhythm, rate 76, left bundle branch block, no acute ST-T wave changes   Recent Labs: 05/13/2020: B Natriuretic Peptide 127.7 05/14/2020: ALT 30; TSH 1.306 05/19/2020: BUN 15; Creatinine, Ser 0.60; Hemoglobin 11.4; Magnesium 1.9; Platelets 236; Potassium 3.9; Sodium 132    Lipid Panel    Component Value Date/Time  CHOL 169 05/15/2020 0114   TRIG 63 05/15/2020 0114   HDL 70 05/15/2020 0114   CHOLHDL 2.4 05/15/2020 0114   VLDL 13 05/15/2020 0114   LDLCALC 86 05/15/2020 0114   LDLDIRECT 119.6 10/24/2011 1034      Wt Readings from Last 3 Encounters:  06/04/20 117 lb 12.8 oz (53.4 kg)  05/13/20 111 lb 5.3 oz (50.5 kg)  08/15/19 125 lb 12.8 oz (57.1 kg)      Other studies Reviewed: Additional studies/ records that were reviewed today include:   CT, hospital records.  . Review of the above records demonstrates:  Please see elsewhere in the note.     ASSESSMENT AND PLAN:  PULMONARY EMBOLISM: She was not treated with 10 twice a day as far as I can tell the Eliquis but she is now beyond the 7 days so I will discontinue 5 twice a day and might consider stopping this in 3 months.  I will have her come back to discuss this at that point.  She will stop her aspirin.  CARDIOMYOPATHY: She has no symptoms.  I do not think she would tolerate med titration.  I talked with her daughter about this and we talked about as needed dosing of her diuretic if she needs in the future.  She can talk to either Dr. Inda Merlin to call us if she is having shortness of breath or increased swelling.  INTRAMURAL HEMATOMA:   This is being managed conservatively.  I do not think further imaging is indicated.  He very much wants conservative therapy.  Current medicines are reviewed at length with the patient today.  The patient does not have concerns regarding medicines.  The following changes have been made:  no  change  Labs/ tests ordered today include: None No orders of the defined types were placed in this encounter.    Disposition:   FU with me in 3 months.     Signed, Minus Breeding, MD  06/04/2020 11:54 AM    [facility]

## 2020-06-04 ENCOUNTER — Encounter: Payer: Self-pay | Admitting: Cardiology

## 2020-06-04 ENCOUNTER — Other Ambulatory Visit: Payer: Self-pay

## 2020-06-04 ENCOUNTER — Ambulatory Visit (INDEPENDENT_AMBULATORY_CARE_PROVIDER_SITE_OTHER): Payer: Medicare Other | Admitting: Cardiology

## 2020-06-04 VITALS — BP 110/50 | HR 80 | Ht 62.0 in | Wt 117.8 lb

## 2020-06-04 DIAGNOSIS — I428 Other cardiomyopathies: Secondary | ICD-10-CM

## 2020-06-04 DIAGNOSIS — I2699 Other pulmonary embolism without acute cor pulmonale: Secondary | ICD-10-CM | POA: Diagnosis not present

## 2020-06-04 NOTE — Patient Instructions (Signed)
Medication Instructions:  STOP- Aspirin  *If you need a refill on your cardiac medications before your next appointment, please call your pharmacy*   Lab Work: None Ordered   Testing/Procedures: None ordered   Follow-Up: At [facility], you and your health needs are our priority.  As part of our continuing mission to provide you with exceptional heart care, we have created designated Provider Care Teams.  These Care Teams include your primary Cardiologist (physician) and Advanced Practice Providers (APPs -  Physician Assistants and Nurse Practitioners) who all work together to provide you with the care you need, when you need it.  We recommend signing up for the patient portal called "MyChart".  Sign up information is provided on this After Visit Summary.  MyChart is used to connect with patients for Virtual Visits (Telemedicine).  Patients are able to view lab/test results, encounter notes, upcoming appointments, etc.  Non-urgent messages can be sent to your provider as well.   To learn more about what you can do with MyChart, go to [email or web address].    Your next appointment:   3 month(s)  The format for your next appointment:   In Person  Provider:   You may see Minus Breeding, MD or one of the following Advanced Practice Providers on your designated Care Team:    Rosaria Ferries, PA-C  Jory Sims, DNP, ANP

## 2020-06-18 ENCOUNTER — Other Ambulatory Visit: Payer: Self-pay | Admitting: *Deleted

## 2020-06-18 DIAGNOSIS — I7101 Dissection of thoracic aorta: Secondary | ICD-10-CM

## 2020-06-18 DIAGNOSIS — I71019 Dissection of thoracic aorta, unspecified: Secondary | ICD-10-CM

## 2020-07-14 ENCOUNTER — Ambulatory Visit
Admission: RE | Admit: 2020-07-14 | Discharge: 2020-07-14 | Disposition: A | Discharge status: Home / Self Care | Payer: Medicare Other | Source: Ambulatory Visit | Attending: Vascular Surgery | Admitting: Vascular Surgery

## 2020-07-14 DIAGNOSIS — I71019 Dissection of thoracic aorta, unspecified: Secondary | ICD-10-CM

## 2020-07-14 DIAGNOSIS — I7101 Dissection of thoracic aorta: Secondary | ICD-10-CM

## 2020-07-14 MED ORDER — IOPAMIDOL (ISOVUE-370) INJECTION 76%
75.0000 mL | Freq: Once | INTRAVENOUS | Status: AC | PRN
  Administered 2020-07-14: 75 mL via INTRAVENOUS

## 2020-07-16 ENCOUNTER — Ambulatory Visit (INDEPENDENT_AMBULATORY_CARE_PROVIDER_SITE_OTHER): Payer: Medicare Other | Admitting: Vascular Surgery

## 2020-07-16 ENCOUNTER — Encounter: Payer: Self-pay | Admitting: Vascular Surgery

## 2020-07-16 ENCOUNTER — Other Ambulatory Visit: Payer: Self-pay

## 2020-07-16 VITALS — BP 72/47 | HR 107 | Temp 98.2°F | Resp 16 | Ht 62.0 in | Wt 117.0 lb

## 2020-07-16 DIAGNOSIS — I7101 Dissection of thoracic aorta: Secondary | ICD-10-CM | POA: Diagnosis not present

## 2020-07-16 DIAGNOSIS — I71019 Dissection of thoracic aorta, unspecified: Secondary | ICD-10-CM

## 2020-07-16 NOTE — Progress Notes (Signed)
Patient ID: Becky [name], female   DOB: 1929/12/01, 87 y.o.   MRN: [id number]  Reason for Consult: Follow-up   Referred by No ref. provider found  Subjective:     HPI:  Becky [name] is a 87 y.o. female history of traumatic aortic IMH.  This was in March.  We actually did not know the time course although we assumed that this was acute.  She has no new back or abdominal pain.  She is mostly in her wheelchair throughout the day.  Chief complaint today is actually leg swelling.  No new back or chest pain.  Past Medical History:  Diagnosis Date  . Arthritis   . COPD (chronic obstructive pulmonary disease) ([place])   . Dementia ([place])   . Depression    and anxiety  . Fibromyalgia   . Fuchs' corneal dystrophy    L eye  . Glaucoma    R eye  . Hyperlipidemia   . Migraines   . Osteoporosis   . Polio    PPS  (age 51 yr)  . Scoliosis   . Ulcer    small- stomach   Family History  Problem Relation Age of Onset  . Arthritis Brother   . Cancer Brother        esophageal  . Alcohol abuse Other        addiction   . Arthritis Other   . Colon cancer Other        1st degree relative <60  . Diabetes Other        1st degree relative  . Psychosis Other   . Stroke Father    Past Surgical History:  Procedure Laterality Date  . BREAST LUMPECTOMY WITH RADIOACTIVE SEED LOCALIZATION Right 07/15/2019   Procedure: RADIOACTIVE SEED GUIDED RIGHT BREAST LUMPECTOMY;  Surgeon: Erroll Luna, MD;  Location: [facility];  Service: General;  Laterality: Right;  . inguinal herniorrhaphy    . TUBAL LIGATION      Short Social History:  Social History   Tobacco Use  . Smoking status: Never Smoker  . Smokeless tobacco: Never Used  Substance Use Topics  . Alcohol use: No    Allergies  Allergen Reactions  . Brimonidine     Unknown - Listed on MAR    Current Outpatient Medications  Medication Sig Dispense Refill  . acetaminophen (TYLENOL) 500 MG tablet Take 500 mg by mouth every 6 (six) hours  as needed (for pain).    Marland Kitchen anastrozole (ARIMIDEX) 1 MG tablet Take 1 tablet (1 mg total) by mouth daily. 90 tablet 4  . apixaban (ELIQUIS) 5 MG TABS tablet Take 1 tablet (5 mg total) by mouth 2 (two) times daily. 60 tablet   . atorvastatin (LIPITOR) 20 MG tablet Take 1 tablet (20 mg total) by mouth daily.    . carvedilol (COREG) 3.125 MG tablet Take 1 tablet (3.125 mg total) by mouth 2 (two) times daily with a meal.    . Cholecalciferol (VITAMIN D3) 125 MCG (5000 UT) TABS Take 10,000 Units by mouth every Tuesday.     . cyanocobalamin (,VITAMIN B-12,) 1000 MCG/ML injection Inject 1,000 mcg into the muscle every 30 (thirty) days. On the 21st of each month    . divalproex (DEPAKOTE SPRINKLE) 125 MG capsule Take 125 mg by mouth 2 (two) times daily.    . furosemide (LASIX) 20 MG tablet Take 20 mg by mouth daily.    Marland Kitchen HYDROcodone-acetaminophen (NORCO/VICODIN) 5-325 MG tablet Take 1-2 tablets by mouth  every 4 (four) hours as needed for moderate pain. 30 tablet 0  . lisinopril (ZESTRIL) 5 MG tablet Take 1 tablet (5 mg total) by mouth daily.    Marland Kitchen loperamide (IMODIUM A-D) 2 MG tablet Take 4 mg by mouth daily as needed for diarrhea or loose stools.    . Multiple Vitamin (MULTIVITAMIN WITH MINERALS) TABS tablet Take 1 tablet by mouth daily.    . Nutritional Supplements (RA NUTRITIONAL SUPPORT PO) Take 120 mLs by mouth in the morning and at bedtime. Medpass    . pantoprazole (PROTONIX) 40 MG tablet Take 1 tablet (40 mg total) by mouth daily before breakfast.    . Polyvinyl Alcohol-Povidone (REFRESH OP) Place 1 drop into both eyes in the morning, at noon, in the evening, and at bedtime.     . potassium chloride SA (KLOR-CON) 20 MEQ tablet Take by mouth.    . sertraline (ZOLOFT) 50 MG tablet Take 50 mg by mouth daily.    . sodium chloride (MURO 128) 5 % ophthalmic ointment Place 1 application into the left eye in the morning and at bedtime.    . traMADol (ULTRAM) 50 MG tablet Take 1 tablet (50 mg total) by mouth  every 6 (six) hours as needed for moderate pain. 15 tablet 0  . Travoprost, BAK Free, (TRAVATAN) 0.004 % SOLN ophthalmic solution Place 1 drop into the right eye at bedtime.    . cholestyramine light (PREVALITE) 4 g packet Take 1 packet by mouth 2 (two) times daily. (Patient not taking: Reported on 07/16/2020)     No current facility-administered medications for this visit.    Review of Systems  Constitutional:  Constitutional negative. HENT: HENT negative.  Eyes: Eyes negative.  Respiratory: Respiratory negative.  Cardiovascular: Positive for leg swelling.  GI: Gastrointestinal negative.  Musculoskeletal: Musculoskeletal negative.  Skin: Skin negative.  Neurological: Neurological negative. Hematologic: Hematologic/lymphatic negative.  Psychiatric: Psychiatric negative.        Objective:  Objective   Vitals:   07/16/20 1340  BP: (!) 72/47  Pulse: (!) 107  Resp: 16  Temp: 98.2 F (36.8 C)  SpO2: 94%  Weight: 117 lb (53.1 kg)  Height: 5\' 2"  (1.575 m)   Body mass index is 21.4 kg/m.  Physical Exam HENT:     Head: Normocephalic.     Nose:     Comments: Wearing a mask Eyes:     Pupils: Pupils are equal, round, and reactive to light.  Cardiovascular:     Pulses:          Radial pulses are 2+ on the right side and 2+ on the left side.       Femoral pulses are 2+ on the right side and 2+ on the left side. Pulmonary:     Effort: Pulmonary effort is normal.  Abdominal:     General: Abdomen is flat.     Palpations: Abdomen is soft.  Musculoskeletal:        General: Normal range of motion.     Right lower leg: Edema present.     Left lower leg: Edema present.  Skin:    General: Skin is warm.     Capillary Refill: Capillary refill takes less than 2 seconds.  Neurological:     General: No focal deficit present.     Mental Status: She is alert.  Psychiatric:        Behavior: Behavior normal.        Thought Content: Thought content normal.  Judgment: Judgment  normal.     Data: CT IMPRESSION: VASCULAR  1. Persistent and minimally decreased size of previously visualized proximal descending thoracic aortic eccentric mural thrombus. No evidence of distal emboli in the chest, abdomen, or pelvis. 2. No evidence of central pulmonary embolism. Due to differences in contrast bolus timing, this study is insufficient to evaluate for resolution of previously visualized lobar and segmental right pulmonary emboli. No right heart strain or evidence of pulmonary infarction.     Assessment/Plan:     87 year old female with previous considered to be traumatic thoracic IMH.  This at least appears stable today on CT.  I discussed further follow-up with the daughter she would rather just call as needed which is certainly reasonable.  No further CTs were scheduled.     Waynetta Sandy MD [facility]

## 2020-09-01 NOTE — Progress Notes (Signed)
Cardiology Office Note   Date:  09/02/2020   ID:  [name], DOB Aug 30, 1929, MRN [id number]  PCP:  Josetta Huddle, MD  Cardiologist:   None   Chief Complaint  Patient presents with   Edema       History of Present Illness: Becky [name] is a 87 y.o. female who presents for evaluation of a reduced EF.  This was found last month on an echo.  The EF was 30 - 35%.   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   This is being managed medically.  She had a PE noted when she was seen recently in the hospital after a fall.  She was treated with Eliquis.     She has significant dementia.  She lives at a nursing home.  Since I last saw her she had low blood pressures and her lisinopril and carvedilol was stopped by the physician and her nursing home.   She gets around slowly.  She has had some increased lower extremity swelling but she has not had any new shortness of breath, PND or orthopnea.  He did not weigh her because it is difficult for her to get up on a scale.  She is very frail.  She does not have any complaints and her family does not notice her complaining of chest discomfort, neck or arm discomfort.     Past Medical History:  Diagnosis Date   Arthritis    COPD (chronic obstructive pulmonary disease) ([facility])    Dementia ([facility])    Depression    and anxiety   Fibromyalgia    Fuchs' corneal dystrophy    L eye   Glaucoma    R eye   Hyperlipidemia    Migraines    Osteoporosis    Polio    PPS  (age 10 yr)   Scoliosis    Ulcer    small- stomach    Past Surgical History:  Procedure Laterality Date   BREAST LUMPECTOMY WITH RADIOACTIVE SEED LOCALIZATION Right 07/15/2019   Procedure: RADIOACTIVE SEED GUIDED RIGHT BREAST LUMPECTOMY;  Surgeon: Erroll Luna, MD;  Location: [facility];  Service: General;  Laterality: Right;   inguinal herniorrhaphy     TUBAL LIGATION       Current Outpatient Medications  Medication Sig Dispense Refill    apixaban (ELIQUIS) 2.5 MG TABS tablet Take 1 tablet (2.5 mg total) by mouth 2 (two) times daily. 60 tablet 11   acetaminophen (TYLENOL) 500 MG tablet Take 500 mg by mouth every 6 (six) hours as needed (for pain).     anastrozole (ARIMIDEX) 1 MG tablet Take 1 tablet (1 mg total) by mouth daily. 90 tablet 4   atorvastatin (LIPITOR) 20 MG tablet Take 1 tablet (20 mg total) by mouth daily.     Cholecalciferol (VITAMIN D3) 125 MCG (5000 UT) TABS Take 10,000 Units by mouth every Tuesday.      cyanocobalamin (,VITAMIN B-12,) 1000 MCG/ML injection Inject 1,000 mcg into the muscle every 30 (thirty) days. On the 21st of each month     divalproex (DEPAKOTE SPRINKLE) 125 MG capsule Take 125 mg by mouth 2 (two) times daily.     furosemide (LASIX) 20 MG tablet Take 20 mg by mouth daily.     HYDROcodone-acetaminophen (NORCO/VICODIN) 5-325 MG tablet Take 1-2 tablets by mouth every 4 (four) hours as needed for moderate pain. 30 tablet 0   loperamide (IMODIUM A-D) 2 MG tablet Take 4  mg by mouth daily as needed for diarrhea or loose stools.     Multiple Vitamin (MULTIVITAMIN WITH MINERALS) TABS tablet Take 1 tablet by mouth daily.     Nutritional Supplements (RA NUTRITIONAL SUPPORT PO) Take 120 mLs by mouth in the morning and at bedtime. Medpass     pantoprazole (PROTONIX) 40 MG tablet Take 1 tablet (40 mg total) by mouth daily before breakfast.     Polyvinyl Alcohol-Povidone (REFRESH OP) Place 1 drop into both eyes in the morning, at noon, in the evening, and at bedtime.      potassium chloride SA (KLOR-CON) 20 MEQ tablet Take by mouth.     sertraline (ZOLOFT) 50 MG tablet Take 50 mg by mouth daily.     sodium chloride (MURO 128) 5 % ophthalmic ointment Place 1 application into the left eye in the morning and at bedtime.     traMADol (ULTRAM) 50 MG tablet Take 1 tablet (50 mg total) by mouth every 6 (six) hours as needed for moderate pain. 15 tablet 0   Travoprost, BAK Free, (TRAVATAN) 0.004 % SOLN ophthalmic  solution Place 1 drop into the right eye at bedtime.     No current facility-administered medications for this visit.    Allergies:   Brimonidine    ROS:  Please see the history of present illness.   Otherwise, review of systems are positive for none (difficult to assess with her dementia.)   All other systems are reviewed and negative.    PHYSICAL EXAM: VS:  BP (!) 107/6   Pulse 88   SpO2 95%  , BMI There is no height or weight on file to calculate BMI. GEN:  No distress, very frail appearing NECK:  No jugular venous distention at 90 degrees, waveform within normal limits, carotid upstroke brisk and symmetric, no bruits, no thyromegaly LYMPHATICS:  No cervical adenopathy LUNGS:  Clear to auscultation bilaterally BACK:  No CVA tenderness CHEST:  Unremarkable HEART:  S1 and S2 within normal limits, no S3, no S4, no clicks, no rubs, no murmurs ABD:  Positive bowel sounds normal in frequency in pitch, no bruits, no rebound, no guarding, unable to assess midline mass or bruit with the patient seated. EXT:  2 plus pulses throughout, moderate left greater than right leg edema, no cyanosis no clubbing SKIN:  No rashes no nodules NEURO:  Cranial nerves II through XII grossly intact, motor grossly intact throughout PSYCH: Oriented to person but otherwise slightly confused but pleasant   EKG:  EKG is not ordered today.   Recent Labs: 05/13/2020: B Natriuretic Peptide 127.7 05/14/2020: ALT 30; TSH 1.306 05/19/2020: BUN 15; Creatinine, Ser 0.60; Hemoglobin 11.4; Magnesium 1.9; Platelets 236; Potassium 3.9; Sodium 132    Lipid Panel    Component Value Date/Time   CHOL 169 05/15/2020 0114   TRIG 63 05/15/2020 0114   HDL 70 05/15/2020 0114   CHOLHDL 2.4 05/15/2020 0114   VLDL 13 05/15/2020 0114   LDLCALC 86 05/15/2020 0114   LDLDIRECT 119.6 10/24/2011 1034      Wt Readings from Last 3 Encounters:  07/16/20 117 lb (53.1 kg)  06/04/20 117 lb 12.8 oz (53.4 kg)  05/13/20 111 lb 5.3 oz  (50.5 kg)      Other studies Reviewed: Additional studies/ records that were reviewed today include:   Records from her nursing home  . Review of the above records demonstrates:  Please see elsewhere in the note.     ASSESSMENT AND PLAN:  PULMONARY EMBOLISM:  I talked with her daughter today and with her mobility and the fact that her pulmonary embolism was probably unprovoked and related to the mobility issues I am going to keep her on a low-dose of 2.5 mg twice daily Eliquis.  We discussed the risks of bleeding versus recurrent thrombosis and I would think that her thrombosis risk is higher than her bleeding risk she has tolerated a higher dose of Eliquis to date.   CARDIOMYOPATHY:   She does have some lower extremity swelling but no left-sided heart failure.  We talked about keeping her feet elevated and conservative measures rather than increasing her Lasix.  However, if she has shortness of breath she certainly could have as needed increased dosing of her diuretic prn .  She does not tolerate medications for afterload reduction as mentioned above.  INTRAMURAL HEMATOMA:   She did have a CT and the size of her mural thrombus was lower.  Study was not sufficient to judge any pulmonary emboli chronic burden or new.   Given her frailty we are following this conservatively.  Current medicines are reviewed at length with the patient today.  The patient does not have concerns regarding medicines.  The following changes have been made:   As above  Labs/ tests ordered today include:   None No orders of the defined types were placed in this encounter.    Disposition:   FU with me as needed.   Signed, Minus Breeding, MD  09/02/2020 11:36 AM    [place]

## 2020-09-02 ENCOUNTER — Ambulatory Visit (INDEPENDENT_AMBULATORY_CARE_PROVIDER_SITE_OTHER): Payer: Medicare Other | Admitting: Cardiology

## 2020-09-02 ENCOUNTER — Encounter: Payer: Self-pay | Admitting: Cardiology

## 2020-09-02 ENCOUNTER — Other Ambulatory Visit: Payer: Self-pay

## 2020-09-02 VITALS — BP 107/6 | HR 88

## 2020-09-02 DIAGNOSIS — I2699 Other pulmonary embolism without acute cor pulmonale: Secondary | ICD-10-CM | POA: Diagnosis not present

## 2020-09-02 DIAGNOSIS — I428 Other cardiomyopathies: Secondary | ICD-10-CM | POA: Diagnosis not present

## 2020-09-02 DIAGNOSIS — I71019 Dissection of thoracic aorta, unspecified: Secondary | ICD-10-CM

## 2020-09-02 DIAGNOSIS — I7101 Dissection of thoracic aorta: Secondary | ICD-10-CM | POA: Diagnosis not present

## 2020-09-02 MED ORDER — APIXABAN 2.5 MG PO TABS: 2.5000 mg | ORAL_TABLET | Freq: Two times a day (BID) | ORAL | 11 refills | Status: DC

## 2020-09-02 NOTE — Patient Instructions (Addendum)
Medication Instructions:  DECREASE YOUR ELIQUIS TO 2.5 MG TWICE A DAY   Labwork: NONE  Testing/Procedures: NONE  Follow-Up:  AS NEEDED

## 2020-09-04 ENCOUNTER — Encounter (HOSPITAL_COMMUNITY): Payer: Self-pay

## 2021-03-30 DEATH — deceased
